# Patient Record
Sex: Male | Born: 1945
Health system: Southern US, Community
[De-identification: ages and names within clinical notes are randomized; demographics above are authoritative.]

## PROBLEM LIST (undated history)

## (undated) DIAGNOSIS — K859 Acute pancreatitis without necrosis or infection, unspecified: Secondary | ICD-10-CM

## (undated) DIAGNOSIS — D4959 Neoplasm of unspecified behavior of other genitourinary organ: Secondary | ICD-10-CM

## (undated) DIAGNOSIS — C6290 Malignant neoplasm of unspecified testis, unspecified whether descended or undescended: Secondary | ICD-10-CM

## (undated) DIAGNOSIS — C439 Malignant melanoma of skin, unspecified: Secondary | ICD-10-CM

## (undated) HISTORY — DX: Malignant neoplasm of unspecified testis, unspecified whether descended or undescended: C62.90

## (undated) HISTORY — DX: Malignant melanoma of skin, unspecified: C43.9

## (undated) HISTORY — DX: Acute pancreatitis without necrosis or infection, unspecified: K85.90

---
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
## 1977-07-28 HISTORY — PX: LYMPH GLAND EXCISION: SHX13

## 1977-07-28 HISTORY — PX: OTHER SURGICAL HISTORY: SHX169

## 1999-07-29 HISTORY — PX: ORCHIECTOMY: SHX2116

## 2003-11-01 HISTORY — PX: COLONOSCOPY: SHX174

## 2009-07-28 DIAGNOSIS — C6290 Malignant neoplasm of unspecified testis, unspecified whether descended or undescended: Secondary | ICD-10-CM

## 2009-07-28 HISTORY — DX: Malignant neoplasm of unspecified testis, unspecified whether descended or undescended: C62.90

## 2012-09-06 ENCOUNTER — Ambulatory Visit (INDEPENDENT_AMBULATORY_CARE_PROVIDER_SITE_OTHER): Payer: BC Managed Care – PPO | Admitting: Family

## 2012-09-06 ENCOUNTER — Telehealth: Payer: Self-pay | Admitting: Family

## 2012-09-06 ENCOUNTER — Encounter: Payer: Self-pay | Admitting: Family

## 2012-09-06 VITALS — BP 136/90 | HR 65 | Temp 98.5°F | Resp 16 | Ht 67.5 in | Wt 222.0 lb

## 2012-09-06 DIAGNOSIS — Z0000 Encounter for general adult medical examination without abnormal findings: Secondary | ICD-10-CM

## 2012-09-06 DIAGNOSIS — H9209 Otalgia, unspecified ear: Secondary | ICD-10-CM

## 2012-09-06 DIAGNOSIS — Z8547 Personal history of malignant neoplasm of testis: Secondary | ICD-10-CM | POA: Insufficient documentation

## 2012-09-06 DIAGNOSIS — H9201 Otalgia, right ear: Secondary | ICD-10-CM

## 2012-09-06 DIAGNOSIS — E291 Testicular hypofunction: Secondary | ICD-10-CM | POA: Insufficient documentation

## 2012-09-06 DIAGNOSIS — Z85820 Personal history of malignant melanoma of skin: Secondary | ICD-10-CM | POA: Insufficient documentation

## 2012-09-06 NOTE — Progress Notes (Signed)
Subjective:    Patient ID: Stephen Evans, male    DOB: Jul 16, 1946, 67 y.o.   MRN: [id number]  HPI  Reports hx of right ear pain- had work up including dental and Neuro (Dr. Logan Bores).  Did a CT scan at that time.  Was placed on Nambumetone (18 mos ago)  Still gets the ear pain- improved with heat.  Does this twice a week. He saw also saw an ENT who said that that they could not find anything.    Some trouble staying asleep.  Restless sleep.  + snoring.  He sometimes sleeps in another bedroom. Reports that he drinks 5 cups of coffee, but not after 10 AM.  Drinks wine with dinner.    Melanoma-  Reports that he had melanoma on the right back in the late 70's.  He had a wide excision- level 3.  He had lymph nodes removed beneath the right arm 2 months earlier.  LN's were negative.  Never had radiation or chemo from that.  Last derm apt was 4 yrs ago.    Testicular cancer- Noted discomfort of his testicle.  R orchiectomy. This was followed by a course of radiation in 2011.  Urologist did follow up CT scans and pt was told that he should be followed up in 5 yrs.    Androgen deficiency- has been off androgel x 3 years due to insurance.  [facility].     Review of Systems  Constitutional:       Reports 6-7 pound weight gain which he attributes to less exercise  HENT: Positive for ear pain.   Eyes: Negative for visual disturbance.  Respiratory: Negative for shortness of breath.   Cardiovascular: Negative for chest pain and leg swelling.  Gastrointestinal: Negative for nausea, vomiting and diarrhea.       Occasional constipation- uses fiber  Genitourinary: Negative for dysuria, scrotal swelling and testicular pain.  Neurological: Negative for headaches.  Hematological: Negative for adenopathy.  Psychiatric/Behavioral:       Denies depression or anxiety       Past Medical History  Diagnosis Date  . Testicular cancer 2011    testicular--radiation  . Melanoma 1979    History   Social History   . Marital Status: Married    Spouse Name: N/A    Number of Children: N/A  . Years of Education: N/A   Occupational History  . Not on file.   Social History Main Topics  . Smoking status: Former Smoker -- 30 years    Types: Cigarettes    Quit date: 09/06/1997  . Smokeless tobacco: Never Used  . Alcohol Use: 4.2 oz/week    7 Glasses of wine per week  . Drug Use: Not on file  . Sexually Active: Not on file   Other Topics Concern  . 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 Tonsillectomy  1959  . Lymph gland excision Right 1979    axillary, benign    Family History  Problem Relation Age of Onset  . Epilepsy Mother   . Cancer Father     esophageal- smoker/drinker died  at 34  . Cancer Sister     breast diagnosed at 47    No Known Allergies  No current outpatient prescriptions on file prior to visit.   No current facility-administered medications on file prior to visit.    BP 136/90  Pulse 65  Temp(Src) 98.5 F (36.9 C) (Oral)  Resp 16  Ht 5' 7.5" (1.715 m)  Wt 222 lb (100.699 kg)  BMI 34.24 kg/m2  SpO2 96%    Objective:   Physical Exam  Constitutional: He is oriented to person, place, and time. He appears well-developed and well-nourished. No distress.  HENT:  Head: Normocephalic and atraumatic.  Right Ear: Tympanic membrane and ear canal normal.  Left Ear: Tympanic membrane and ear canal normal.  Mouth/Throat: No oropharyngeal exudate, posterior oropharyngeal edema or posterior oropharyngeal erythema.  Cardiovascular: Normal rate and regular rhythm.   No murmur heard. Pulmonary/Chest: Effort normal and breath sounds normal. No  respiratory distress. He has no wheezes. He has no rales. He exhibits no tenderness.  Musculoskeletal: He exhibits no edema.  Lymphadenopathy:    He has no cervical adenopathy.  Neurological: He is alert and oriented to person, place, and time.  Skin: Skin is warm and dry.  Psychiatric: He has a normal mood and affect. His behavior is normal. Judgment and thought content normal.          Assessment & Plan:

## 2012-09-06 NOTE — Assessment & Plan Note (Signed)
Will request records from urology.

## 2012-09-06 NOTE — Assessment & Plan Note (Signed)
I recommended referral to dermatology for surveillance. He would like to go to the dermatologist who sees his wife and will call me with the name so that I can place referral.

## 2012-09-06 NOTE — Assessment & Plan Note (Signed)
He has had chronic intermittent right ear pain and extensive work up.  He declines further work up at this time.

## 2012-09-06 NOTE — Patient Instructions (Addendum)
You will be contacted about your referral for home sleep study.  Please let us know if you have not heard back within 1 week about your referral. Call me with the name of there dermatologist.you wish to be referred to. Return fasting to the lab for lab work. Schedule physical at the front desk. Welcome to [facility]!

## 2012-09-06 NOTE — Telephone Encounter (Signed)
Patient called in stating that he has changed his mind about the home sleep study. He does not want to proceed with referral at this time and wants to talk more about sleep study at next visit.

## 2012-09-06 NOTE — Assessment & Plan Note (Signed)
He will return fasting for blood work including testosterone.

## 2012-09-15 LAB — BASIC METABOLIC PANEL WITH GFR
BUN: 16 mg/dL (ref 6–23)
Chloride: 105 mEq/L (ref 96–112)
Creat: 0.93 mg/dL (ref 0.50–1.35)
GFR, Est African American: >89 mL/min
GFR, Est Non African American: 86 mL/min
Potassium: 4.4 mEq/L (ref 3.5–5.3)

## 2012-09-15 LAB — HEPATIC FUNCTION PANEL
Albumin: 4.6 g/dL (ref 3.5–5.2)
Alkaline Phosphatase: 69 U/L (ref 39–117)
Indirect Bilirubin: 0.5 mg/dL (ref 0.0–0.9)
Total Bilirubin: 0.6 mg/dL (ref 0.3–1.2)
Total Protein: 7 g/dL (ref 6.0–8.3)

## 2012-09-15 LAB — CBC WITH DIFFERENTIAL/PLATELET
Basophils Absolute: 0 10*3/uL (ref 0.0–0.1)
Basophils Relative: 1 % (ref 0–1)
Eosinophils Absolute: 0.3 10*3/uL (ref 0.0–0.7)
Eosinophils Relative: 5 % (ref 0–5)
HCT: 44 % (ref 39.0–52.0)
Hemoglobin: 15.7 g/dL (ref 13.0–17.0)
MCH: 30.3 pg (ref 26.0–34.0)
MCHC: 35.7 g/dL (ref 30.0–36.0)
MCV: 84.9 fL (ref 78.0–100.0)
Monocytes Absolute: 0.5 10*3/uL (ref 0.1–1.0)
Monocytes Relative: 7 % (ref 3–12)
RDW: 13.3 % (ref 11.5–15.5)

## 2012-09-15 LAB — PSA: PSA: 0.94 ng/mL (ref <=4.00)

## 2012-09-15 LAB — LIPID PANEL
HDL: 64 mg/dL (ref >39)
Total CHOL/HDL Ratio: 3.4 Ratio
Triglycerides: 120 mg/dL (ref <150)

## 2012-09-16 LAB — URINALYSIS, ROUTINE W REFLEX MICROSCOPIC
Bilirubin Urine: NEGATIVE
Glucose, UA: NEGATIVE mg/dL
Hgb urine dipstick: NEGATIVE
Ketones, ur: NEGATIVE mg/dL
Protein, ur: NEGATIVE mg/dL

## 2012-09-16 LAB — TESTOSTERONE, FREE, TOTAL, SHBG
Testosterone, Free: 37.1 pg/mL — ABNORMAL LOW (ref 47.0–244.0)
Testosterone-% Free: 1.7 % (ref 1.6–2.9)

## 2012-09-20 ENCOUNTER — Other Ambulatory Visit: Payer: Self-pay | Admitting: Family

## 2012-09-20 NOTE — Telephone Encounter (Addendum)
Pls call pt and let him know that I reviewed his lab work.  Testosterone is low.  I have pended rx for axiron.  He can start this for low T and apply one pump under each arm pit daily in the morning after he applies deodorant.  Repeat testosterone level in 1 month. He will need repeat PSA in 3 months.  Cholesterol a little high- work on low cholesterol diet. PSA, kidney function, thyroid count are normal.

## 2012-09-22 ENCOUNTER — Telehealth: Payer: Self-pay | Admitting: Internal Medicine

## 2012-09-22 ENCOUNTER — Telehealth: Payer: Self-pay | Admitting: *Deleted

## 2012-09-22 ENCOUNTER — Ambulatory Visit (INDEPENDENT_AMBULATORY_CARE_PROVIDER_SITE_OTHER): Payer: BC Managed Care – PPO | Admitting: Family

## 2012-09-22 ENCOUNTER — Encounter: Payer: Self-pay | Admitting: Family

## 2012-09-22 VITALS — BP 128/80 | HR 64 | Temp 97.7°F | Resp 16 | Ht 67.5 in | Wt 223.0 lb

## 2012-09-22 DIAGNOSIS — Z0000 Encounter for general adult medical examination without abnormal findings: Secondary | ICD-10-CM | POA: Insufficient documentation

## 2012-09-22 DIAGNOSIS — R9431 Abnormal electrocardiogram [ECG] [EKG]: Secondary | ICD-10-CM | POA: Insufficient documentation

## 2012-09-22 DIAGNOSIS — Z85820 Personal history of malignant melanoma of skin: Secondary | ICD-10-CM

## 2012-09-22 DIAGNOSIS — E291 Testicular hypofunction: Secondary | ICD-10-CM

## 2012-09-22 NOTE — Telephone Encounter (Signed)
Pt wanted to let you know that he cancelled his sleep study because he does not think he would follow through with the treatment if he does have sleep apnea.

## 2012-09-22 NOTE — Progress Notes (Signed)
Subjective:    Patient ID: Stephen Evans, male    DOB: 03/15/1946, 67 y.o.   MRN: [id number]  HPI  Patient presents today for complete physical.  Immunizations:Due for tetanus Diet: reports that diet is fair.  Eats too much cheese and peanut butter.   Exercise: he walks golf course 2-3 times a week.  Walks 60-90 minutes on sundays.  Colonoscopy: Last colo was 7 yrs ago.  Reports that it was normal.   Review of Systems  Constitutional: Negative for unexpected weight change.  HENT: Negative for congestion.   Respiratory: Negative for cough.   Cardiovascular: Negative for leg swelling.  Gastrointestinal: Negative for diarrhea and constipation.  Genitourinary:       Denies nocturia  Musculoskeletal: Negative for back pain.  Skin: Negative for rash.  Neurological: Negative for headaches.  Psychiatric/Behavioral:       Denies anxiety/depression   Past Medical History  Diagnosis Date  . Testicular cancer 2011    testicular--radiation  . Melanoma 1979    History   Social History  . Marital Status: Married    Spouse Name: N/A    Number of Children: N/A  . Years of Education: N/A   Occupational History  . Not on file.   Social History Main Topics  . Smoking status: Former Smoker -- 30 years    Types: Cigarettes    Quit date: 09/06/1997  . Smokeless tobacco: Never Used  . Alcohol Use: 4.2 oz/week    7 Glasses of wine per week  . Drug Use: Not on file  . Sexually Active: Not on file   Other Topics Concern  . 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 Tonsillectomy  1959  . Lymph gland excision Right 1979    axillary, benign  . Orchiectomy  2011     followed by radiation  . Wide excision of melanoma on back  1979    Family History  Problem Relation Age of Onset  . Epilepsy Mother   . Cancer Father     esophageal- smoker/drinker died at 10  . Cancer Sister     breast diagnosed at 36    No Known Allergies  No current outpatient prescriptions on file prior to visit.   No current facility-administered medications on file prior to visit.    BP 128/80  Pulse 64  Temp(Src) 97.7 F (36.5 C) (Oral)  Resp 16  Ht 5' 7.5" (1.715 m)  Wt 223 lb (101.152 kg)  BMI 34.39 kg/m2  SpO2 98%        Objective:   Physical Exam  Physical Exam  Constitutional: He is oriented to person, place, and time. He appears well-developed and well-nourished. No distress.  HENT:  Head: Normocephalic and atraumatic.  Right Ear: Tympanic membrane and ear canal normal.  Left Ear: Tympanic membrane and ear canal normal.  Mouth/Throat: Oropharynx is clear and moist.  Eyes: Pupils are equal, round, and reactive to light. No scleral icterus.  Neck: Normal range of motion. No thyromegaly present.  Cardiovascular: Normal rate and regular rhythm.   No murmur heard. Pulmonary/Chest: Effort normal and  breath sounds normal. No respiratory distress. He has no wheezes. He has no rales. He exhibits no tenderness.  Abdominal: Soft. Bowel sounds are normal. He exhibits no distension and no mass. There is no tenderness. There is no rebound and no guarding.  Musculoskeletal: He exhibits no edema.  Lymphadenopathy:    He has no cervical adenopathy.  Neurological: He is alert and oriented to person, place, and time.  He exhibits normal muscle tone. Coordination normal.  Skin: Skin is warm and dry.  Psychiatric: He has a normal mood and affect. His behavior is normal. Judgment and thought content normal.  GU: prostate normal size, no masses no asymmetry noted.  + external hemorrhoids noted.  Stool is heme negative.         Assessment & Plan:          Assessment & Plan:

## 2012-09-22 NOTE — Telephone Encounter (Signed)
Called the number back and left message Dr Johney Frame reviewed EKG and it looks good.  I have left message to call me at [phone]

## 2012-09-22 NOTE — Assessment & Plan Note (Signed)
Pt would like to be referred to [facility]- Bufford Buttner.  Will refer for surveillance.

## 2012-09-22 NOTE — Assessment & Plan Note (Signed)
?   Pause noted on EKG today.   Will review with Dr. Johney Frame- cardiologist on call today.

## 2012-09-22 NOTE — Telephone Encounter (Signed)
Stephen Evans wants dod dr allred to read ekg in epic, and call her [phone]

## 2012-09-22 NOTE — Assessment & Plan Note (Signed)
Testosterone is low.  We discussed risks/benefits of testosterone therapy today and he wishes not to start testosterone at this time.

## 2012-09-22 NOTE — Telephone Encounter (Signed)
DISCUSSED AT OFFICE VISIT TODAY WITH MELISSA O'SULLIVAN. PATIENT DECLINES AXIRON MEDICATION AT THIS TIME .

## 2012-09-22 NOTE — Patient Instructions (Addendum)
Return to lab in 6 months for follow up testosterone level. You will be contacted about your referral to dermatology.  Please let us know if you have not heard back within 1 week about your referral. Please follow up in 1 year, sooner if problems or concerns.

## 2012-09-22 NOTE — Assessment & Plan Note (Signed)
Continue healthy diet/exercise. We discussed importance of weight loss with goal bmi of <25. Per pt colo is up to date.  Await records from his former provider.  Lab work reviewed, cholesterol is mildly elevated and we discussed low cholesterol diet.

## 2012-09-23 ENCOUNTER — Telehealth: Payer: Self-pay | Admitting: Family

## 2012-09-23 NOTE — Telephone Encounter (Signed)
See message.

## 2012-10-26 ENCOUNTER — Telehealth: Payer: Self-pay | Admitting: Family

## 2012-10-26 NOTE — Telephone Encounter (Signed)
Received medical records from [facility]

## 2012-10-28 ENCOUNTER — Telehealth: Payer: Self-pay | Admitting: Family

## 2012-10-28 NOTE — Telephone Encounter (Signed)
pls call pt and let him know that I reviewed office note from [facility]rology in [place]. I think he should establish with local urologist and have pended referral below.

## 2012-10-29 NOTE — Telephone Encounter (Signed)
Notified pt. He would like to know why you think he should establish with one here? Is there any urgency?

## 2012-10-29 NOTE — Telephone Encounter (Signed)
Notified pt and he states he will research some local urologists and will let us know which one he wants to see. Provider is aware.

## 2012-10-29 NOTE — Telephone Encounter (Signed)
No urgency, the office note that was sent to me indicated that they wanted him back in 1 year.

## 2012-11-02 NOTE — Telephone Encounter (Signed)
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 New to the area to establish. pls evaluate and treat).

## 2012-11-09 ENCOUNTER — Telehealth: Payer: Self-pay | Admitting: Family

## 2012-11-09 NOTE — Telephone Encounter (Signed)
Received medical records from Dr. Alexandria Lodge: [phone] F: [phone]

## 2012-11-11 ENCOUNTER — Encounter: Payer: Self-pay | Admitting: Family

## 2012-11-15 ENCOUNTER — Encounter: Payer: Self-pay | Admitting: Family

## 2013-06-02 ENCOUNTER — Other Ambulatory Visit: Payer: Self-pay

## 2014-03-28 HISTORY — PX: MELANOMA EXCISION: SHX5266

## 2014-04-05 ENCOUNTER — Encounter: Payer: Self-pay | Admitting: Medical

## 2014-04-05 ENCOUNTER — Ambulatory Visit (INDEPENDENT_AMBULATORY_CARE_PROVIDER_SITE_OTHER): Payer: Medicare HMO | Admitting: Medical

## 2014-04-05 VITALS — BP 143/84 | HR 75 | Temp 98.8°F | Ht 67.5 in | Wt 216.4 lb

## 2014-04-05 DIAGNOSIS — S46812A Strain of other muscles, fascia and tendons at shoulder and upper arm level, left arm, initial encounter: Secondary | ICD-10-CM

## 2014-04-05 DIAGNOSIS — S46819A Strain of other muscles, fascia and tendons at shoulder and upper arm level, unspecified arm, initial encounter: Secondary | ICD-10-CM | POA: Insufficient documentation

## 2014-04-05 DIAGNOSIS — S43499A Other sprain of unspecified shoulder joint, initial encounter: Secondary | ICD-10-CM

## 2014-04-05 MED ORDER — CYCLOBENZAPRINE HCL 5 MG PO TABS: 5.0000 mg | ORAL_TABLET | Freq: Every day | ORAL | Status: DC

## 2014-04-05 MED ORDER — TRAMADOL HCL 50 MG PO TABS: 50.0000 mg | ORAL_TABLET | Freq: Four times a day (QID) | ORAL | Status: DC | PRN

## 2014-04-05 MED ORDER — DICLOFENAC SODIUM 75 MG PO TBEC
75.0000 mg | DELAYED_RELEASE_TABLET | Freq: Two times a day (BID) | ORAL | Status: DC
Start: 2014-04-05 — End: 2014-08-16

## 2014-04-05 NOTE — Assessment & Plan Note (Signed)
Advised warm compresses twice daily. Start diclofenac and cyclobenzaprene. See avs  Instruction regarding when to start tramadol. Will refer pt to PT.

## 2014-04-05 NOTE — Patient Instructions (Signed)
You appear to hav lelt sided trapezius strain near the upper edge of the scapula. I prescribed diclofenac anti-inflammatory and a muscle relaxant. These were sent to your pharmacy. Stop all otc nsaids. I also prescribed tramadol for moderate to severe pain but see first what your dermatologist prescribes for your skin procedure. If medication is stronger then start tramadol early next week.(Don't use both together.) I am going to go ahead and refer you to PT. Follow up in 7-10 days or as needed.

## 2014-04-05 NOTE — Progress Notes (Signed)
   Subjective:    Patient ID: Stephen Evans, male    DOB: [birth year], 68 y.o.   MRN: [id number]  HPI  Pt states he was reaching down stairs in a very awkward position.He states he was on his knees and his arms were extended down 2 steps. A lot of pressure on upper back. Then he states later  upper left side of back started to hurt and this was 3 wks ago. Also some pain in his lt upper extremity. But no mid cervical pain. He states small area in lt upper back that will hurt and send faint sharp pain toward lt bicep area  Pt went to chiropracter over past 3 wks. Playing golf no pain. Pain at times severe. Today level 9/10 pain. Presently level 3 pain. He states pain more noticeable at night. No chest pain. No sob.  Pt has tried otc gel and patch. Did not help much at all.  Pt cancelled chiropracter appointment since not getting beter.    Review of Systems  Constitutional: Negative for fever, chills, diaphoresis and fatigue.  Respiratory: Negative for cough, chest tightness, shortness of breath and wheezing.   Cardiovascular: Negative for chest pain and palpitations.  Gastrointestinal: Negative.   Musculoskeletal: Positive for back pain.       Left upper back region pain trapezius region upper border of scapula. No mid cspine pain. Some pain radiating to left upper bicep region.(Rare mild and transient)  Neurological: Negative.   Hematological: Negative for adenopathy. Does not bruise/bleed easily.       Objective:   Physical Exam   General- No acute distress, pleasant pt.  Neck- From, No mid cspine tenderness to palpation. Lungs-CTA. Heart- RRR Skin- no abnormality in upper left back region. No warmth or rash. Back- lt side Upper edge of scapula and trapezius has point tender area that hurts directly to palpation. Pain moderate to direct palpation.       Assessment & Plan:

## 2014-04-26 ENCOUNTER — Ambulatory Visit: Payer: Medicare HMO | Attending: Physical Therapy | Admitting: Physical Therapy

## 2014-04-26 DIAGNOSIS — IMO0001 Reserved for inherently not codable concepts without codable children: Secondary | ICD-10-CM | POA: Diagnosis present

## 2014-04-26 DIAGNOSIS — M542 Cervicalgia: Secondary | ICD-10-CM | POA: Insufficient documentation

## 2014-04-26 DIAGNOSIS — S46819A Strain of other muscles, fascia and tendons at shoulder and upper arm level, unspecified arm, initial encounter: Secondary | ICD-10-CM

## 2014-04-26 DIAGNOSIS — S43499A Other sprain of unspecified shoulder joint, initial encounter: Secondary | ICD-10-CM | POA: Insufficient documentation

## 2014-04-26 DIAGNOSIS — Y929 Unspecified place or not applicable: Secondary | ICD-10-CM | POA: Diagnosis not present

## 2014-04-26 DIAGNOSIS — X58XXXA Exposure to other specified factors, initial encounter: Secondary | ICD-10-CM | POA: Insufficient documentation

## 2014-04-26 DIAGNOSIS — Z8547 Personal history of malignant neoplasm of testis: Secondary | ICD-10-CM | POA: Diagnosis not present

## 2014-04-26 DIAGNOSIS — Z85828 Personal history of other malignant neoplasm of skin: Secondary | ICD-10-CM | POA: Insufficient documentation

## 2014-04-26 DIAGNOSIS — M2180 Other specified acquired deformities of unspecified limb: Secondary | ICD-10-CM | POA: Insufficient documentation

## 2014-05-04 ENCOUNTER — Ambulatory Visit: Payer: Medicare HMO | Admitting: Rehabilitation

## 2014-05-08 ENCOUNTER — Ambulatory Visit: Payer: Medicare HMO | Admitting: Rehabilitation

## 2014-05-10 ENCOUNTER — Ambulatory Visit: Payer: Medicare HMO | Admitting: Rehabilitation

## 2014-05-15 ENCOUNTER — Encounter: Payer: Medicare HMO | Admitting: Rehabilitation

## 2014-05-18 ENCOUNTER — Ambulatory Visit: Payer: Medicare HMO | Admitting: Physical Therapy

## 2014-08-04 ENCOUNTER — Encounter: Payer: Self-pay | Admitting: Family

## 2014-08-16 ENCOUNTER — Ambulatory Visit (INDEPENDENT_AMBULATORY_CARE_PROVIDER_SITE_OTHER): Payer: Medicare HMO | Admitting: Family

## 2014-08-16 ENCOUNTER — Encounter: Payer: Self-pay | Admitting: Family

## 2014-08-16 VITALS — BP 136/72 | HR 61 | Temp 98.2°F | Resp 16 | Ht 68.0 in | Wt 219.2 lb

## 2014-08-16 DIAGNOSIS — Z0000 Encounter for general adult medical examination without abnormal findings: Secondary | ICD-10-CM

## 2014-08-16 DIAGNOSIS — E291 Testicular hypofunction: Secondary | ICD-10-CM

## 2014-08-16 DIAGNOSIS — N529 Male erectile dysfunction, unspecified: Secondary | ICD-10-CM

## 2014-08-16 LAB — LIPID PANEL
Cholesterol: 188 mg/dL (ref 0–200)
HDL: 62.3 mg/dL (ref >39.00)
LDL Cholesterol: 99 mg/dL (ref 0–99)
NonHDL: 125.7
Total CHOL/HDL Ratio: 3
Triglycerides: 134 mg/dL (ref 0.0–149.0)
VLDL: 26.8 mg/dL (ref 0.0–40.0)

## 2014-08-16 MED ORDER — SILDENAFIL CITRATE 50 MG PO TABS: 50.0000 mg | ORAL_TABLET | Freq: Every day | ORAL | Status: DC | PRN

## 2014-08-16 NOTE — Progress Notes (Signed)
Pre visit review using our clinic review tool, if applicable. No additional management support is needed unless otherwise documented below in the visit note. 

## 2014-08-16 NOTE — Patient Instructions (Signed)
Please complete lab work prior to leaving. You will be contacted about your colonoscopy. Start viagra as needed. Follow up in 6 months.

## 2014-08-16 NOTE — Progress Notes (Signed)
Subjective:    Patient ID: Stephen Evans, male    DOB: 08-27-1945, 69 y.o.   MRN: [id number]  HPI  Subjective:    Stephen Evans is a 69 y.o. male who presents for Medicare Annual/Subsequent preventive examination.   Preventive Screening-Counseling & Management  Tobacco History  Smoking status  . Former Smoker -- 70 years  . Types: Cigarettes  . Quit date: 09/06/1997  Smokeless tobacco  . Never Used    Problems Prior to Visit 1. He has had trouble getting and maintaining an erection for last 5-6 months but no consistently. Is interested in his testosterone level.  Current Problems (verified) Patient Active Problem List   Diagnosis Date Noted  . Trapezius strain 04/05/2014  . Routine general medical examination at a health care facility 09/22/2012  . Nonspecific abnormal electrocardiogram (ECG) (EKG) 09/22/2012  . Personal history of malignant melanoma 09/06/2012  . History of testicular cancer 09/06/2012  . Androgen deficiency 09/06/2012    Medications Prior to Visit No current outpatient prescriptions on file prior to visit.   No current facility-administered medications on file prior to visit.    Current Medications (verified) No current outpatient prescriptions on file.   No current facility-administered medications for this visit.     Allergies (verified) Review of patient's allergies indicates no known allergies.   PAST HISTORY  Family History Family History  Problem Relation Age of Onset  . Epilepsy Mother   . Cancer Father     esophageal- smoker/drinker died at 68  . Cancer Sister     breast diagnosed at 50    Social History History  Substance Use Topics  . Smoking status: Former Smoker -- 30 years    Types: Cigarettes    Quit date: 09/06/1997  . Smokeless tobacco: Never Used  . Alcohol Use: 4.2 oz/week    7 Glasses of wine per week    Are there smokers in your home (other than you)?  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 Walks about 7-8 miles per round.  Dietary issues discussed: eats a balanced diet with a lot of vegetables.   Cardiac risk factors: advanced age (older than 2 for men, 75 for women), male gender and obesity (BMI >= 30 kg/m2).  Depression Screen (Note: if answer to either of the following is "Yes", a more complete depression screening is indicated)   Q1: Over the past two weeks, have you felt down, depressed or hopeless? No  Q2: Over the past two weeks, have you felt little interest or pleasure in doing things? No  Have you lost interest or pleasure in daily life? No  Do you often feel hopeless? No  Do you cry easily over simple problems? No  Activities of Daily Living In your present state of health, do you have any difficulty performing the following activities?:  Driving? no Managing money?  no Feeding yourself? no Getting from bed to chair? no Climbing a flight of stairs? no Preparing food and eating?: no Bathing or showering? no Getting dressed: no Getting to the toilet? no Using the toilet:No Moving around from place to place: No In the past year have you fallen or had a near fall?:No   Are you sexually active?  Yes  Do you have more than one partner?  No  Hearing Difficulties: No Do you often ask people to speak up or repeat themselves? No Do you experience ringing or  noises in your ears? No Do you have difficulty understanding soft or whispered voices? No   Do you feel that you have a problem with memory? No  Do you often misplace items? No  Do you feel safe at home?  Yes  Cognitive Testing  Alert? Yes  Normal Appearance?Yes  Oriented to person? Yes  Place? Yes   Time? Yes  Recall of three objects?  yes  Can perform simple calculations? Yes  Displays appropriate judgment?Yes  Can read the correct time from a watch face?Yes   Advanced Directives have been discussed with the patient? No   List the Names of  Other Physician/Practitioners you currently use:  1.  none  Indicate any recent Medical Services you may have received from other than [facility] providers in the past year (date may be approximate).  Immunization History  Administered Date(s) Administered  . Td 07/28/2008    Screening Tests Health Maintenance  Topic Date Due  . TETANUS/TDAP  05/05/1967  . ZOSTAVAX  05/04/2008  . PNEUMOCOCCAL POLYSACCHARIDE VACCINE AGE 19 AND OVER  05/04/2013  . COLONOSCOPY  10/31/2013  . INFLUENZA VACCINE  02/25/2014    All answers were reviewed with the patient and necessary referrals were made:  O'SULLIVAN,Pariss Hommes S., NP   08/16/2014   History reviewed: allergies, current medications, past family history, past medical history, past social history, past surgical history and problem list  Review of Systems Pertinent items are noted in HPI.    Objective:     Vision by Snellen chart: right eye:see nursing, left eye:see nurseing Blood pressure 136/72, pulse 61, temperature 98.2 F (36.8 C), temperature source Oral, resp. rate 16, height 5\' 8"  (1.727 m), weight 219 lb 3.2 oz (99.428 kg), SpO2 100 %. Body mass index is 33.34 kg/(m^2).  Physical Exam  Constitutional: He is oriented to person, place, and time. He appears well-developed and well-nourished. No distress.  HENT:  Head: Normocephalic and atraumatic.  Right Ear: Tympanic membrane and ear canal normal.  Left Ear: Tympanic membrane and ear canal normal.  Mouth/Throat: Oropharynx is clear and moist.  Eyes: Pupils are equal, round, and reactive to light. No scleral icterus.  Neck: Normal range of motion. No thyromegaly present.  Cardiovascular: Normal rate and regular rhythm.   No murmur heard. Pulmonary/Chest: Effort normal and breath sounds normal. No respiratory distress. He has no wheezes. He has no rales. He exhibits no tenderness.  Abdominal: Soft. Bowel sounds are normal. He exhibits no distension and no mass. There is no  tenderness. There is no rebound and no guarding.  Musculoskeletal: He exhibits no edema.  Lymphadenopathy:    He has no cervical adenopathy.  Neurological: He is alert and oriented to person, place, and time. He has normal reflexes. He exhibits normal muscle tone. Coordination normal.  Skin: Skin is warm and dry.  Psychiatric: He has a normal mood and affect. His behavior is normal. Judgment and thought content normal.          Assessment & Plan:        Assessment:     1. ED- trial of viagra  2.  Hypogonadism- repeat testosterone level is low. Pt is not sure that he wishes to pursue testosterone therapy.  He will think about it.       Plan:     During the course of the visit the patient was educated and counseled about appropriate screening and preventive services including:    Referral to GI for screening colo, obtain follow up  lipid panel, obtain screening US for AAA given former smoker history.    Diet review for nutrition referral? Yes ____  Not Indicated _x___   Patient Instructions (the written plan) was given to the patient.  Medicare Attestation I have personally reviewed: The patient's medical and social history Their use of alcohol, tobacco or illicit drugs Their current medications and supplements The patient's functional ability including ADLs,fall risks, home safety risks, cognitive, and hearing and visual impairment Diet and physical activities Evidence for depression or mood disorders  The patient's weight, height, BMI, and visual acuity have been recorded in the chart.  I have made referrals, counseling, and provided education to the patient based on review of the above and I have provided the patient with a written personalized care plan for preventive services.     O'SULLIVAN,Eliaz Fout S., NP   08/16/2014        Review of Systems  Constitutional: Negative for fever, chills, fatigue and unexpected weight change.  HENT: Negative for hearing  loss, nosebleeds, tinnitus and trouble swallowing.   Eyes: Negative for visual disturbance.       Has eye exam in a couple of weeks. Uses glasses for computer work.  Respiratory: Negative for cough, chest tightness, shortness of breath and wheezing.   Cardiovascular: Negative for chest pain, palpitations and leg swelling.  Gastrointestinal: Positive for constipation. Negative for nausea, vomiting, abdominal pain, diarrhea and blood in stool.       Has occasional constipation. Increases water intake when this happens.  Endocrine: Negative for cold intolerance, heat intolerance, polydipsia and polyuria.  Genitourinary: Negative for urgency, frequency and difficulty urinating.       Urinates slightly more often than he used.  Musculoskeletal: Negative for myalgias, back pain, gait problem and neck pain.       Some right knee pain-wears brace.  Skin: Negative for rash and wound.       Does not wear sunscreen.  Allergic/Immunologic: Negative for environmental allergies and food allergies.  Neurological: Negative for dizziness, tremors, seizures, speech difficulty, weakness, light-headedness, numbness and headaches.  Hematological: Negative for adenopathy.  Psychiatric/Behavioral:       Denies depression/anxiety. Wakes up some at night but can go back to sleep.  Does snore.                                                 Objective:   Physical Exam  Constitutional: He is oriented to person, place, and time. He appears well-developed and well-nourished. No distress.  HENT:  Head: Normocephalic and atraumatic.  Right Ear: Tympanic membrane normal.  Left Ear: Tympanic membrane normal.  Eyes: EOM are normal. Pupils are equal, round, and reactive to light. Right eye exhibits no discharge. Left eye exhibits no discharge. No scleral icterus.  Neck: Normal range of motion. Neck supple. No thyromegaly present.  Cardiovascular: Normal rate, regular rhythm  and normal heart sounds.  Exam reveals no gallop and no friction rub.   No murmur heard. Pulmonary/Chest: Effort normal and breath sounds normal.  Abdominal: Soft. Bowel sounds are normal. He exhibits no distension and no mass. There is no tenderness. There is no rebound and no guarding.  Musculoskeletal: He exhibits no edema.  Lymphadenopathy:    He has no cervical adenopathy.  Neurological: He is alert and oriented to person, place, and time. He has normal strength.  No cranial nerve deficit.  Reflex Scores:      Patellar reflexes are 2+ on the right side and 2+ on the left side. Skin: Skin is warm and dry. No rash noted. He is not diaphoretic.  Psychiatric: He has a normal mood and affect. His behavior is normal. Judgment and thought content normal.          Assessment & Plan:

## 2014-08-17 LAB — TESTOSTERONE, FREE, TOTAL, SHBG
SEX HORMONE BINDING: 41 nmol/L (ref 22–77)
TESTOSTERONE-% FREE: 1.6 % (ref 1.6–2.9)
Testosterone, Free: 30 pg/mL — ABNORMAL LOW (ref 47.0–244.0)
Testosterone: 182 ng/dL — ABNORMAL LOW (ref 300–890)

## 2014-08-19 ENCOUNTER — Encounter: Payer: Self-pay | Admitting: Family

## 2014-08-24 ENCOUNTER — Ambulatory Visit (HOSPITAL_BASED_OUTPATIENT_CLINIC_OR_DEPARTMENT_OTHER): Payer: Medicare HMO

## 2014-08-28 ENCOUNTER — Encounter: Payer: Self-pay | Admitting: Family

## 2014-08-28 DIAGNOSIS — Z0000 Encounter for general adult medical examination without abnormal findings: Secondary | ICD-10-CM

## 2014-08-30 ENCOUNTER — Ambulatory Visit (HOSPITAL_BASED_OUTPATIENT_CLINIC_OR_DEPARTMENT_OTHER)
Admission: RE | Admit: 2014-08-30 | Discharge: 2014-08-30 | Disposition: A | Discharge status: Home / Self Care | Payer: Medicare HMO | Source: Ambulatory Visit | Attending: Family | Admitting: Family

## 2014-08-30 DIAGNOSIS — I714 Abdominal aortic aneurysm, without rupture: Secondary | ICD-10-CM | POA: Diagnosis not present

## 2014-08-30 DIAGNOSIS — Z0000 Encounter for general adult medical examination without abnormal findings: Secondary | ICD-10-CM

## 2014-08-30 DIAGNOSIS — Z87891 Personal history of nicotine dependence: Secondary | ICD-10-CM | POA: Diagnosis not present

## 2014-08-30 DIAGNOSIS — Z1389 Encounter for screening for other disorder: Secondary | ICD-10-CM | POA: Insufficient documentation

## 2014-08-31 ENCOUNTER — Encounter: Payer: Self-pay | Admitting: Family

## 2015-05-21 DIAGNOSIS — Z008 Encounter for other general examination: Secondary | ICD-10-CM | POA: Diagnosis not present

## 2015-08-13 ENCOUNTER — Encounter: Payer: Self-pay | Admitting: Family

## 2015-08-14 NOTE — Telephone Encounter (Signed)
See mychart message. I would recommend a dulcolax suppository.  If still no luck by this evening you can purchase otc magnesium citrate and drink 1/2 bottle mag citrate.  I would like to see patient in the office as well this week please.

## 2015-08-15 DIAGNOSIS — R69 Illness, unspecified: Secondary | ICD-10-CM | POA: Diagnosis not present

## 2015-08-17 ENCOUNTER — Encounter: Payer: Self-pay | Admitting: Family

## 2015-08-17 ENCOUNTER — Encounter: Payer: Self-pay | Admitting: Gastroenterology

## 2015-08-17 ENCOUNTER — Ambulatory Visit (INDEPENDENT_AMBULATORY_CARE_PROVIDER_SITE_OTHER): Payer: Medicare HMO | Admitting: Family

## 2015-08-17 VITALS — BP 131/68 | HR 58 | Temp 98.4°F | Resp 16 | Ht 68.0 in | Wt 220.4 lb

## 2015-08-17 DIAGNOSIS — Z85820 Personal history of malignant melanoma of skin: Secondary | ICD-10-CM

## 2015-08-17 DIAGNOSIS — K921 Melena: Secondary | ICD-10-CM

## 2015-08-17 DIAGNOSIS — K5900 Constipation, unspecified: Secondary | ICD-10-CM | POA: Diagnosis not present

## 2015-08-17 DIAGNOSIS — E291 Testicular hypofunction: Secondary | ICD-10-CM | POA: Diagnosis not present

## 2015-08-17 DIAGNOSIS — Z23 Encounter for immunization: Secondary | ICD-10-CM

## 2015-08-17 LAB — CBC WITH DIFFERENTIAL/PLATELET
BASOS ABS: 0 10*3/uL (ref 0.0–0.1)
Basophils Relative: 0.8 % (ref 0.0–3.0)
Eosinophils Absolute: 0.2 10*3/uL (ref 0.0–0.7)
Eosinophils Relative: 3.7 % (ref 0.0–5.0)
HEMATOCRIT: 46.9 % (ref 39.0–52.0)
Hemoglobin: 15.5 g/dL (ref 13.0–17.0)
LYMPHS ABS: 1.9 10*3/uL (ref 0.7–4.0)
Lymphocytes Relative: 31.6 % (ref 12.0–46.0)
MCHC: 33 g/dL (ref 30.0–36.0)
MCV: 90.3 fl (ref 78.0–100.0)
MONO ABS: 0.4 10*3/uL (ref 0.1–1.0)
Monocytes Relative: 7.3 % (ref 3.0–12.0)
NEUTROS PCT: 56.6 % (ref 43.0–77.0)
Neutro Abs: 3.3 10*3/uL (ref 1.4–7.7)
PLATELETS: 287 10*3/uL (ref 150.0–400.0)
RBC: 5.19 Mil/uL (ref 4.22–5.81)
RDW: 12.5 % (ref 11.5–15.5)
WBC: 5.9 10*3/uL (ref 4.0–10.5)

## 2015-08-17 NOTE — Assessment & Plan Note (Signed)
Continue surveillance with dermatology

## 2015-08-17 NOTE — Addendum Note (Signed)
Addended by: Kelle Darting A on: 08/17/2015 01:15 PM   Modules accepted: Orders

## 2015-08-17 NOTE — Progress Notes (Signed)
Pre visit review using our clinic review tool, if applicable. No additional management support is needed unless otherwise documented below in the visit note. 

## 2015-08-17 NOTE — Progress Notes (Signed)
Subjective:    Patient ID: Stephen Evans, male    DOB: 14-Jun-1946, 70 y.o.   MRN: [id number]  HPI  Stephen Evans is a 70 yr old male who presents today for follow up.  ED/Hypogonadism-  Pt was given rx for trial of viagra last visit.  Feels like he is not needing.    Hypogonadism-  Declines testosterone therapy.  Hx of malignant melanoma- sees Dr. Dewain Penning at [facility] dermatology. Continues routine surveillance- goes every 6 monhts.    Constipation- pt reported an episode of bright red blood in stool.  Pt reports that he had constipation, took a stool softner on Saturday night. Only had a small BM. He reports that on Sunday he strained. He has hx of hemorrhoids.  Took a dulcolax Monday AM. Had a loose BM on Tuesday am. Took another dulcolax on Tuesday AM. Wednesday took another dulcolax and had a good BM.    Review of Systems See HPI  Past Medical History  Diagnosis Date  . Testicular cancer ([place]) 2011    testicular--radiation  . Melanoma ([place]) 1979    Social History   Social History  . Marital Status: Married    Spouse Name: N/A  . Number of Children: N/A  . Years of Education: N/A   Occupational History  . Not on file.   Social History Main Topics  . Smoking status: Former Smoker -- 30 years    Types: Cigarettes    Quit date: 09/06/1997  . Smokeless tobacco: Never Used  . Alcohol Use: 4.2 oz/week    7 Glasses of wine per week  . Drug Use: Not on file  . Sexual Activity: Not on file   Other Topics Concern  . Not on file   Social History Narrative   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[employer]   Married- second marriage x 37 yrs   82 yr old son- lives in [place], [place] manages a private hedge fund   52 yr old daughter- lives in [place], [place]   Enjoys golf    Past Surgical History  Procedure Laterality Date  . Tonsillectomy  1959  . Lymph gland excision Right 1979    axillary, benign  . Orchiectomy Right  2011    followed by radiation  . Wide excision of melanoma on back  1979  . Melanoma excision Right 03/2014    elbow    Family History  Problem Relation Age of Onset  . Epilepsy Mother   . Cancer Father     esophageal- smoker/drinker died at 67  . Cancer Sister     breast diagnosed at 74    No Known Allergies  No current outpatient prescriptions on file prior to visit.   No current facility-administered medications on file prior to visit.    BP 131/68 mmHg  Pulse 58  Temp(Src) 98.4 F (36.9 C) (Oral)  Resp 16  Ht 5\' 8"  (1.727 m)  Wt 220 lb 6.4 oz (99.973 kg)  BMI 33.52 kg/m2  SpO2 98%       Objective:   Physical Exam  Constitutional: He is oriented to person, place, and time. He appears well-developed and well-nourished. No distress.  HENT:  Head: Normocephalic and atraumatic.  Cardiovascular: Normal rate and regular rhythm.   No murmur heard. Pulmonary/Chest: Effort normal and breath sounds normal. No respiratory distress. He has no wheezes. He has no rales.  Abdominal: Soft. He exhibits no distension. There is  no tenderness. There is no rebound.  Musculoskeletal: He exhibits no edema.  Neurological: He is alert and oriented to person, place, and time.  Skin: Skin is warm and dry.  Psychiatric: He has a normal mood and affect. His behavior is normal. Thought content normal.          Assessment & Plan:  Constipation- advised patient to try to add more fresh fruits/veggies, increase water, add probiotic.  Bloody stool- x 1- obtain cbc, refer for colo. Suspect was secondary to hemorrhoids.

## 2015-08-17 NOTE — Patient Instructions (Signed)
Please complete lab work prior to leaving. You will be contacted about your referral for colonoscopy.   Please schedule medicare wellness at your convenience.

## 2015-08-17 NOTE — Assessment & Plan Note (Signed)
Declines testosterone therapy. Not using or needing viagra.

## 2015-08-19 ENCOUNTER — Encounter: Payer: Self-pay | Admitting: Family

## 2015-10-03 DIAGNOSIS — D1801 Hemangioma of skin and subcutaneous tissue: Secondary | ICD-10-CM | POA: Diagnosis not present

## 2015-10-03 DIAGNOSIS — L821 Other seborrheic keratosis: Secondary | ICD-10-CM | POA: Diagnosis not present

## 2015-10-03 DIAGNOSIS — Z85820 Personal history of malignant melanoma of skin: Secondary | ICD-10-CM | POA: Diagnosis not present

## 2015-10-03 DIAGNOSIS — L814 Other melanin hyperpigmentation: Secondary | ICD-10-CM | POA: Diagnosis not present

## 2015-10-03 DIAGNOSIS — D224 Melanocytic nevi of scalp and neck: Secondary | ICD-10-CM | POA: Diagnosis not present

## 2015-10-08 ENCOUNTER — Encounter: Payer: Self-pay | Admitting: Gastroenterology

## 2015-10-08 ENCOUNTER — Ambulatory Visit (INDEPENDENT_AMBULATORY_CARE_PROVIDER_SITE_OTHER): Payer: Medicare HMO | Admitting: Gastroenterology

## 2015-10-08 VITALS — BP 110/70 | HR 72 | Ht 67.0 in | Wt 221.1 lb

## 2015-10-08 DIAGNOSIS — K649 Unspecified hemorrhoids: Secondary | ICD-10-CM | POA: Diagnosis not present

## 2015-10-08 DIAGNOSIS — Z1211 Encounter for screening for malignant neoplasm of colon: Secondary | ICD-10-CM | POA: Diagnosis not present

## 2015-10-08 DIAGNOSIS — Z800 Family history of malignant neoplasm of digestive organs: Secondary | ICD-10-CM | POA: Diagnosis not present

## 2015-10-08 NOTE — Progress Notes (Signed)
HPI :  70 y/o male with a history of melanoma x 2 and testicular cancer, here for new patient evaluation for symptoms of blood in the stools and CRC screening. No FH of colon cancer. He reports a few years ago he had some constipation and in this setting had some associated rectal bleeding. He has since had some occasional straining and thinks he has some bleeding due to external hemorrhoids which have been present for years. Blood was only noted on the toilet paper and not in the stool or toilet. He thinks this has occurred over the years periodically. He states his bowel habits are a bit irregular. No abdominal pains otherwise. No weight loss. No perianal pain at all. He thinks his stool form has changed a bit over the years to softer than normal but remains formed, not loose. He has roughly 5-6 BMs per week. He doesn't take medications routinely. Father had esophageal cancer at age 57. He has no GERD. No dysphagia. He is a prior tobacco user, 30 years tobacco.  He otherwise reports having had an adverse reaction to anesthesia in the past and wishes to avoid anesthesia and any invasive procedures if possible.  Last colonoscopy 10/2003 - normal exam without polyps  Past Medical History  Diagnosis Date  . Testicular cancer ([place]) 2011    testicular--radiation  . Melanoma ([place]) x 2    Past Surgical History  Procedure Laterality Date  . Tonsillectomy  1959  . Lymph gland excision Right 1979    axillary, benign  . Orchiectomy Right 2011    followed by radiation  . Wide excision of melanoma on back  1979  . Melanoma excision Right 03/2014    elbow   Family History  Problem Relation Age of Onset  . Epilepsy Mother   . Esophageal cancer Father      smoker/drinker died at 74  . Breast cancer Sister      diagnosed at 91   Social History  Substance Use Topics  . Smoking status: Former Smoker -- 30 years    Types: Cigarettes    Quit date: 09/06/1997  . Smokeless tobacco: Never Used  .  Alcohol Use: 4.2 oz/week    7 Glasses of wine per week   Current Outpatient Prescriptions  Medication Sig Dispense Refill  . Probiotic Product (PROBIOTIC PO) Take 1 tablet by mouth daily.     No current facility-administered medications for this visit.   No Known Allergies   Review of Systems: All systems reviewed and negative except where noted in HPI.   Lab Results  Component Value Date   WBC 5.9 08/17/2015   HGB 15.5 08/17/2015   HCT 46.9 08/17/2015   MCV 90.3 08/17/2015   PLT 287.0 08/17/2015    Lab Results  Component Value Date   CREATININE 0.93 09/15/2012   BUN 16 09/15/2012   NA 141 09/15/2012   K 4.4 09/15/2012   CL 105 09/15/2012   CO2 23 09/15/2012   Lab Results  Component Value Date   ALT 19 09/15/2012   AST 15 09/15/2012   ALKPHOS 69 09/15/2012   BILITOT 0.6 09/15/2012     Physical Exam: BP 110/70 mmHg  Pulse 72  Ht 5\' 7"  (1.702 m)  Wt 221 lb 2 oz (100.302 kg)  BMI 34.63 kg/m2 Constitutional: Pleasant,well-developed, male in no acute distress. HEENT: Normocephalic and atraumatic. Conjunctivae are normal. No scleral icterus. Neck supple.  Cardiovascular: Normal rate, regular rhythm.  Pulmonary/chest: Effort normal and breath  sounds normal. No wheezing, rales or rhonchi. Abdominal: Soft, nondistended, nontender. Bowel sounds active throughout. There are no masses palpable. No hepatomegaly. Rectal exam: external skin tags, external hemorrhoid, normal DRE otherwise, no mass lesion Extremities: no edema Lymphadenopathy: No cervical adenopathy noted. Neurological: Alert and oriented to person place and time. Skin: Skin is warm and dry. No rashes noted. Psychiatric: Normal mood and affect. Behavior is normal.   ASSESSMENT AND PLAN: 70 y/o male presenting for colon cancer screening, who has intermittent rare scant rectal bleeding most likely due to hemorrhoids, who also has a FH of esophageal cancer   CRC screening - given the patient's history of  2 other malignancies and his history of rectal bleeding, I think an optical colonoscopy is the most appropriate screening modality for him. However, he had a poor reaction to anesthesia on multiple occasions in the past, one he thinks requiring intubation, and wishes to avoid anesthesia if possible. He is asking for a Cologuard in this light, and if it's positive he would be agreeable to a colonoscopy. Given his reaction to sedation in the past, this is reasonable. Will order Cologuard and if positive he will proceed with Colonoscopy.   Hemorrhoids - the most likely cause of his symptoms, appreciated on DRE. Recommend daily fiber supplement. Symptoms of bleeding are rare. If it worsens he can follow up PRN for banding and endoscopic evaluation.   FH of esophageal cancer - given his personal history of multiple cancers and tobacco use, as well as his father's history of esophageal cancer at age roughly 66,  I offered him a screening EGD, despite lack of symptoms. He declined for now after a discussion of this issue and the exam, mostly due to his fear of sedation, but will let me know if he wishes to have screening in the future.   Murray Cellar, MD [facility] Pager [phone]

## 2015-10-08 NOTE — Patient Instructions (Signed)
We have sent your demographic and insurance information to [facility]. They should contact you within the next week regarding your Cologuard (colon cancer screening) test. If you have not heard from them within the next week, please call our office at [phone]. 

## 2015-10-16 DIAGNOSIS — Z1212 Encounter for screening for malignant neoplasm of rectum: Secondary | ICD-10-CM | POA: Diagnosis not present

## 2015-10-16 DIAGNOSIS — Z1211 Encounter for screening for malignant neoplasm of colon: Secondary | ICD-10-CM | POA: Diagnosis not present

## 2015-10-30 ENCOUNTER — Other Ambulatory Visit: Payer: Self-pay

## 2015-10-30 LAB — COLOGUARD: Cologuard: NEGATIVE

## 2016-02-19 DIAGNOSIS — R69 Illness, unspecified: Secondary | ICD-10-CM | POA: Diagnosis not present

## 2016-05-16 DIAGNOSIS — L918 Other hypertrophic disorders of the skin: Secondary | ICD-10-CM | POA: Diagnosis not present

## 2016-05-16 DIAGNOSIS — D220 Melanocytic nevi of lip: Secondary | ICD-10-CM | POA: Diagnosis not present

## 2016-05-16 DIAGNOSIS — Z85820 Personal history of malignant melanoma of skin: Secondary | ICD-10-CM | POA: Diagnosis not present

## 2016-05-16 DIAGNOSIS — D224 Melanocytic nevi of scalp and neck: Secondary | ICD-10-CM | POA: Diagnosis not present

## 2016-05-16 DIAGNOSIS — L720 Epidermal cyst: Secondary | ICD-10-CM | POA: Diagnosis not present

## 2016-05-16 DIAGNOSIS — D1801 Hemangioma of skin and subcutaneous tissue: Secondary | ICD-10-CM | POA: Diagnosis not present

## 2016-05-16 DIAGNOSIS — D2239 Melanocytic nevi of other parts of face: Secondary | ICD-10-CM | POA: Diagnosis not present

## 2016-05-16 DIAGNOSIS — D485 Neoplasm of uncertain behavior of skin: Secondary | ICD-10-CM | POA: Diagnosis not present

## 2016-05-16 DIAGNOSIS — L821 Other seborrheic keratosis: Secondary | ICD-10-CM | POA: Diagnosis not present

## 2016-05-16 DIAGNOSIS — L814 Other melanin hyperpigmentation: Secondary | ICD-10-CM | POA: Diagnosis not present

## 2016-06-11 DIAGNOSIS — R69 Illness, unspecified: Secondary | ICD-10-CM | POA: Diagnosis not present

## 2016-08-28 DIAGNOSIS — R69 Illness, unspecified: Secondary | ICD-10-CM | POA: Diagnosis not present

## 2016-10-06 ENCOUNTER — Telehealth: Payer: Self-pay | Admitting: Family

## 2016-10-06 ENCOUNTER — Ambulatory Visit (HOSPITAL_BASED_OUTPATIENT_CLINIC_OR_DEPARTMENT_OTHER)
Admission: RE | Admit: 2016-10-06 | Discharge: 2016-10-06 | Disposition: A | Discharge status: Home / Self Care | Payer: Medicare HMO | Source: Ambulatory Visit | Attending: Family | Admitting: Family

## 2016-10-06 ENCOUNTER — Ambulatory Visit (INDEPENDENT_AMBULATORY_CARE_PROVIDER_SITE_OTHER): Payer: Medicare HMO | Admitting: Family

## 2016-10-06 ENCOUNTER — Encounter: Payer: Self-pay | Admitting: Family

## 2016-10-06 VITALS — BP 136/75 | HR 62 | Temp 98.2°F | Resp 16 | Ht 67.0 in | Wt 224.8 lb

## 2016-10-06 DIAGNOSIS — M898X9 Other specified disorders of bone, unspecified site: Secondary | ICD-10-CM

## 2016-10-06 DIAGNOSIS — M5136 Other intervertebral disc degeneration, lumbar region: Secondary | ICD-10-CM | POA: Insufficient documentation

## 2016-10-06 DIAGNOSIS — M546 Pain in thoracic spine: Secondary | ICD-10-CM | POA: Diagnosis not present

## 2016-10-06 DIAGNOSIS — I700 Atherosclerosis of aorta: Secondary | ICD-10-CM | POA: Diagnosis not present

## 2016-10-06 DIAGNOSIS — M545 Low back pain: Secondary | ICD-10-CM | POA: Diagnosis not present

## 2016-10-06 DIAGNOSIS — M5134 Other intervertebral disc degeneration, thoracic region: Secondary | ICD-10-CM | POA: Diagnosis not present

## 2016-10-06 DIAGNOSIS — M8580 Other specified disorders of bone density and structure, unspecified site: Secondary | ICD-10-CM

## 2016-10-06 MED ORDER — MELOXICAM 7.5 MG PO TABS: 7.5000 mg | ORAL_TABLET | Freq: Every day | ORAL | 0 refills | Status: DC

## 2016-10-06 NOTE — Telephone Encounter (Signed)
Notified pt and he is agreeable to proceed with bone density. Order signed.

## 2016-10-06 NOTE — Telephone Encounter (Signed)
Please let pt know that I reviewed his x rays.  As expected note is made of some arthritis/degnerative changes. Radiologist did note that his bones appeared to be mildly thin on the xray. I would like him to complete a bone density so we can evaluate him for osteoporosis/bone loss.

## 2016-10-06 NOTE — Patient Instructions (Addendum)
Please complete x rays on the first floor. Begin meloxicam once daily. Avoid golf for the next 2 weeks or movements that cause pain.  Do the exercises below twice daily.  Call if new/worsening symptoms or if not improved in 2-4 weeks.    Back Exercises The following exercises strengthen the muscles that help to support the back. They also help to keep the lower back flexible. Doing these exercises can help to prevent back pain or lessen existing pain. If you have back pain or discomfort, try doing these exercises 2-3 times each day or as told by your health care provider. When the pain goes away, do them once each day, but increase the number of times that you repeat the steps for each exercise (do more repetitions). If you do not have back pain or discomfort, do these exercises once each day or as told by your health care provider. Exercises Single Knee to Chest   Repeat these steps 3-5 times for each leg: 1. Lie on your back on a firm bed or the floor with your legs extended. 2. Bring one knee to your chest. Your other leg should stay extended and in contact with the floor. 3. Hold your knee in place by grabbing your knee or thigh. 4. Pull on your knee until you feel a gentle stretch in your lower back. 5. Hold the stretch for 10-30 seconds. 6. Slowly release and straighten your leg. Pelvic Tilt   Repeat these steps 5-10 times: 1. Lie on your back on a firm bed or the floor with your legs extended. 2. Bend your knees so they are pointing toward the ceiling and your feet are flat on the floor. 3. Tighten your lower abdominal muscles to press your lower back against the floor. This motion will tilt your pelvis so your tailbone points up toward the ceiling instead of pointing to your feet or the floor. 4. With gentle tension and even breathing, hold this position for 5-10 seconds. Cat-Cow   Repeat these steps until your lower back becomes more flexible: 1. Get into a hands-and-knees  position on a firm surface. Keep your hands under your shoulders, and keep your knees under your hips. You may place padding under your knees for comfort. 2. Let your head hang down, and point your tailbone toward the floor so your lower back becomes rounded like the back of a cat. 3. Hold this position for 5 seconds. 4. Slowly lift your head and point your tailbone up toward the ceiling so your back forms a sagging arch like the back of a cow. 5. Hold this position for 5 seconds. Press-Ups   Repeat these steps 5-10 times: 1. Lie on your abdomen (face-down) on the floor. 2. Place your palms near your head, about shoulder-width apart. 3. While you keep your back as relaxed as possible and keep your hips on the floor, slowly straighten your arms to raise the top half of your body and lift your shoulders. Do not use your back muscles to raise your upper torso. You may adjust the placement of your hands to make yourself more comfortable. 4. Hold this position for 5 seconds while you keep your back relaxed. 5. Slowly return to lying flat on the floor. Bridges   Repeat these steps 10 times: 1. Lie on your back on a firm surface. 2. Bend your knees so they are pointing toward the ceiling and your feet are flat on the floor. 3. Tighten your buttocks muscles and lift  your buttocks off of the floor until your waist is at almost the same height as your knees. You should feel the muscles working in your buttocks and the back of your thighs. If you do not feel these muscles, slide your feet 1-2 inches farther away from your buttocks. 4. Hold this position for 3-5 seconds. 5. Slowly lower your hips to the starting position, and allow your buttocks muscles to relax completely. If this exercise is too easy, try doing it with your arms crossed over your chest. Abdominal Crunches   Repeat these steps 5-10 times: 1. Lie on your back on a firm bed or the floor with your legs extended. 2. Bend your knees so  they are pointing toward the ceiling and your feet are flat on the floor. 3. Cross your arms over your chest. 4. Tip your chin slightly toward your chest without bending your neck. 5. Tighten your abdominal muscles and slowly raise your trunk (torso) high enough to lift your shoulder blades a tiny bit off of the floor. Avoid raising your torso higher than that, because it can put too much stress on your low back and it does not help to strengthen your abdominal muscles. 6. Slowly return to your starting position. Back Lifts  Repeat these steps 5-10 times: 1. Lie on your abdomen (face-down) with your arms at your sides, and rest your forehead on the floor. 2. Tighten the muscles in your legs and your buttocks. 3. Slowly lift your chest off of the floor while you keep your hips pressed to the floor. Keep the back of your head in line with the curve in your back. Your eyes should be looking at the floor. 4. Hold this position for 3-5 seconds. 5. Slowly return to your starting position. Contact a health care provider if:  Your back pain or discomfort gets much worse when you do an exercise.  Your back pain or discomfort does not lessen within 2 hours after you exercise. If you have any of these problems, stop doing these exercises right away. Do not do them again unless your health care provider says that you can. Get help right away if:  You develop sudden, severe back pain. If this happens, stop doing the exercises right away. Do not do them again unless your health care provider says that you can. This information is not intended to replace advice given to you by your health care provider. Make sure you discuss any questions you have with your health care provider. Document Released: 08/21/2004 Document Revised: 11/21/2015 Document Reviewed: 09/07/2014 Elsevier Interactive Patient Education  2017 [employer].

## 2016-10-06 NOTE — Progress Notes (Signed)
Pre visit review using our clinic review tool, if applicable. No additional management support is needed unless otherwise documented below in the visit note. 

## 2016-10-06 NOTE — Progress Notes (Signed)
Subjective:    Patient ID: Stephen Evans, male    DOB: 1946/02/22, 71 y.o.   MRN: [id number]  HPI  Stephen Evans is a 71 yr old male who presents today with chief complaint of low back pain.  Pain has been present x 1 month. Pain worsened after playing golf 10 days ago.  Pain is located in the lower thoracic spine. Sometimes has pain in the lumbar region. Typically on the right side of his spine, but when it is lower, radiates across the lower back. Has associated stiffness.  Pill in the lumbar area helped some when in his chair.  He plays 3-4 times a week. Bending hurt.  Pain does not radiate down his legs.    Review of Systems See HPI  Past Medical History:  Diagnosis Date  . Melanoma ([place]) x 2  . Testicular cancer [facility]) 2011   testicular--radiation     Social History   Social History  . Marital status: Married    Spouse name: N/A  . Number of children: 2  . Years of education: N/A   Occupational History  . retired    Social History Main Topics  . Smoking status: Former Smoker    Years: 30.00    Types: Cigarettes    Quit date: 09/06/1997  . Smokeless tobacco: Never Used  . Alcohol use 4.2 oz/week    7 Glasses of wine per week  . Drug use: No  . Sexual activity: Not on file   Other Topics Concern  . 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 LYMPH GLAND EXCISION Right 1979   axillary, benign  . MELANOMA EXCISION Right 03/2014   elbow  . ORCHIECTOMY Right 2011   followed by radiation  . TONSILLECTOMY  1959  . wide excision of melanoma on back  1979    Family History  Problem Relation Age of Onset  . Epilepsy Mother   . Esophageal cancer Father        smoker/drinker died at 38  . Breast cancer Sister      diagnosed at 41    No Known Allergies  Current Outpatient Prescriptions on File Prior to Visit  Medication Sig Dispense Refill  . Probiotic Product (PROBIOTIC PO) Take 1 tablet by mouth daily.     No current facility-administered medications on file prior to visit.     BP 136/75 (BP Location: Left Arm, Cuff Size: Large)   Pulse 62   Temp 98.2 F (36.8 C) (Oral)   Resp 16   Ht 5\' 7"  (1.702 m)   Wt 224 lb 12.8 oz (102 kg)   SpO2 100% Comment: room air  BMI 35.21 kg/m       Objective:   Physical Exam  Constitutional: He is oriented to person, place, and time. He appears well-developed and well-nourished. No distress.  HENT:  Head: Normocephalic and atraumatic.  Cardiovascular: Normal rate and regular rhythm.   No murmur heard. Pulmonary/Chest: Effort normal and breath sounds normal. No respiratory distress. He has no wheezes. He has no rales.  Musculoskeletal: He exhibits no edema.  Neurological:  He is alert and oriented to person, place, and time.  Reflex Scores:      Patellar reflexes are 2+ on the right side and 2+ on the left side. Bilateral LE strength is 5/5  Skin: Skin is warm and dry.  Psychiatric: He has a normal mood and affect. His behavior is normal. Thought content normal.          Assessment & Plan:  Back pain-New.  will obtain x ray of the lumbar and thoracic spine. Trial of meloxicam. Advised patient to avoid golf for the next 2 weeks. Begin exercises as detailed in AVS bid. Call if new/worsening symptoms or if not improved in 2-4 weeks. Pt verbalizes understanding.

## 2016-10-07 ENCOUNTER — Encounter: Payer: Self-pay | Admitting: Family

## 2016-10-08 DIAGNOSIS — R69 Illness, unspecified: Secondary | ICD-10-CM | POA: Diagnosis not present

## 2016-10-13 ENCOUNTER — Ambulatory Visit (HOSPITAL_BASED_OUTPATIENT_CLINIC_OR_DEPARTMENT_OTHER)
Admission: RE | Admit: 2016-10-13 | Discharge: 2016-10-13 | Disposition: A | Discharge status: Home / Self Care | Payer: Medicare HMO | Source: Ambulatory Visit | Attending: Family | Admitting: Family

## 2016-10-13 DIAGNOSIS — M898X9 Other specified disorders of bone, unspecified site: Secondary | ICD-10-CM | POA: Insufficient documentation

## 2016-10-13 DIAGNOSIS — Z13820 Encounter for screening for osteoporosis: Secondary | ICD-10-CM | POA: Diagnosis not present

## 2016-10-13 DIAGNOSIS — M8580 Other specified disorders of bone density and structure, unspecified site: Secondary | ICD-10-CM

## 2016-10-14 ENCOUNTER — Encounter: Payer: Self-pay | Admitting: Family

## 2016-10-20 ENCOUNTER — Ambulatory Visit (INDEPENDENT_AMBULATORY_CARE_PROVIDER_SITE_OTHER): Payer: Medicare HMO | Admitting: Family

## 2016-10-20 VITALS — BP 128/75 | HR 86 | Temp 98.4°F | Resp 16 | Ht 67.0 in | Wt 223.6 lb

## 2016-10-20 DIAGNOSIS — B349 Viral infection, unspecified: Secondary | ICD-10-CM | POA: Diagnosis not present

## 2016-10-20 DIAGNOSIS — J029 Acute pharyngitis, unspecified: Secondary | ICD-10-CM

## 2016-10-20 LAB — POCT RAPID STREP A (OFFICE): Rapid Strep A Screen: NEGATIVE

## 2016-10-20 NOTE — Patient Instructions (Signed)
Your symptoms are most consistent for a virus. Call if new/worsening symptoms or if not improved in 3 days.

## 2016-10-20 NOTE — Progress Notes (Signed)
Subjective:    Patient ID: Stephen Evans, male    DOB: 1945-10-18, 71 y.o.   MRN: [id number]  HPI  Stephen Evans is a 71 yr old male who presents today with c/o sore throat, ear pain, chills/sweats. Symptoms began 10/17/16 afternoon.   Friday night right ear pain was 9/10 on Friday night.  Continues to have ear pain, has moderate sore throat.  Has some frontal headache. Has taken advil which has helped with ear pain. Denies cough nasal congestion or sick contacts. Reports poor appetite.  Back pain is improved, exercises are helping.   Review of Systems See HPI  Past Medical History:  Diagnosis Date  . Melanoma ([place]) x 2  . Testicular cancer [facility]) 2011   testicular--radiation     Social History   Social History  . Marital status: Married    Spouse name: N/A  . Number of children: 2  . Years of education: N/A   Occupational History  . retired    Social History Main Topics  . Smoking status: Former Smoker    Years: 30.00    Types: Cigarettes    Quit date: 09/06/1997  . Smokeless tobacco: Never Used  . Alcohol use 4.2 oz/week    7 Glasses of wine per week  . Drug use: No  . Sexual activity: Not on file   Other Topics Concern  . 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 LYMPH GLAND EXCISION Right 1979   axillary, benign  . MELANOMA EXCISION Right 03/2014   elbow  . ORCHIECTOMY Right 2011   followed by radiation  . TONSILLECTOMY  1959  . wide excision of melanoma on back  1979    Family History  Problem Relation Age of Onset  . Epilepsy Mother   . Esophageal cancer Father      smoker/drinker died at 85  . Breast cancer Sister      diagnosed at  53    No Known Allergies  Current Outpatient Prescriptions on File Prior to Visit  Medication Sig Dispense Refill  . Probiotic Product (PROBIOTIC PO) Take 1 tablet by mouth daily.     No current facility-administered medications on file prior to visit.     BP 128/75 (BP Location: Left Arm, Patient Position: Sitting, Cuff Size: Large)   Pulse 86   Temp 98.4 F (36.9 C) (Oral)   Resp 16   Ht 5\' 7"  (1.702 m)   Wt 223 lb 9.6 oz (101.4 kg)   SpO2 100%   BMI 35.02 kg/m       Objective:   Physical Exam  Constitutional: He is oriented to person, place, and time. He appears well-developed and well-nourished. No distress.  HENT:  Head: Normocephalic and atraumatic.  Right Ear: Tympanic membrane and ear canal normal.  Left Ear: Tympanic membrane and ear canal normal.  Mouth/Throat: No oropharyngeal exudate, posterior oropharyngeal edema or posterior oropharyngeal erythema.  Cardiovascular: Normal rate and regular rhythm.   No murmur heard. Pulmonary/Chest: Effort normal and breath sounds normal. No respiratory distress. He has no wheezes. He has no rales.  Musculoskeletal: He  exhibits no edema.  Lymphadenopathy:    He has no cervical adenopathy.  Neurological: He is alert and oriented to person, place, and time.  Skin: Skin is warm and dry.  Psychiatric: He has a normal mood and affect. His behavior is normal. Thought content normal.          Assessment & Plan:  Viral infection- symptoms most consistent with virus.  Rapid strep test is negative. Pt is afebrile. No obvious otitis media noted.  Advised pt on supportive measures.  (rest hydration, ibuprofen prn).  Pt is advised to call if new/worsening symptoms or if symptoms are not improved in 3 days.

## 2016-10-20 NOTE — Addendum Note (Signed)
Addended by: Kelle Darting A on: 10/20/2016 02:04 PM   Modules accepted: Orders

## 2016-10-20 NOTE — Progress Notes (Signed)
Pre visit review using our clinic review tool, if applicable. No additional management support is needed unless otherwise documented below in the visit note. 

## 2016-11-19 DIAGNOSIS — L918 Other hypertrophic disorders of the skin: Secondary | ICD-10-CM | POA: Diagnosis not present

## 2016-11-19 DIAGNOSIS — Z85820 Personal history of malignant melanoma of skin: Secondary | ICD-10-CM | POA: Diagnosis not present

## 2016-11-19 DIAGNOSIS — L570 Actinic keratosis: Secondary | ICD-10-CM | POA: Diagnosis not present

## 2016-11-19 DIAGNOSIS — D224 Melanocytic nevi of scalp and neck: Secondary | ICD-10-CM | POA: Diagnosis not present

## 2016-11-19 DIAGNOSIS — D1801 Hemangioma of skin and subcutaneous tissue: Secondary | ICD-10-CM | POA: Diagnosis not present

## 2016-11-19 DIAGNOSIS — L821 Other seborrheic keratosis: Secondary | ICD-10-CM | POA: Diagnosis not present

## 2016-11-19 DIAGNOSIS — L814 Other melanin hyperpigmentation: Secondary | ICD-10-CM | POA: Diagnosis not present

## 2016-12-19 ENCOUNTER — Telehealth: Payer: Self-pay

## 2016-12-19 NOTE — Telephone Encounter (Signed)
Patient is on the list for [facility] 2018 and may be a good candidate for an AWV. Please let me know if/when appt is scheduled.   

## 2016-12-19 NOTE — Telephone Encounter (Signed)
Called patient to schedule awv. Lvm for patient to call office to schedule appt.  °

## 2016-12-24 ENCOUNTER — Ambulatory Visit (INDEPENDENT_AMBULATORY_CARE_PROVIDER_SITE_OTHER): Payer: Medicare HMO | Admitting: Family

## 2016-12-24 ENCOUNTER — Encounter: Payer: Self-pay | Admitting: Family

## 2016-12-24 ENCOUNTER — Ambulatory Visit (HOSPITAL_BASED_OUTPATIENT_CLINIC_OR_DEPARTMENT_OTHER)
Admission: RE | Admit: 2016-12-24 | Discharge: 2016-12-24 | Disposition: A | Discharge status: Home / Self Care | Payer: Medicare HMO | Source: Ambulatory Visit | Attending: Family | Admitting: Family

## 2016-12-24 VITALS — BP 124/82 | HR 81 | Temp 98.9°F | Resp 16 | Ht 67.0 in | Wt 221.8 lb

## 2016-12-24 DIAGNOSIS — N433 Hydrocele, unspecified: Secondary | ICD-10-CM | POA: Diagnosis not present

## 2016-12-24 DIAGNOSIS — N50819 Testicular pain, unspecified: Secondary | ICD-10-CM | POA: Diagnosis not present

## 2016-12-24 DIAGNOSIS — N50812 Left testicular pain: Secondary | ICD-10-CM | POA: Insufficient documentation

## 2016-12-24 DIAGNOSIS — I861 Scrotal varices: Secondary | ICD-10-CM | POA: Insufficient documentation

## 2016-12-24 LAB — CBC WITH DIFFERENTIAL/PLATELET
BASOS PCT: 2.6 % (ref 0.0–3.0)
Basophils Absolute: 0.2 10*3/uL — ABNORMAL HIGH (ref 0.0–0.1)
EOS ABS: 0.2 10*3/uL (ref 0.0–0.7)
Eosinophils Relative: 2.6 % (ref 0.0–5.0)
HEMATOCRIT: 45.9 % (ref 39.0–52.0)
Hemoglobin: 15.4 g/dL (ref 13.0–17.0)
LYMPHS PCT: 24.9 % (ref 12.0–46.0)
Lymphs Abs: 1.9 10*3/uL (ref 0.7–4.0)
MCHC: 33.7 g/dL (ref 30.0–36.0)
MCV: 89.7 fl (ref 78.0–100.0)
MONO ABS: 0.5 10*3/uL (ref 0.1–1.0)
Monocytes Relative: 6.1 % (ref 3.0–12.0)
NEUTROS ABS: 4.9 10*3/uL (ref 1.4–7.7)
Neutrophils Relative %: 63.8 % (ref 43.0–77.0)
PLATELETS: 300 10*3/uL (ref 150.0–400.0)
RBC: 5.11 Mil/uL (ref 4.22–5.81)
RDW: 13.8 % (ref 11.5–15.5)
WBC: 7.7 10*3/uL (ref 4.0–10.5)

## 2016-12-24 NOTE — Patient Instructions (Signed)
Please complete lab work prior to leaving and then proceed to the imaging department on the first floor. Call if increased pain/swelling/tenderness of if you develop fever.

## 2016-12-24 NOTE — Addendum Note (Signed)
Addended by: [name] on: 12/24/2016 01:23 PM   Modules accepted: Orders

## 2016-12-24 NOTE — Progress Notes (Signed)
Subjective:    Patient ID: Stephen Evans, male    DOB: January 24, 1946, 71 y.o.   MRN: [id number]  HPI  Stephen Evans is a 71 yr old male who presents today with chief complaint of testicular swelling/pain.  He has a history of testicular cancerwith right orchiectomy and radiation 2011. No fever, started mid day yesterday. First noticed while sitting in the chair in the evening the swelling went down. Was about the same this AM.  Describes discomfort due to increase in size.  Not currently seeing urology.    Review of Systems See HPI  Past Medical History:  Diagnosis Date  . Melanoma ([place]) x 2  . Testicular cancer [facility]) 2011   testicular--radiation     Social History   Social History  . Marital status: Married    Spouse name: N/A  . Number of children: 2  . Years of education: N/A   Occupational History  . retired    Social History Main Topics  . Smoking status: Former Smoker    Years: 30.00    Types: Cigarettes    Quit date: 09/06/1997  . Smokeless tobacco: Never Used  . Alcohol use 4.2 oz/week    7 Glasses of wine per week  . Drug use: No  . Sexual activity: Not on file   Other Topics Concern  . 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 LYMPH GLAND EXCISION Right 1979   axillary, benign  . MELANOMA EXCISION Right 03/2014   elbow  . ORCHIECTOMY Right 2011   followed by radiation  . TONSILLECTOMY  1959  . wide excision of melanoma on back  1979    Family History  Problem Relation Age of Onset  . Epilepsy Mother   . Esophageal cancer Father         smoker/drinker died at 22  . Breast cancer Sister         diagnosed at 59    No Known  Allergies  Current Outpatient Prescriptions on File Prior to Visit  Medication Sig Dispense Refill  . Probiotic Product (PROBIOTIC PO) Take 1 tablet by mouth daily.     No current facility-administered medications on file prior to visit.     BP 124/82 (BP Location: Left Arm, Cuff Size: Normal)   Pulse 81   Temp 98.9 F (37.2 C) (Oral)   Resp 16   Ht 5\' 7"  (1.702 m)   Wt 221 lb 12.8 oz (100.6 kg)   SpO2 100%   BMI 34.74 kg/m       Objective:   Physical Exam  Constitutional: He is oriented to person, place, and time. He appears well-developed and well-nourished. No distress.  HENT:  Head: Normocephalic and atraumatic.  Genitourinary: Penis normal.  Genitourinary Comments: Right testicle is surgically absent Left testicle is mildly enlarged, not significantly tender.  No erythema. No swelling or erythema noted on left leg.   Musculoskeletal: He exhibits no edema.  Neurological: He is alert and oriented to person, place, and time.  Skin: Skin is warm and dry.  Psychiatric: He has a normal mood and affect.  His behavior is normal. Thought content normal.          Assessment & Plan:  Testicular pain- will obtain CBC to assess wbc.  Send for scrotal US. Further recommendations pending review of these results.

## 2016-12-24 NOTE — Addendum Note (Signed)
Addended by: [name] on: 12/24/2016 01:28 PM   Modules accepted: Orders

## 2016-12-25 ENCOUNTER — Telehealth: Payer: Self-pay | Admitting: Family

## 2016-12-25 DIAGNOSIS — I861 Scrotal varices: Secondary | ICD-10-CM

## 2016-12-25 DIAGNOSIS — N433 Hydrocele, unspecified: Secondary | ICD-10-CM

## 2016-12-25 NOTE — Telephone Encounter (Signed)
Reviewed [place] results and cbc with patient yesterday evening.  Will plan referral to urology. Pt is agreeable. He is instructed to call if increased pain swelling, redness.

## 2016-12-27 ENCOUNTER — Encounter: Payer: Self-pay | Admitting: Family

## 2017-02-09 DIAGNOSIS — R69 Illness, unspecified: Secondary | ICD-10-CM | POA: Diagnosis not present

## 2017-02-23 DIAGNOSIS — N430 Encysted hydrocele: Secondary | ICD-10-CM | POA: Diagnosis not present

## 2017-05-26 DIAGNOSIS — Z23 Encounter for immunization: Secondary | ICD-10-CM | POA: Diagnosis not present

## 2017-05-27 DIAGNOSIS — L814 Other melanin hyperpigmentation: Secondary | ICD-10-CM | POA: Diagnosis not present

## 2017-05-27 DIAGNOSIS — D2272 Melanocytic nevi of left lower limb, including hip: Secondary | ICD-10-CM | POA: Diagnosis not present

## 2017-05-27 DIAGNOSIS — L821 Other seborrheic keratosis: Secondary | ICD-10-CM | POA: Diagnosis not present

## 2017-05-27 DIAGNOSIS — Z85820 Personal history of malignant melanoma of skin: Secondary | ICD-10-CM | POA: Diagnosis not present

## 2017-05-27 DIAGNOSIS — D1801 Hemangioma of skin and subcutaneous tissue: Secondary | ICD-10-CM | POA: Diagnosis not present

## 2017-05-27 DIAGNOSIS — L918 Other hypertrophic disorders of the skin: Secondary | ICD-10-CM | POA: Diagnosis not present

## 2017-05-27 DIAGNOSIS — L812 Freckles: Secondary | ICD-10-CM | POA: Diagnosis not present

## 2017-05-27 DIAGNOSIS — D224 Melanocytic nevi of scalp and neck: Secondary | ICD-10-CM | POA: Diagnosis not present

## 2017-07-01 ENCOUNTER — Ambulatory Visit (INDEPENDENT_AMBULATORY_CARE_PROVIDER_SITE_OTHER): Payer: Medicare HMO | Admitting: Family

## 2017-07-01 VITALS — BP 132/76 | HR 69 | Temp 97.9°F | Resp 16 | Ht 67.0 in | Wt 222.0 lb

## 2017-07-01 DIAGNOSIS — R5383 Other fatigue: Secondary | ICD-10-CM | POA: Diagnosis not present

## 2017-07-01 DIAGNOSIS — B349 Viral infection, unspecified: Secondary | ICD-10-CM | POA: Diagnosis not present

## 2017-07-01 LAB — CBC WITH DIFFERENTIAL/PLATELET
BASOS PCT: 0.9 % (ref 0.0–3.0)
Basophils Absolute: 0 10*3/uL (ref 0.0–0.1)
EOS PCT: 5.9 % — AB (ref 0.0–5.0)
Eosinophils Absolute: 0.3 10*3/uL (ref 0.0–0.7)
HCT: 46.8 % (ref 39.0–52.0)
HEMOGLOBIN: 15.9 g/dL (ref 13.0–17.0)
LYMPHS ABS: 1.2 10*3/uL (ref 0.7–4.0)
Lymphocytes Relative: 23.2 % (ref 12.0–46.0)
MCHC: 33.9 g/dL (ref 30.0–36.0)
MCV: 90.6 fl (ref 78.0–100.0)
MONO ABS: 0.5 10*3/uL (ref 0.1–1.0)
MONOS PCT: 10.2 % (ref 3.0–12.0)
Neutro Abs: 3.1 10*3/uL (ref 1.4–7.7)
Neutrophils Relative %: 59.8 % (ref 43.0–77.0)
Platelets: 203 10*3/uL (ref 150.0–400.0)
RBC: 5.17 Mil/uL (ref 4.22–5.81)
RDW: 13.3 % (ref 11.5–15.5)
WBC: 5.2 10*3/uL (ref 4.0–10.5)

## 2017-07-01 LAB — COMPREHENSIVE METABOLIC PANEL
ALBUMIN: 4.5 g/dL (ref 3.5–5.2)
ALK PHOS: 70 U/L (ref 39–117)
ALT: 22 U/L (ref 0–53)
AST: 18 U/L (ref 0–37)
BILIRUBIN TOTAL: 0.6 mg/dL (ref 0.2–1.2)
BUN: 15 mg/dL (ref 6–23)
CO2: 25 mEq/L (ref 19–32)
CREATININE: 0.88 mg/dL (ref 0.40–1.50)
Calcium: 9.3 mg/dL (ref 8.4–10.5)
Chloride: 104 mEq/L (ref 96–112)
GFR: 91.22 mL/min (ref >60.00)
GLUCOSE: 98 mg/dL (ref 70–99)
POTASSIUM: 3.8 meq/L (ref 3.5–5.1)
SODIUM: 138 meq/L (ref 135–145)
TOTAL PROTEIN: 7.2 g/dL (ref 6.0–8.3)

## 2017-07-01 LAB — TSH: TSH: 1.79 u[IU]/mL (ref 0.35–4.50)

## 2017-07-01 MED ORDER — SILDENAFIL CITRATE 20 MG PO TABS: ORAL_TABLET | ORAL | 1 refills | Status: DC

## 2017-07-01 NOTE — Progress Notes (Signed)
Subjective:    Patient ID: Stephen Evans, male    DOB: Apr 17, 1946, 71 y.o.   MRN: [id number]  HPI  Stephen Evans is a 71 yr old male who presents today with several symptoms. Reports body aches, decreased appetite, abdominal gas and fatigue.  Has been present since October.   Reports that he has had moderate headache, generalized aching, began 3-4 days ago.  Fatigue ("over the top") came on rather suddenly in October and has not improved. Napping every day.  Reports that also in October he had an episode of dizziness and fell.  Reports that "it was like my equilibrium was off."  Within 5 minutes he was fine. Has not had no other symptoms like this.  Appetite is down this week.    Wt Readings from Last 3 Encounters:  07/01/17 222 lb (100.7 kg)  12/24/16 221 lb 12.8 oz (100.6 kg)  10/20/16 223 lb 9.6 oz (101.4 kg)   ED- requests rx for viagra.    Review of Systems See HPI  Past Medical History:  Diagnosis Date  . Melanoma ([place]) x 2  . Testicular cancer [facility]) 2011   testicular--radiation     Social History   Socioeconomic History  . Marital status: Married    Spouse name: Not on file  . Number of children: 2  . Years of education: Not on file  . Highest education level: Not on file  Social Needs  . Financial resource strain: Not on file  . Food insecurity - worry: Not on file  . Food insecurity - inability: Not on file  . Transportation needs - medical: Not on file  . Transportation needs - non-medical: Not on file  Occupational History  . Occupation: retired  Tobacco Use  . Smoking status: Former Smoker    Years: 30.00    Types: Cigarettes    Last attempt to quit: 09/06/1997    Years since quitting: 19.8  . Smokeless tobacco: Never Used  Substance and Sexual Activity  . Alcohol use: Yes    Alcohol/week: 4.2 oz    Types: 7 Glasses of wine per week  . Drug use: No  . Sexual activity: Not on file  Other Topics Concern  . 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 LYMPH GLAND EXCISION Right 1979   axillary, benign  . MELANOMA EXCISION Right 03/2014   elbow  . ORCHIECTOMY Right 2001   followed by radiation  . TONSILLECTOMY  1959  . wide excision of melanoma on back  1979    Family History  Problem Relation Age of Onset  . Epilepsy Mother   . Esophageal cancer Father         smoker/drinker died at 56  . Breast cancer Sister         diagnosed at 51    No Known Allergies  Current Outpatient Medications on File Prior to Visit  Medication Sig Dispense Refill  . Probiotic Product (PROBIOTIC PO) Take 1 tablet by mouth daily.     No current facility-administered medications on file prior to visit.     BP 132/76 (BP  Location: Left Arm, Patient Position: Sitting, Cuff Size: Large)   Pulse 69   Temp 97.9 F (36.6 C) (Oral)   Resp 16   Ht 5\' 7"  (1.702 m)   Wt 222 lb (100.7 kg)   SpO2 99%   BMI 34.77 kg/m       Objective:   Physical Exam  Constitutional: He is oriented to person, place, and time. He appears well-developed and well-nourished. No distress.  HENT:  Head: Normocephalic and atraumatic.  Right Ear: Tympanic membrane and ear canal normal.  Left Ear: Tympanic membrane and ear canal normal.  Mouth/Throat: No oropharyngeal exudate, posterior oropharyngeal edema or posterior oropharyngeal erythema.  Cardiovascular: Normal rate and regular rhythm.  No murmur heard. Pulmonary/Chest: Effort normal and breath sounds normal. No respiratory distress. He has no wheezes. He has no rales.  Musculoskeletal: He exhibits no edema.  Neurological: He is alert and oriented to person, place, and time.  Skin: Skin is warm and  dry.  Psychiatric: He has a normal mood and affect. His behavior is normal. Thought content normal.          Assessment & Plan:  Acute viral illness- 3-4 days of malaise, anorexia likely viral. Advised pt to let me know if new/worsening symptoms or if not improve in 3-4 days.  Fatigue- longer standing issue. Check testosterone, cbc, TSH, cmet.   ED-requests rx for sildenafil.  rx printed.  rx cancelled at [facility].

## 2017-07-01 NOTE — Patient Instructions (Signed)
Please complete lab work prior to leaving. Call if new/worsening symptoms or if not improved in 3-4 days.

## 2017-07-03 LAB — TESTOSTERONE TOTAL,FREE,BIO, MALES
Albumin: 4.3 g/dL (ref 3.6–5.1)
SEX HORMONE BINDING: 39 nmol/L (ref 22–77)
Testosterone: 166 ng/dL — ABNORMAL LOW (ref 250–827)

## 2017-07-04 ENCOUNTER — Encounter: Payer: Self-pay | Admitting: Family

## 2017-07-04 DIAGNOSIS — E291 Testicular hypofunction: Secondary | ICD-10-CM

## 2017-07-09 DIAGNOSIS — R69 Illness, unspecified: Secondary | ICD-10-CM | POA: Diagnosis not present

## 2017-08-19 DIAGNOSIS — E291 Testicular hypofunction: Secondary | ICD-10-CM | POA: Diagnosis not present

## 2017-09-23 DIAGNOSIS — R948 Abnormal results of function studies of other organs and systems: Secondary | ICD-10-CM | POA: Diagnosis not present

## 2017-09-23 DIAGNOSIS — E291 Testicular hypofunction: Secondary | ICD-10-CM | POA: Diagnosis not present

## 2017-10-21 DIAGNOSIS — R69 Illness, unspecified: Secondary | ICD-10-CM | POA: Diagnosis not present

## 2017-11-30 ENCOUNTER — Encounter: Payer: Self-pay | Admitting: Neurology

## 2017-11-30 ENCOUNTER — Encounter: Payer: Self-pay | Admitting: Family

## 2017-11-30 ENCOUNTER — Ambulatory Visit (INDEPENDENT_AMBULATORY_CARE_PROVIDER_SITE_OTHER): Payer: Medicare HMO | Admitting: Family

## 2017-11-30 VITALS — BP 128/68 | HR 69 | Temp 98.3°F | Resp 18 | Ht 67.0 in | Wt 221.8 lb

## 2017-11-30 DIAGNOSIS — R251 Tremor, unspecified: Secondary | ICD-10-CM | POA: Diagnosis not present

## 2017-11-30 DIAGNOSIS — M25561 Pain in right knee: Secondary | ICD-10-CM | POA: Diagnosis not present

## 2017-11-30 DIAGNOSIS — L989 Disorder of the skin and subcutaneous tissue, unspecified: Secondary | ICD-10-CM | POA: Diagnosis not present

## 2017-11-30 DIAGNOSIS — G8929 Other chronic pain: Secondary | ICD-10-CM

## 2017-11-30 NOTE — Progress Notes (Signed)
Subjective:    Patient ID: Stephen Evans, male    DOB: 11/23/1945, 72 y.o.   MRN: [id number]  HPI  Stephen Evans is a 72 yr old male who presents today with several concerns:  1) Skin problem- noted tender firm boil left hip, had surrounding erythema, never came to head, now nearly resolved.  2) R knee pain- has had for years, requests referral to orthopedics. Reports remote arthroscopic surgery 35 years ago. Reports that he has always had problems since that time.  Reports that when he was walking "It felt like my knee cap slipped."    3) Tremor- reports intermittent tremor in the right [place]. Occurs several times a week. Reports that about 4-5 years ago he would develop intermittent tremor every 4-5 months. Notes that in the last month he has trouble writing, pouring out of a bottle.  Denies gait issues   Review of Systems See HPI  Past Medical History:  Diagnosis Date  . Melanoma ([place]) x 2  . Testicular cancer [facility]) 2011   testicular--radiation     Social History   Socioeconomic History  . Marital status: Married    Spouse name: Not on file  . Number of children: 2  . Years of education: Not on file  . Highest education level: Not on file  Occupational History  . Occupation: retired  Scientific laboratory technician  . Financial resource strain: Not on file  . Food insecurity:    Worry: Not on file    Inability: Not on file  . Transportation needs:    Medical: Not on file    Non-medical: Not on file  Tobacco Use  . Smoking status: Former Smoker    Years: 30.00    Types: Cigarettes    Last attempt to quit: 09/06/1997    Years since quitting: 20.2  . Smokeless tobacco: Never Used  Substance and Sexual Activity  . Alcohol use: Yes    Alcohol/week: 4.2 oz    Types: 7 Glasses of wine per week  . Drug use: No  . Sexual activity: Not on file  Lifestyle  . Physical activity:    Days per week: Not on file    Minutes per session: Not on file  . Stress: Not on file  Relationships  .  Social connections:    Talks on phone: Not on file    Gets together: Not on file    Attends religious service: Not on file    Active member of club or organization: Not on file    Attends meetings of clubs or organizations: Not on file    Relationship status: Not on file  . Intimate partner violence:    Fear of current or ex partner: Not on file    Emotionally abused: Not on file    Physically abused: Not on file    Forced sexual activity: Not on file  Other Topics Concern  . 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 LYMPH GLAND EXCISION Right 1979   axillary, benign  . MELANOMA EXCISION Right 03/2014   elbow  . ORCHIECTOMY Right 2001  followed by radiation  . TONSILLECTOMY  1959  . wide excision of melanoma on back  1979    Family History  Problem Relation Age of Onset  . Epilepsy Mother   . Esophageal cancer Father         smoker/drinker died at 31  . Breast cancer Sister         diagnosed at 56    No Known Allergies  Current Outpatient Medications on File Prior to Visit  Medication Sig Dispense Refill  . Probiotic Product (PROBIOTIC PO) Take 1 tablet by mouth daily.    . sildenafil (REVATIO) 20 MG tablet 1-2 tabs by mouth 30 minutes prior to sexual activity 50 tablet 1  . Testosterone 540 MG PLLT 1 application to arm daily.     No current facility-administered medications on file prior to visit.     BP 128/68 (BP Location: Left Arm, Cuff Size: Large)   Pulse 69   Temp 98.3 F (36.8 C) (Oral)   Resp 18   Ht 5\' 7"  (1.702 m)   Wt 221 lb 12.8 oz (100.6 kg)   SpO2 99%   BMI 34.74 kg/m       Objective:   Physical Exam  Constitutional: He  is oriented to person, place, and time. He appears well-developed and well-nourished. No distress.  HENT:  Head: Normocephalic and atraumatic.  Cardiovascular: Normal rate and regular rhythm.  No murmur heard. Pulmonary/Chest: Effort normal and breath sounds normal. No respiratory distress. He has no wheezes. He has no rales.  Musculoskeletal: He exhibits no edema.  Neurological: He is alert and oriented to person, place, and time. He exhibits normal muscle tone.  Reflex Scores:      Patellar reflexes are 2+ on the right side and 2+ on the left side. RUE tremor when he attempts "empty can" movement  Skin: Skin is warm and dry.  Psychiatric: He has a normal mood and affect. His behavior is normal. Thought content normal.  Small "pimple" left hip          Assessment & Plan:  RUE tremor- requests referral to neurology. He is concerned about possibility of "early parkinsons."  Will arrange referral.  R knee pain- refer to ortho for further evaluation.  Skin problem- appears to have a small resolved skin infection, only small "pimple" remains. Advised pt to let me know if it becomes more red/swollen.

## 2017-11-30 NOTE — Patient Instructions (Addendum)
You will be contacted about your referrals to neurology and orthopedics.

## 2017-12-03 NOTE — Progress Notes (Signed)
Stephen Evans was seen today in the movement disorders clinic for neurologic consultation at the request of [name], NP.  The consultation is for the evaluation of tremor.  The records that were made available to me were reviewed.  Tremor: Yes.     How long has it been going on? 1-2 years ago but rare then (only tremor one time every 3-4 months).  Now only notes it every 1-2 weeks  At rest or with activation?  activation  When is it noted the most?  Pouring liquid with R arm supinated  Fam hx of tremor?  No.  Located where?  Right upper extremity.  Wife doesn't notice tremor at all  Affected by caffeine:  No. (drinks 1-4 cups coffee per day)  Affected by alcohol:  No. (drinks 1 glass wine per evening meal; 1 "generous" scotch 5 days per week)  Affected by stress:  No.  Affected by fatigue:  "its so infrequent that I don't associate it with things."  Spills glass of liquid if full:  Depends on the day as tremor is not daily  Affects ADL's (tying shoes, brushing teeth, etc):  No.  Tremor inducing meds:  No.  Other Specific Symptoms:  Voice: no change Sleep: sleeps fair  Vivid Dreams:  No.  Acting out dreams:  No. Wet Pillows: Yes.  , occasionally Postural symptoms:  No.  Falls?  Had one last fall - and slowly went to the floor.  Pt states that was "vertigo" Bradykinesia symptoms: no bradykinesia noted Loss of smell:  No. Loss of taste:  No. Urinary Incontinence:  No. Difficulty Swallowing:  No. Handwriting, micrographia: No. but some sloppiness and some trouble with writing numbers Trouble with ADL's:  No.  Trouble buttoning clothing: No. Depression:  No. Memory changes:  No. Hallucinations:  No.  visual distortions: No. N/V:  No. Lightheaded:  No.  Syncope: No. Diplopia:  No. Dyskinesia:  No.  Neuroimaging of the brain has not previously been performed.    PREVIOUS MEDICATIONS: none to date  ALLERGIES:  No Known Allergies  CURRENT MEDICATIONS:    Outpatient Encounter Medications as of 12/08/2017  Medication Sig  . Probiotic Product (PROBIOTIC PO) Take 1 tablet by mouth daily.  . sildenafil (REVATIO) 20 MG tablet 1-2 tabs by mouth 30 minutes prior to sexual activity  . Testosterone 563 MG PLLT 1 application to arm daily.   No facility-administered encounter medications on file as of 12/08/2017.     PAST MEDICAL HISTORY:   Past Medical History:  Diagnosis Date  . Melanoma ([place]) x 2  . Testicular cancer [facility]) 2011   testicular--radiation    PAST SURGICAL HISTORY:   Past Surgical History:  Procedure Laterality Date  . LYMPH GLAND EXCISION Right 1979   axillary, benign  . MELANOMA EXCISION Right 03/2014   elbow  . ORCHIECTOMY Right 2001   followed by radiation  . TONSILLECTOMY  1959  . wide excision of melanoma on back  1979    SOCIAL HISTORY:   Social History   Socioeconomic History  . Marital status: Married    Spouse name: Not on file  . Number of children: 2  . Years of education: Not on file  . Highest education level: Not on file  Occupational History  . Occupation: retired  Scientific laboratory technician  . Financial resource strain: Not on file  . Food insecurity:    Worry: Not on file    Inability: Not on file  . Transportation needs:  Medical: Not on file    Non-medical: Not on file  Tobacco Use  . Smoking status: Former Smoker    Years: 30.00    Types: Cigarettes    Last attempt to quit: 09/06/1997    Years since quitting: 20.2  . Smokeless tobacco: Never Used  Substance and Sexual Activity  . Alcohol use: Yes    Alcohol/week: 4.2 oz    Types: 7 Glasses of wine per week  . Drug use: No  . Sexual activity: Not on file  Lifestyle  . Physical activity:    Days per week: Not on file    Minutes per session: Not on file  . Stress: Not on file  Relationships  . Social connections:    Talks on phone: Not on file    Gets together: Not on file    Attends religious service: Not on file    Active member of club  or organization: Not on file    Attends meetings of clubs or organizations: Not on file    Relationship status: Not on file  . Intimate partner violence:    Fear of current or ex partner: Not on file    Emotionally abused: Not on file    Physically abused: Not on file    Forced sexual activity: Not on file  Other Topics Concern  . 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 Mother  Alive  . Father  Deceased  . Sister  Alive    ROS:  A complete 10 system review of systems was obtained and was unremarkable apart from what is mentioned above.  PHYSICAL EXAMINATION:    VITALS:   Vitals:   12/08/17 0811  BP: 136/86  Pulse: 68  SpO2: 97%  Weight: 224 lb (101.6 kg)  Height: 5\' 7"  (1.702 m)    GEN:  The patient appears stated age and is in NAD. HEENT:  Normocephalic, atraumatic.  The mucous membranes are moist. The superficial temporal arteries are without ropiness or tenderness. CV:  RRR Lungs:  CTAB Neck/HEME:  There are no carotid bruits bilaterally.  Neurological examination:  Orientation: The patient is alert and oriented x3. Fund of knowledge is appropriate.  Recent and remote memory are intact.  Attention and concentration are normal.    Able to name objects and repeat phrases. Cranial nerves: There is good facial symmetry except pseudoptosis from lid lag on the left.   Pupils are equal round and reactive to light bilaterally. Fundoscopic exam reveals clear margins bilaterally. Extraocular muscles are intact. The visual fields are full to confrontational testing. The speech is fluent and clear. Soft palate rises symmetrically and there is no tongue  deviation. Hearing is intact to conversational tone. Sensation: Sensation is intact to light and pinprick throughout (facial, trunk, extremities). Vibration is intact at the bilateral big toe. There is no extinction with double simultaneous stimulation. There is no sensory dermatomal level identified. Motor: Strength is 5/5 in the bilateral upper and lower extremities.   Shoulder shrug is equal and symmetric.  There is no pronator drift. Deep tendon reflexes: Deep tendon reflexes are  2/4 at the bilateral biceps, triceps, brachioradialis, patella and achilles. Plantar responses are downgoing bilaterally.  Movement examination: Tone: There is normal tone in the bilateral upper extremities.  The tone in the lower extremities is normal.  Abnormal movements: There is no rest or intention tremor.  There is no tremor when given a weight.  Minimal tremor evident with archimedes spirals on the right.  No siginificant trouble pouring water from one glass to another Coordination:  There is no decremation with RAM's, with any form of RAMS, including alternating supination and pronation of the forearm, Moya opening and closing, finger taps, heel taps and toe taps. Gait and Station: The patient has no difficulty arising out of a deep-seated chair without the use of the hands. The patient's stride length is normal.  The patient has a negative pull test.      Labs:  Lab Results  Component Value Date   TSH 1.79 07/01/2017     Chemistry      Component Value Date/Time   NA 138 07/01/2017 1044   K 3.8 07/01/2017 1044   CL 104 07/01/2017 1044   CO2 25 07/01/2017 1044   BUN 15 07/01/2017 1044   CREATININE 0.88 07/01/2017 1044   CREATININE 0.93 09/15/2012 0831      Component Value Date/Time   CALCIUM 9.3 07/01/2017 1044   ALKPHOS 70 07/01/2017 1044   AST 18 07/01/2017 1044   ALT 22 07/01/2017 1044   BILITOT 0.6 07/01/2017 1044       ASSESSMENT/PLAN:  1.  Tremor  -didn't see much tremor on  examination today.  I did reassure him that I saw no evidence of PD, which is ultimately why he was here.  I did tell him that didn't mean it couldn't arise in the future but he certainly doesn't have it now.  Would recommend repeating examination in a year to make sure that nothing changes.  He was agreeable.  Will call me if something new arises before that year.  Cc:  [name], NP

## 2017-12-08 ENCOUNTER — Ambulatory Visit: Payer: Medicare HMO | Admitting: Neurology

## 2017-12-08 ENCOUNTER — Encounter: Payer: Self-pay | Admitting: Neurology

## 2017-12-08 VITALS — BP 136/86 | HR 68 | Ht 67.0 in | Wt 224.0 lb

## 2017-12-08 DIAGNOSIS — R251 Tremor, unspecified: Secondary | ICD-10-CM

## 2017-12-09 DIAGNOSIS — M25561 Pain in right knee: Secondary | ICD-10-CM | POA: Diagnosis not present

## 2018-02-09 DIAGNOSIS — R948 Abnormal results of function studies of other organs and systems: Secondary | ICD-10-CM | POA: Diagnosis not present

## 2018-02-09 DIAGNOSIS — E291 Testicular hypofunction: Secondary | ICD-10-CM | POA: Diagnosis not present

## 2018-02-15 DIAGNOSIS — N5201 Erectile dysfunction due to arterial insufficiency: Secondary | ICD-10-CM | POA: Diagnosis not present

## 2018-02-15 DIAGNOSIS — E291 Testicular hypofunction: Secondary | ICD-10-CM | POA: Diagnosis not present

## 2018-02-16 DIAGNOSIS — R69 Illness, unspecified: Secondary | ICD-10-CM | POA: Diagnosis not present

## 2018-05-18 ENCOUNTER — Encounter: Payer: Self-pay | Admitting: Family

## 2018-05-21 ENCOUNTER — Encounter: Payer: Self-pay | Admitting: Family

## 2018-05-21 ENCOUNTER — Ambulatory Visit (INDEPENDENT_AMBULATORY_CARE_PROVIDER_SITE_OTHER): Payer: Medicare HMO | Admitting: Family

## 2018-05-21 ENCOUNTER — Ambulatory Visit (HOSPITAL_BASED_OUTPATIENT_CLINIC_OR_DEPARTMENT_OTHER)
Admission: RE | Admit: 2018-05-21 | Discharge: 2018-05-21 | Disposition: A | Discharge status: Home / Self Care | Payer: Medicare HMO | Source: Ambulatory Visit | Attending: Family | Admitting: Family

## 2018-05-21 VITALS — BP 122/78 | HR 68 | Temp 98.6°F | Resp 16 | Ht 67.0 in | Wt 221.0 lb

## 2018-05-21 DIAGNOSIS — R0683 Snoring: Secondary | ICD-10-CM | POA: Diagnosis not present

## 2018-05-21 DIAGNOSIS — S322XXA Fracture of coccyx, initial encounter for closed fracture: Secondary | ICD-10-CM | POA: Insufficient documentation

## 2018-05-21 DIAGNOSIS — M533 Sacrococcygeal disorders, not elsewhere classified: Secondary | ICD-10-CM

## 2018-05-21 DIAGNOSIS — X58XXXA Exposure to other specified factors, initial encounter: Secondary | ICD-10-CM | POA: Diagnosis not present

## 2018-05-21 DIAGNOSIS — R1013 Epigastric pain: Secondary | ICD-10-CM | POA: Diagnosis not present

## 2018-05-21 MED ORDER — OMEPRAZOLE 40 MG PO CPDR: 40.0000 mg | DELAYED_RELEASE_CAPSULE | Freq: Every day | ORAL | 3 refills | Status: DC

## 2018-05-21 NOTE — Progress Notes (Signed)
Subjective:    Patient ID: Stephen Evans, male    DOB: [birth year], 72 y.o.   MRN: [id number]  HPI  Patient is a 72 yr old male who presents today with chief complaint of "tailbone pain." slipped on hardwood stairs in his socks.  Only bothers him is sits, especially in a hard chair or when h tries to get up from out of a chair. Played golf and walked and rode the cart some.  Tried a donut which helped some.  Some improvement.   Reports that he has intermittent stomach "pressure." Sometimes causes nausea, sometimes diarrhea.  Tries to belch to relieve gas.  Has epigastric pain which will pas. Happended 1.5 years ago, then 6 months after. Has happened 2-3 times over the last few months last episode was on Sunday.  No sob or chest pain.  Reports that he sometimes breaks out in sweats.  He did eat a lot of sugar that day.   Snoring- comes and goes.  Wife is concerned about this.   Review of Systems See HPI  Past Medical History:  Diagnosis Date  . Melanoma ([place]) x 2  . Testicular cancer [facility]) 2011   testicular--radiation     Social History   Socioeconomic History  . Marital status: Married    Spouse name: Not on file  . Number of children: 2  . Years of education: Not on file  . Highest education level: Not on file  Occupational History  . Occupation: retired    Comment: Arts development officer  Social Needs  . Financial resource strain: Not on file  . Food insecurity:    Worry: Not on file    Inability: Not on file  . Transportation needs:    Medical: Not on file    Non-medical: Not on file  Tobacco Use  . Smoking status: Former Smoker    Years: 30.00    Types: Cigarettes    Last attempt to quit: 09/06/1997    Years since quitting: 20.7  . Smokeless tobacco: Never Used  Substance and Sexual Activity  . Alcohol use: Yes    Alcohol/week: 7.0 standard drinks    Types: 7 Glasses of wine per week    Comment: 1 glass wine/day and shot 5 days per week  . Drug use: No  .  Sexual activity: Not on file  Lifestyle  . Physical activity:    Days per week: Not on file    Minutes per session: Not on file  . Stress: Not on file  Relationships  . Social connections:    Talks on phone: Not on file    Gets together: Not on file    Attends religious service: Not on file    Active member of club or organization: Not on file    Attends meetings of clubs or organizations: Not on file    Relationship status: Not on file  . Intimate partner violence:    Fear of current or ex partner: Not on file    Emotionally abused: Not on file    Physically abused: Not on file    Forced sexual activity: Not on file  Other Topics Concern  . 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 LYMPH GLAND EXCISION Right 1979   axillary, benign  . MELANOMA EXCISION Right 03/2014   elbow  . ORCHIECTOMY Right 2001   followed by radiation  . TONSILLECTOMY  1959  . wide excision of melanoma on back  1979    Family History  Problem Relation Age of Onset  . Epilepsy Mother   . Esophageal cancer Father         smoker/drinker died at 58  . Breast cancer Sister         diagnosed at 47  . Alcohol abuse Brother     No Known Allergies  Current Outpatient Medications on File Prior to Visit  Medication Sig Dispense Refill  . Probiotic Product (PROBIOTIC PO) Take 1 tablet by mouth daily.    . sildenafil (REVATIO) 20 MG tablet 1-2 tabs by mouth 30 minutes prior to sexual activity 50 tablet 1  . Testosterone 950 MG PLLT 1 application to arm daily.     No current facility-administered medications on file prior to visit.     BP 122/78 (BP Location: Left Arm, Patient  Position: Standing, Cuff Size: Small)   Pulse 68   Temp 98.6 F (37 C) (Oral)   Resp 16   Ht 5\' 7"  (1.702 m)   Wt 221 lb (100.2 kg)   SpO2 98%   BMI 34.61 kg/m       Objective:   Physical Exam  Constitutional: He is oriented to person, place, and time. He appears well-developed and well-nourished. No distress.  HENT:  Head: Normocephalic and atraumatic.  Cardiovascular: Normal rate and regular rhythm.  No murmur heard. Pulmonary/Chest: Effort normal and breath sounds normal. No respiratory distress. He has no wheezes. He has no rales.  Abdominal: Soft. Bowel sounds are normal. He exhibits no distension and no mass. There is no tenderness.  Musculoskeletal: He exhibits no edema.  Neurological: He is alert and oriented to person, place, and time.  Skin: Skin is warm and dry.  Psychiatric: He has a normal mood and affect. His behavior is normal. Thought content normal.          Assessment & Plan:  Sacral pain- check xray to further evaluate.  Snoring- recommended sleep study. He declines at this time but will consider. Discussed risks of untreated OSA.  Epigastric pain- add PPI, obtain US to evaluate gallbladder. Plan referral to GI if symptoms worsen or fail to improve.

## 2018-05-21 NOTE — Patient Instructions (Signed)
Complete lab work prior to leaving. Complete x-ray on the first floor. Work on weight loss for your snoring.  Let me know if you decide you would like to complete the home sleep study. Call if symptoms worsen or fail to improve.

## 2018-05-22 ENCOUNTER — Encounter: Payer: Self-pay | Admitting: Family

## 2018-05-22 LAB — CBC WITH DIFFERENTIAL/PLATELET
BASOS ABS: 83 {cells}/uL (ref 0–200)
Basophils Relative: 1 %
EOS ABS: 349 {cells}/uL (ref 15–500)
Eosinophils Relative: 4.2 %
HCT: 41.6 % (ref 38.5–50.0)
HEMOGLOBIN: 14.5 g/dL (ref 13.2–17.1)
Lymphs Abs: 2075 cells/uL (ref 850–3900)
MCH: 30.1 pg (ref 27.0–33.0)
MCHC: 34.9 g/dL (ref 32.0–36.0)
MCV: 86.5 fL (ref 80.0–100.0)
MPV: 10.9 fL (ref 7.5–12.5)
Monocytes Relative: 7 %
Neutro Abs: 5212 cells/uL (ref 1500–7800)
Neutrophils Relative %: 62.8 %
Platelets: 285 10*3/uL (ref 140–400)
RBC: 4.81 10*6/uL (ref 4.20–5.80)
RDW: 12.5 % (ref 11.0–15.0)
Total Lymphocyte: 25 %
WBC mixed population: 581 cells/uL (ref 200–950)
WBC: 8.3 10*3/uL (ref 3.8–10.8)

## 2018-05-22 LAB — COMPREHENSIVE METABOLIC PANEL
AG RATIO: 2 (calc) (ref 1.0–2.5)
ALT: 21 U/L (ref 9–46)
AST: 11 U/L (ref 10–35)
Albumin: 4.5 g/dL (ref 3.6–5.1)
Alkaline phosphatase (APISO): 72 U/L (ref 40–115)
BUN: 16 mg/dL (ref 7–25)
CALCIUM: 9.5 mg/dL (ref 8.6–10.3)
CO2: 22 mmol/L (ref 20–32)
Chloride: 104 mmol/L (ref 98–110)
Creat: 0.9 mg/dL (ref 0.70–1.18)
GLUCOSE: 80 mg/dL (ref 65–99)
Globulin: 2.3 g/dL (calc) (ref 1.9–3.7)
Potassium: 4.1 mmol/L (ref 3.5–5.3)
SODIUM: 138 mmol/L (ref 135–146)
TOTAL PROTEIN: 6.8 g/dL (ref 6.1–8.1)
Total Bilirubin: 0.6 mg/dL (ref 0.2–1.2)

## 2018-05-22 LAB — LIPASE: LIPASE: 42 U/L (ref 7–60)

## 2018-05-24 ENCOUNTER — Encounter: Payer: Self-pay | Admitting: Family

## 2018-05-26 ENCOUNTER — Ambulatory Visit (HOSPITAL_BASED_OUTPATIENT_CLINIC_OR_DEPARTMENT_OTHER)
Admission: RE | Admit: 2018-05-26 | Discharge: 2018-05-26 | Disposition: A | Discharge status: Home / Self Care | Payer: Medicare HMO | Source: Ambulatory Visit | Attending: Family | Admitting: Family

## 2018-05-26 ENCOUNTER — Encounter: Payer: Self-pay | Admitting: Family

## 2018-05-26 DIAGNOSIS — G8929 Other chronic pain: Secondary | ICD-10-CM | POA: Diagnosis not present

## 2018-05-26 DIAGNOSIS — R109 Unspecified abdominal pain: Secondary | ICD-10-CM | POA: Diagnosis not present

## 2018-05-26 DIAGNOSIS — R1013 Epigastric pain: Secondary | ICD-10-CM | POA: Insufficient documentation

## 2018-06-11 DIAGNOSIS — D1801 Hemangioma of skin and subcutaneous tissue: Secondary | ICD-10-CM | POA: Diagnosis not present

## 2018-06-11 DIAGNOSIS — D224 Melanocytic nevi of scalp and neck: Secondary | ICD-10-CM | POA: Diagnosis not present

## 2018-06-11 DIAGNOSIS — L814 Other melanin hyperpigmentation: Secondary | ICD-10-CM | POA: Diagnosis not present

## 2018-06-11 DIAGNOSIS — Z85820 Personal history of malignant melanoma of skin: Secondary | ICD-10-CM | POA: Diagnosis not present

## 2018-06-11 DIAGNOSIS — L821 Other seborrheic keratosis: Secondary | ICD-10-CM | POA: Diagnosis not present

## 2018-06-27 DIAGNOSIS — K859 Acute pancreatitis without necrosis or infection, unspecified: Secondary | ICD-10-CM

## 2018-06-27 HISTORY — DX: Acute pancreatitis without necrosis or infection, unspecified: K85.90

## 2018-06-29 DIAGNOSIS — R69 Illness, unspecified: Secondary | ICD-10-CM | POA: Diagnosis not present

## 2018-07-02 DIAGNOSIS — C44319 Basal cell carcinoma of skin of other parts of face: Secondary | ICD-10-CM | POA: Diagnosis not present

## 2018-07-02 DIAGNOSIS — Z85820 Personal history of malignant melanoma of skin: Secondary | ICD-10-CM | POA: Diagnosis not present

## 2018-07-02 DIAGNOSIS — L821 Other seborrheic keratosis: Secondary | ICD-10-CM | POA: Diagnosis not present

## 2018-07-12 DIAGNOSIS — R69 Illness, unspecified: Secondary | ICD-10-CM | POA: Diagnosis not present

## 2018-07-20 ENCOUNTER — Inpatient Hospital Stay (HOSPITAL_BASED_OUTPATIENT_CLINIC_OR_DEPARTMENT_OTHER)
Admission: EM | Admit: 2018-07-20 | Discharge: 2018-07-26 | DRG: 440 | Disposition: A | Discharge status: Home / Self Care | Payer: Medicare HMO | Attending: Family Medicine | Admitting: Family Medicine

## 2018-07-20 ENCOUNTER — Encounter (HOSPITAL_BASED_OUTPATIENT_CLINIC_OR_DEPARTMENT_OTHER): Payer: Self-pay | Admitting: Emergency Medicine

## 2018-07-20 ENCOUNTER — Emergency Department (HOSPITAL_BASED_OUTPATIENT_CLINIC_OR_DEPARTMENT_OTHER): Payer: Medicare HMO

## 2018-07-20 ENCOUNTER — Other Ambulatory Visit: Payer: Self-pay

## 2018-07-20 DIAGNOSIS — R001 Bradycardia, unspecified: Secondary | ICD-10-CM | POA: Diagnosis present

## 2018-07-20 DIAGNOSIS — Z800 Family history of malignant neoplasm of digestive organs: Secondary | ICD-10-CM

## 2018-07-20 DIAGNOSIS — E162 Hypoglycemia, unspecified: Secondary | ICD-10-CM | POA: Diagnosis not present

## 2018-07-20 DIAGNOSIS — Z803 Family history of malignant neoplasm of breast: Secondary | ICD-10-CM

## 2018-07-20 DIAGNOSIS — Z811 Family history of alcohol abuse and dependence: Secondary | ICD-10-CM

## 2018-07-20 DIAGNOSIS — R079 Chest pain, unspecified: Secondary | ICD-10-CM | POA: Diagnosis not present

## 2018-07-20 DIAGNOSIS — Z8547 Personal history of malignant neoplasm of testis: Secondary | ICD-10-CM | POA: Diagnosis not present

## 2018-07-20 DIAGNOSIS — K859 Acute pancreatitis without necrosis or infection, unspecified: Principal | ICD-10-CM | POA: Diagnosis present

## 2018-07-20 DIAGNOSIS — Z923 Personal history of irradiation: Secondary | ICD-10-CM

## 2018-07-20 DIAGNOSIS — Z85820 Personal history of malignant melanoma of skin: Secondary | ICD-10-CM | POA: Diagnosis not present

## 2018-07-20 DIAGNOSIS — E669 Obesity, unspecified: Secondary | ICD-10-CM | POA: Diagnosis present

## 2018-07-20 DIAGNOSIS — Z87891 Personal history of nicotine dependence: Secondary | ICD-10-CM

## 2018-07-20 DIAGNOSIS — Z6834 Body mass index (BMI) 34.0-34.9, adult: Secondary | ICD-10-CM

## 2018-07-20 DIAGNOSIS — Z820 Family history of epilepsy and other diseases of the nervous system: Secondary | ICD-10-CM

## 2018-07-20 DIAGNOSIS — Z9079 Acquired absence of other genital organ(s): Secondary | ICD-10-CM

## 2018-07-20 HISTORY — DX: Neoplasm of unspecified behavior of other genitourinary organ: D49.59

## 2018-07-20 LAB — CBC WITH DIFFERENTIAL/PLATELET
Abs Immature Granulocytes: 0.03 10*3/uL (ref 0.00–0.07)
Basophils Absolute: 0 10*3/uL (ref 0.0–0.1)
Basophils Relative: 0 %
Eosinophils Absolute: 0 10*3/uL (ref 0.0–0.5)
Eosinophils Relative: 0 %
HCT: 44.7 % (ref 39.0–52.0)
Hemoglobin: 14.2 g/dL (ref 13.0–17.0)
IMMATURE GRANULOCYTES: 0 %
Lymphocytes Relative: 8 %
Lymphs Abs: 0.9 10*3/uL (ref 0.7–4.0)
MCH: 29.3 pg (ref 26.0–34.0)
MCHC: 31.8 g/dL (ref 30.0–36.0)
MCV: 92.2 fL (ref 80.0–100.0)
Monocytes Absolute: 0.5 10*3/uL (ref 0.1–1.0)
Monocytes Relative: 4 %
Neutro Abs: 9.7 10*3/uL — ABNORMAL HIGH (ref 1.7–7.7)
Neutrophils Relative %: 88 %
Platelets: 198 10*3/uL (ref 150–400)
RBC: 4.85 MIL/uL (ref 4.22–5.81)
RDW: 12.4 % (ref 11.5–15.5)
WBC: 11.1 10*3/uL — AB (ref 4.0–10.5)
nRBC: 0 % (ref 0.0–0.2)

## 2018-07-20 LAB — COMPREHENSIVE METABOLIC PANEL
ALT: 70 U/L — ABNORMAL HIGH (ref 0–44)
AST: 92 U/L — AB (ref 15–41)
Albumin: 3.8 g/dL (ref 3.5–5.0)
Alkaline Phosphatase: 67 U/L (ref 38–126)
Anion gap: 10 (ref 5–15)
BUN: 18 mg/dL (ref 8–23)
CO2: 22 mmol/L (ref 22–32)
Calcium: 8.9 mg/dL (ref 8.9–10.3)
Chloride: 110 mmol/L (ref 98–111)
Creatinine, Ser: 0.83 mg/dL (ref 0.61–1.24)
GFR calc Af Amer: >60 mL/min (ref >60)
GFR calc non Af Amer: >60 mL/min (ref >60)
Glucose, Bld: 108 mg/dL — ABNORMAL HIGH (ref 70–99)
Potassium: 3.3 mmol/L — ABNORMAL LOW (ref 3.5–5.1)
Sodium: 142 mmol/L (ref 135–145)
Total Bilirubin: 1.1 mg/dL (ref 0.3–1.2)
Total Protein: 6.3 g/dL — ABNORMAL LOW (ref 6.5–8.1)

## 2018-07-20 LAB — TROPONIN I: Troponin I: <0.03 ng/mL (ref <0.03)

## 2018-07-20 LAB — LIPASE, BLOOD: Lipase: 3585 U/L — ABNORMAL HIGH (ref 11–51)

## 2018-07-20 MED ORDER — MORPHINE SULFATE (PF) 4 MG/ML IV SOLN
4.0000 mg | Freq: Once | INTRAVENOUS | Status: AC
  Administered 2018-07-20: 4 mg via INTRAVENOUS
  Filled 2018-07-20: qty 1

## 2018-07-20 MED ORDER — PROMETHAZINE HCL 25 MG/ML IJ SOLN: 25.0000 mg | Freq: Once | INTRAMUSCULAR | Status: DC

## 2018-07-20 MED ORDER — METOCLOPRAMIDE HCL 5 MG/ML IJ SOLN
10.0000 mg | Freq: Once | INTRAMUSCULAR | Status: AC
  Administered 2018-07-20: 10 mg via INTRAVENOUS
  Filled 2018-07-20: qty 2

## 2018-07-20 MED ORDER — SODIUM CHLORIDE 0.9 % IV SOLN
Freq: Once | INTRAVENOUS | Status: AC
  Administered 2018-07-20: 21:00:00 via INTRAVENOUS

## 2018-07-20 MED ORDER — IOPAMIDOL (ISOVUE-300) INJECTION 61%
100.0000 mL | Freq: Once | INTRAVENOUS | Status: AC | PRN
  Administered 2018-07-20: 100 mL via INTRAVENOUS

## 2018-07-20 MED ORDER — PROMETHAZINE HCL 25 MG/ML IJ SOLN
12.5000 mg | Freq: Once | INTRAMUSCULAR | Status: AC
  Administered 2018-07-20: 12.5 mg via INTRAVENOUS
  Filled 2018-07-20: qty 1

## 2018-07-20 MED ORDER — HYDROMORPHONE HCL 1 MG/ML IJ SOLN
0.5000 mg | Freq: Once | INTRAMUSCULAR | Status: AC
  Administered 2018-07-20: 0.5 mg via INTRAVENOUS
  Filled 2018-07-20: qty 1

## 2018-07-20 MED ORDER — HYDROMORPHONE HCL 1 MG/ML IJ SOLN
1.0000 mg | Freq: Once | INTRAMUSCULAR | Status: AC
  Administered 2018-07-20: 1 mg via INTRAVENOUS
  Filled 2018-07-20: qty 1

## 2018-07-20 MED ORDER — ONDANSETRON HCL 4 MG/2ML IJ SOLN
4.0000 mg | Freq: Once | INTRAMUSCULAR | Status: AC
  Administered 2018-07-20: 4 mg via INTRAVENOUS
  Filled 2018-07-20: qty 2

## 2018-07-20 MED ORDER — FAMOTIDINE IN NACL 20-0.9 MG/50ML-% IV SOLN
20.0000 mg | Freq: Once | INTRAVENOUS | Status: AC
  Administered 2018-07-20: 20 mg via INTRAVENOUS
  Filled 2018-07-20: qty 50

## 2018-07-20 MED ORDER — SODIUM CHLORIDE 0.9 % IV BOLUS
1000.0000 mL | Freq: Once | INTRAVENOUS | Status: AC
  Administered 2018-07-20: 1000 mL via INTRAVENOUS

## 2018-07-20 NOTE — ED Notes (Signed)
Updated pt's wife that there are a lot of [place] the [place] that [place] and that the nurse would let her know when we hear from [place].

## 2018-07-20 NOTE — ED Notes (Signed)
Pt in CT.

## 2018-07-20 NOTE — ED Notes (Signed)
Attempted another IV stick in left AC without success

## 2018-07-20 NOTE — ED Notes (Addendum)
Updated pt's family to approximate time for transportation, also letting them know that there could be delays depending on how many patients are getting transferred tonight. Let wife know that the nurse would come in after [facility] calls in order to give her a better idea of the wait time.

## 2018-07-20 NOTE — ED Triage Notes (Signed)
Patient states that she is having SOB and epigastric pain x 2 - 3 hours - patient is very diaphoretic

## 2018-07-20 NOTE — ED Notes (Signed)
Pt placed on 2L O2 as his saturation is 90% while asleep and he is getting dilaudid

## 2018-07-20 NOTE — ED Notes (Signed)
Updated pt's wife again to wait time and that there is another patient waiting on transport as well.

## 2018-07-20 NOTE — ED Notes (Signed)
Patient brought straight back to room 14 and placed on  Monitor. MD to the bedside. IV x 2 attempted - MD placed IV under U/s

## 2018-07-20 NOTE — ED Provider Notes (Signed)
[facility] EMERGENCY DEPARTMENT Provider Note   CSN: [id number] Arrival date & time: 07/20/18  1544     History   Chief Complaint Chief Complaint  Patient presents with  . Chest Pain    HPI Stephen Evans is a 72 y.o. male.  Patient is a 72 year old male with a history of cancer in remission who is presenting today with sudden onset of severe epigastric discomfort, nausea, vomiting, bloating and lightheadedness.  Patient states he was fine this morning and he was driving back from [facility] when they stopped at a gas station he got some milk and crackers.  Wife states almost immediately after he finished drinking the milk he started experiencing bloating in his stomach and pressure.  The abdominal discomfort continued to build and he started having vomiting when he got home which he described as mostly bile.  He continues to feel weak and sweaty.  Wife states he was panting but patient denies shortness of breath.  He has no radiation of pain into his back.  He denies any urinary symptoms and his bowel movements have been normal.  The history is provided by the patient.  Abdominal Pain   This is a recurrent problem. The current episode started 3 to 5 hours ago. The problem occurs constantly. The problem has not changed since onset.Associated with: started right after he drank some milk from the gas station. The pain is located in the epigastric region. The pain is at a severity of 10/10. The pain is severe. Associated symptoms include belching, nausea and vomiting. Pertinent negatives include fever, diarrhea, dysuria and frequency. Nothing aggravates the symptoms. Nothing relieves the symptoms.    Past Medical History:  Diagnosis Date  . Melanoma ([place]) x 2  . Testicular cancer [facility]) 2011   testicular--radiation    Patient Active Problem List   Diagnosis Date Noted  . Trapezius strain 04/05/2014  . Routine general medical examination at a health care facility 09/22/2012   . Nonspecific abnormal electrocardiogram (ECG) (EKG) 09/22/2012  . Personal history of malignant melanoma 09/06/2012  . History of testicular cancer 09/06/2012  . Androgen deficiency 09/06/2012    Past Surgical History:  Procedure Laterality Date  . LYMPH GLAND EXCISION Right 1979   axillary, benign  . MELANOMA EXCISION Right 03/2014   elbow  . ORCHIECTOMY Right 2001   followed by radiation  . TONSILLECTOMY  1959  . wide excision of melanoma on back  1979        Home Medications    Prior to Admission medications   Medication Sig Start Date End Date Taking? Authorizing Provider  omeprazole (PRILOSEC) 40 MG capsule Take 1 capsule (40 mg total) by mouth daily. 05/21/18   [name], NP  Probiotic Product (PROBIOTIC PO) Take 1 tablet by mouth daily.    [provider]  sildenafil (REVATIO) 20 MG tablet 1-2 tabs by mouth 30 minutes prior to sexual activity 07/01/17   [name], NP  Testosterone 277 MG PLLT 1 application to arm daily. 09/03/17   [provider]    Family History Family History  Problem Relation Age of Onset  . Epilepsy Mother   . Esophageal cancer Father         smoker/drinker died at 4  . Breast cancer Sister         diagnosed at 77  . Alcohol abuse Brother     Social History Social History   Tobacco Use  . Smoking status: Former Smoker  Years: 30.00    Types: Cigarettes    Last attempt to quit: 09/06/1997    Years since quitting: 20.8  . Smokeless tobacco: Never Used  Substance Use Topics  . Alcohol use: Yes    Alcohol/week: 7.0 standard drinks    Types: 7 Glasses of wine per week    Comment: 1 glass wine/day and shot 5 days per week  . Drug use: No     Allergies   Patient has no known allergies.   Review of Systems Review of Systems  Constitutional: Negative for fever.  Gastrointestinal: Positive for abdominal pain, nausea and vomiting. Negative for diarrhea.  Genitourinary: Negative for dysuria  and frequency.  All other systems reviewed and are negative.    Physical Exam Updated Vital Signs BP (!) 155/93   Pulse (!) 43   Resp 13   Ht 5\' 8"  (1.727 m)   Wt 99.8 kg   SpO2 100%   BMI 33.45 kg/m   Physical Exam Vitals signs and nursing note reviewed.  Constitutional:      General: He is not in acute distress.    Appearance: He is well-developed. He is ill-appearing and diaphoretic.  HENT:     Head: Normocephalic and atraumatic.  Eyes:     Conjunctiva/sclera: Conjunctivae normal.     Pupils: Pupils are equal, round, and reactive to light.  Neck:     Musculoskeletal: Normal range of motion and neck supple.  Cardiovascular:     Rate and Rhythm: Regular rhythm. Bradycardia present.     Heart sounds: No murmur.  Pulmonary:     Effort: Pulmonary effort is normal. No respiratory distress.     Breath sounds: Normal breath sounds. No wheezing or rales.  Abdominal:     General: There is no distension.     Palpations: Abdomen is soft.     Tenderness: There is abdominal tenderness in the right upper quadrant and epigastric area. There is guarding. There is no rebound.  Musculoskeletal: Normal range of motion.        General: No tenderness.  Skin:    General: Skin is warm.     Findings: No erythema or rash.  Neurological:     Mental Status: He is alert and oriented to person, place, and time.  Psychiatric:        Behavior: Behavior normal.      ED Treatments / Results  Labs (all labs ordered are listed, but only abnormal results are displayed) Labs Reviewed  CBC WITH DIFFERENTIAL/PLATELET - Abnormal; Notable for the following components:      Result Value   WBC 11.1 (*)    Neutro Abs 9.7 (*)    All other components within normal limits  COMPREHENSIVE METABOLIC PANEL - Abnormal; Notable for the following components:   Potassium 3.3 (*)    Glucose, Bld 108 (*)    Total Protein 6.3 (*)    AST 92 (*)    ALT 70 (*)    All other components within normal limits    LIPASE, BLOOD - Abnormal; Notable for the following components:   Lipase 3,585 (*)    All other components within normal limits  TROPONIN I  URINALYSIS, ROUTINE W REFLEX MICROSCOPIC    EKG EKG Interpretation  Date/Time:  Tuesday July 20 2018 15:48:28 EST Ventricular Rate:  42 PR Interval:  182 QRS Duration: 90 QT Interval:  506 QTC Calculation: 422 R Axis:   66 Text Interpretation:  Marked sinus bradycardia No previous tracing Confirmed  by Blanchie Dessert [phone]) on 07/20/2018 3:55:18 PM   Radiology Ct Abdomen Pelvis W Contrast  Result Date: 07/20/2018 CLINICAL DATA:  Dyspnea, epigastric pain and diaphoresis. EXAM: CT ABDOMEN AND PELVIS WITH CONTRAST TECHNIQUE: Multidetector CT imaging of the abdomen and pelvis was performed using the standard protocol following bolus administration of intravenous contrast. CONTRAST:  16mL ISOVUE-300 IOPAMIDOL (ISOVUE-300) INJECTION 61% COMPARISON:  None. FINDINGS: Lower chest: Dependent bibasilar atelectasis. Top-normal size heart. Hepatobiliary: Tiny cyst or hemangioma in the right hepatic lobe near the dome measuring 7 mm. This too small to further characterize. Pancreas: Peripancreatic inflammation without abscess. No pancreatic gland necrosis or pseudocyst formation. No ductal dilatation. No stones. Spleen: Normal Adrenals/Urinary Tract: Normal Stomach/Bowel: Stomach is within normal limits. Appendix appears normal. No evidence of bowel wall thickening, distention, or inflammatory changes. Vascular/Lymphatic: Mild moderate aortoiliac atherosclerosis. No aneurysm or dissection. No lymphadenopathy. Reproductive: Normal size prostate and seminal vesicles. Other: No free air nor free fluid. Tiny periumbilical fat containing hernia. Musculoskeletal: Mild degenerative change along the lower thoracic spine. No acute osseous appearing abnormality. IMPRESSION: 1. Acute uncomplicated pancreatitis. 2. 7 mm cyst or hemangioma in the right hepatic lobe. 3.  Aortoiliac atherosclerosis without aneurysm or dissection. Electronically Signed   By: Ashley Royalty M.D.   On: 07/20/2018 19:49    Procedures Procedures (including critical care time)  Medications Ordered in ED Medications  famotidine (PEPCID) IVPB 20 mg premix (20 mg Intravenous New Bag/Given 07/20/18 1651)  sodium chloride 0.9 % bolus 1,000 mL (1,000 mLs Intravenous New Bag/Given 07/20/18 1649)  morphine 4 MG/ML injection 4 mg (4 mg Intravenous Given 07/20/18 1641)  ondansetron (ZOFRAN) injection 4 mg (4 mg Intravenous Given 07/20/18 1641)     Initial Impression / Assessment and Plan / ED Course  I have reviewed the triage vital signs and the nursing notes.  Pertinent labs & imaging results that were available during my care of the patient were reviewed by me and considered in my medical decision making (see chart for details).     72 year old male presenting today with abrupt onset of severe abdominal discomfort and bloating that then resulted in nausea and vomiting.  This started almost immediately after drinking some milk and eating crackers.  Patient on exam is bradycardic with epigastric and right upper quadrant tenderness.  Patient states that he has had some issues with abdominal bloating, belching and issues over the last few months but nothing to this extent.  He felt completely normal this morning.  He denies any urinary symptoms.  No prior surgeries on his abdomen but he has had lower abdominal radiation for testicular cancer.  Patient was bradycardic and diaphoretic when he got here which seemed more to be vagal response to the pain.  Pressure was slightly elevated.  She had an ultrasound done a few months ago which showed a normal gallbladder so lower suspicion for gallstones.  Concern for possible perforated gastric ulcer, esophageal spasm, obstruction, pancreatitis, hepatitis.  Lower suspicion for appendicitis or diverticulitis.  CBC, CMP, lipase, UA, lactate and CT of the  abdomen and pelvis pending.  Patient given pain medication nausea medication, Pepcid and IV fluids.  11:12 PM Patient's labs are consistent with mild leukocytosis of 11,000, mild elevation of LFTs with AST of 92 and ALT of 70.  Lipase today is greater than 3000 which is most likely the cause of his symptoms.  CT is consistent with uncomplicated pancreatitis.  Patient continues to have pain and vomiting.  Will admit for further care.  Final Clinical Impressions(s) / ED Diagnoses   Final diagnoses:  Acute pancreatitis without infection or necrosis, unspecified pancreatitis type    ED Discharge Orders    None       Blanchie Dessert, MD 07/20/18 2313

## 2018-07-21 ENCOUNTER — Inpatient Hospital Stay (HOSPITAL_COMMUNITY): Payer: Medicare HMO

## 2018-07-21 ENCOUNTER — Encounter (HOSPITAL_COMMUNITY): Payer: Self-pay | Admitting: Internal Medicine

## 2018-07-21 DIAGNOSIS — Z8547 Personal history of malignant neoplasm of testis: Secondary | ICD-10-CM | POA: Diagnosis not present

## 2018-07-21 DIAGNOSIS — R001 Bradycardia, unspecified: Secondary | ICD-10-CM | POA: Diagnosis not present

## 2018-07-21 DIAGNOSIS — Z9079 Acquired absence of other genital organ(s): Secondary | ICD-10-CM | POA: Diagnosis not present

## 2018-07-21 DIAGNOSIS — Z923 Personal history of irradiation: Secondary | ICD-10-CM | POA: Diagnosis not present

## 2018-07-21 DIAGNOSIS — K859 Acute pancreatitis without necrosis or infection, unspecified: Secondary | ICD-10-CM | POA: Diagnosis present

## 2018-07-21 DIAGNOSIS — R079 Chest pain, unspecified: Secondary | ICD-10-CM | POA: Diagnosis present

## 2018-07-21 DIAGNOSIS — R69 Illness, unspecified: Secondary | ICD-10-CM | POA: Diagnosis not present

## 2018-07-21 DIAGNOSIS — Z811 Family history of alcohol abuse and dependence: Secondary | ICD-10-CM | POA: Diagnosis not present

## 2018-07-21 DIAGNOSIS — Z6834 Body mass index (BMI) 34.0-34.9, adult: Secondary | ICD-10-CM | POA: Diagnosis not present

## 2018-07-21 DIAGNOSIS — E162 Hypoglycemia, unspecified: Secondary | ICD-10-CM | POA: Diagnosis not present

## 2018-07-21 DIAGNOSIS — Z800 Family history of malignant neoplasm of digestive organs: Secondary | ICD-10-CM | POA: Diagnosis not present

## 2018-07-21 DIAGNOSIS — Z820 Family history of epilepsy and other diseases of the nervous system: Secondary | ICD-10-CM | POA: Diagnosis not present

## 2018-07-21 DIAGNOSIS — Z85820 Personal history of malignant melanoma of skin: Secondary | ICD-10-CM | POA: Diagnosis not present

## 2018-07-21 DIAGNOSIS — Z803 Family history of malignant neoplasm of breast: Secondary | ICD-10-CM | POA: Diagnosis not present

## 2018-07-21 DIAGNOSIS — Z87891 Personal history of nicotine dependence: Secondary | ICD-10-CM | POA: Diagnosis not present

## 2018-07-21 DIAGNOSIS — E669 Obesity, unspecified: Secondary | ICD-10-CM | POA: Diagnosis present

## 2018-07-21 LAB — CBC WITH DIFFERENTIAL/PLATELET
ABS IMMATURE GRANULOCYTES: 0.07 10*3/uL (ref 0.00–0.07)
Basophils Absolute: 0 10*3/uL (ref 0.0–0.1)
Basophils Relative: 0 %
Eosinophils Absolute: 0 10*3/uL (ref 0.0–0.5)
Eosinophils Relative: 0 %
HCT: 42.3 % (ref 39.0–52.0)
Hemoglobin: 13.7 g/dL (ref 13.0–17.0)
IMMATURE GRANULOCYTES: 1 %
Lymphocytes Relative: 6 %
Lymphs Abs: 0.7 10*3/uL (ref 0.7–4.0)
MCH: 30.7 pg (ref 26.0–34.0)
MCHC: 32.4 g/dL (ref 30.0–36.0)
MCV: 94.8 fL (ref 80.0–100.0)
Monocytes Absolute: 0.6 10*3/uL (ref 0.1–1.0)
Monocytes Relative: 5 %
NEUTROS PCT: 88 %
Neutro Abs: 11.3 10*3/uL — ABNORMAL HIGH (ref 1.7–7.7)
Platelets: 188 10*3/uL (ref 150–400)
RBC: 4.46 MIL/uL (ref 4.22–5.81)
RDW: 12.7 % (ref 11.5–15.5)
WBC: 12.7 10*3/uL — ABNORMAL HIGH (ref 4.0–10.5)
nRBC: 0 % (ref 0.0–0.2)

## 2018-07-21 LAB — BASIC METABOLIC PANEL
Anion gap: 9 (ref 5–15)
BUN: 16 mg/dL (ref 8–23)
CHLORIDE: 107 mmol/L (ref 98–111)
CO2: 24 mmol/L (ref 22–32)
Calcium: 8.4 mg/dL — ABNORMAL LOW (ref 8.9–10.3)
Creatinine, Ser: 0.97 mg/dL (ref 0.61–1.24)
GFR calc Af Amer: >60 mL/min (ref >60)
GFR calc non Af Amer: >60 mL/min (ref >60)
Glucose, Bld: 126 mg/dL — ABNORMAL HIGH (ref 70–99)
Potassium: 4 mmol/L (ref 3.5–5.1)
SODIUM: 140 mmol/L (ref 135–145)

## 2018-07-21 LAB — HEPATIC FUNCTION PANEL
ALK PHOS: 60 U/L (ref 38–126)
ALT: 60 U/L — ABNORMAL HIGH (ref 0–44)
AST: 37 U/L (ref 15–41)
Albumin: 3.6 g/dL (ref 3.5–5.0)
Bilirubin, Direct: 0.1 mg/dL (ref 0.0–0.2)
Indirect Bilirubin: 0.6 mg/dL (ref 0.3–0.9)
Total Bilirubin: 0.7 mg/dL (ref 0.3–1.2)
Total Protein: 6.1 g/dL — ABNORMAL LOW (ref 6.5–8.1)

## 2018-07-21 LAB — URINALYSIS, ROUTINE W REFLEX MICROSCOPIC
Bilirubin Urine: NEGATIVE
GLUCOSE, UA: NEGATIVE mg/dL
HGB URINE DIPSTICK: NEGATIVE
Ketones, ur: 20 mg/dL — AB
Leukocytes, UA: NEGATIVE
Nitrite: NEGATIVE
Protein, ur: NEGATIVE mg/dL
Specific Gravity, Urine: 1.041 — ABNORMAL HIGH (ref 1.005–1.030)
pH: 5 (ref 5.0–8.0)

## 2018-07-21 LAB — MAGNESIUM: Magnesium: 1.8 mg/dL (ref 1.7–2.4)

## 2018-07-21 LAB — GLUCOSE, CAPILLARY
GLUCOSE-CAPILLARY: 126 mg/dL — AB (ref 70–99)
GLUCOSE-CAPILLARY: 92 mg/dL (ref 70–99)

## 2018-07-21 LAB — TRIGLYCERIDES: Triglycerides: 28 mg/dL (ref <150)

## 2018-07-21 LAB — TSH: TSH: 0.717 u[IU]/mL (ref 0.350–4.500)

## 2018-07-21 LAB — LACTIC ACID, PLASMA: Lactic Acid, Venous: 1.9 mmol/L (ref 0.5–1.9)

## 2018-07-21 LAB — C-REACTIVE PROTEIN: CRP: 1.9 mg/dL — ABNORMAL HIGH (ref <1.0)

## 2018-07-21 LAB — TROPONIN I: Troponin I: <0.03 ng/mL (ref <0.03)

## 2018-07-21 LAB — LIPASE, BLOOD: LIPASE: 896 U/L — AB (ref 11–51)

## 2018-07-21 MED ORDER — DEXTROSE-NACL 5-0.9 % IV SOLN
INTRAVENOUS | Status: DC
  Administered 2018-07-21 – 2018-07-26 (×7): via INTRAVENOUS

## 2018-07-21 MED ORDER — ACETAMINOPHEN 650 MG RE SUPP: 650.0000 mg | Freq: Four times a day (QID) | RECTAL | Status: DC | PRN

## 2018-07-21 MED ORDER — HYDROMORPHONE HCL 1 MG/ML IJ SOLN
0.5000 mg | INTRAMUSCULAR | Status: DC | PRN
  Administered 2018-07-21 (×3): 0.5 mg via INTRAVENOUS
  Filled 2018-07-21 (×3): qty 0.5

## 2018-07-21 MED ORDER — PROMETHAZINE HCL 25 MG/ML IJ SOLN
12.5000 mg | Freq: Once | INTRAMUSCULAR | Status: AC
  Administered 2018-07-21: 12.5 mg via INTRAVENOUS

## 2018-07-21 MED ORDER — ONDANSETRON HCL 4 MG/2ML IJ SOLN
4.0000 mg | INTRAMUSCULAR | Status: DC | PRN
  Administered 2018-07-21 (×2): 4 mg via INTRAVENOUS
  Filled 2018-07-21 (×2): qty 2

## 2018-07-21 MED ORDER — ONDANSETRON HCL 4 MG/2ML IJ SOLN
4.0000 mg | INTRAMUSCULAR | Status: AC
  Administered 2018-07-21: 4 mg via INTRAVENOUS
  Filled 2018-07-21: qty 2

## 2018-07-21 MED ORDER — ONDANSETRON HCL 4 MG/5ML PO SOLN
4.0000 mg | ORAL | Status: DC | PRN
  Filled 2018-07-21: qty 5

## 2018-07-21 MED ORDER — HYDROMORPHONE HCL 1 MG/ML IJ SOLN
0.5000 mg | INTRAMUSCULAR | Status: DC | PRN
  Administered 2018-07-21 – 2018-07-25 (×24): 0.5 mg via INTRAVENOUS
  Filled 2018-07-21 (×24): qty 0.5

## 2018-07-21 MED ORDER — LACTATED RINGERS IV SOLN
INTRAVENOUS | Status: DC
  Administered 2018-07-21: 05:00:00 via INTRAVENOUS

## 2018-07-21 MED ORDER — PROMETHAZINE HCL 25 MG/ML IJ SOLN
12.5000 mg | Freq: Four times a day (QID) | INTRAMUSCULAR | Status: DC | PRN
  Administered 2018-07-21 – 2018-07-23 (×6): 12.5 mg via INTRAVENOUS
  Filled 2018-07-21 (×7): qty 1

## 2018-07-21 MED ORDER — LACTATED RINGERS IV BOLUS
2000.0000 mL | Freq: Once | INTRAVENOUS | Status: AC
  Administered 2018-07-21: 2000 mL via INTRAVENOUS

## 2018-07-21 MED ORDER — ACETAMINOPHEN 325 MG PO TABS: 650.0000 mg | ORAL_TABLET | Freq: Four times a day (QID) | ORAL | Status: DC | PRN

## 2018-07-21 NOTE — ED Notes (Signed)
Report given to [place], Therapist, sports at [facility]

## 2018-07-21 NOTE — ED Notes (Signed)
Updated pt that [facility] is on the way

## 2018-07-21 NOTE — H&P (Addendum)
History and Physical    Stephen Evans ZOX:[id number] DOB: 1945-11-10 DOA: 07/20/2018  PCP: [name], NP  Patient coming from: Home.  Chief Complaint: Abdominal pain.  HPI: Stephen Evans is a 72 y.o. male with history of testicular cancer status post orchiectomy and radiation and history of melanoma status post surgery and lymph node dissection of the right axilla started having severe abdominal pain on way back from [facility] to home yesterday around noon time.  Pain was in the epigastric area radiating to the back with multiple episodes of vomiting.  Denies any diarrhea fever chills.  Denies any chest pain or shortness of breath.  Patient states he drinks wine every day.  ED Course: In the ER labs revealed mildly elevated LFTs with AST of 92 ALT of 70 and lipase of 3585.  CT scan of the abdomen and pelvis done with contrast shows acute uncomplicated pancreatitis and also a hemangioma of the liver.  Patient was given fluid bolus and admitted for further management of acute pancreatitis.  Could be from alcohol.  In addition patient is found to be bradycardic with EKG showing sinus bradycardia heart rate around 42 bpm.  Patient states he has not had any previous history of bradycardia.  Review of Systems: As per HPI, rest all negative.   Past Medical History:  Diagnosis Date  . Melanoma (Stephen Evans) x 2  . Melanoma (Stephen Evans)   . Melanoma (Stephen Evans)   . Testicular cancer ([place]) 2011   testicular--radiation  . Testicular tumor     Past Surgical History:  Procedure Laterality Date  . LYMPH GLAND EXCISION Right 1979   axillary, benign  . MELANOMA EXCISION Right 03/2014   elbow  . ORCHIECTOMY Right 2001   followed by radiation  . TONSILLECTOMY  1959  . wide excision of melanoma on back  1979     reports that he quit smoking about 20 years ago. His smoking use included cigarettes. He quit after 30.00 years of use. He has never used smokeless tobacco. He reports current alcohol use of about  7.0 standard drinks of alcohol per week. He reports that he does not use drugs.  No Known Allergies  Family History  Problem Relation Age of Onset  . Epilepsy Mother   . Esophageal cancer Father         smoker/drinker died at 23  . Breast cancer Sister         diagnosed at 2  . Alcohol abuse Brother     Prior to Admission medications   Medication Sig Start Date End Date Taking? Authorizing Provider  omeprazole (PRILOSEC) 40 MG capsule Take 1 capsule (40 mg total) by mouth daily. 05/21/18   [name], NP  Probiotic Product (PROBIOTIC PO) Take 1 tablet by mouth daily.    [provider]  sildenafil (REVATIO) 20 MG tablet 1-2 tabs by mouth 30 minutes prior to sexual activity 07/01/17   [name], NP  Testosterone 811 MG PLLT 1 application to arm daily. 09/03/17   [provider]    Physical Exam: Vitals:   07/20/18 2320 07/20/18 2330 07/21/18 0000 07/21/18 0127  BP:  (!) 144/65 (!) 153/69 (!) 163/72  Pulse: (!) 53 (!) 46 (!) 46 (!) 44  Resp: 18 15 16 16   Temp:    98.5 F (36.9 C)  TempSrc:    Oral  SpO2: 96% 95% 96% 99%  Weight:      Height:  Constitutional: Moderately built and nourished. Vitals:   07/20/18 2320 07/20/18 2330 07/21/18 0000 07/21/18 0127  BP:  (!) 144/65 (!) 153/69 (!) 163/72  Pulse: (!) 53 (!) 46 (!) 46 (!) 44  Resp: 18 15 16 16   Temp:    98.5 F (36.9 C)  TempSrc:    Oral  SpO2: 96% 95% 96% 99%  Weight:      Height:       Eyes: Anicteric no pallor. ENMT: No discharge from the ears eyes nose or mouth. Neck: No mass felt.  No neck rigidity.  No JVD appreciated. Respiratory: No rhonchi or crepitations. Cardiovascular: S1-S2 heard. Abdomen: Soft epigastric tenderness present.  No rebound tenderness no guarding.  Bowel sounds appreciated. Musculoskeletal: No edema. Skin: No rash. Neurologic: Alert awake oriented to time place and person.  Moves all extremities. Psychiatric: Appears normal.  Normal  affect.   Labs on Admission: I have personally reviewed following labs and imaging studies  CBC: Recent Labs  Lab 07/20/18 1740  WBC 11.1*  NEUTROABS 9.7*  HGB 14.2  HCT 44.7  MCV 92.2  PLT 660   Basic Metabolic Panel: Recent Labs  Lab 07/20/18 1740  NA 142  K 3.3*  CL 110  CO2 22  GLUCOSE 108*  BUN 18  CREATININE 0.83  CALCIUM 8.9   GFR: Estimated Creatinine Clearance: 94.9 mL/min (by C-G formula based on SCr of 0.83 mg/dL). Liver Function Tests: Recent Labs  Lab 07/20/18 1740  AST 92*  ALT 70*  ALKPHOS 67  BILITOT 1.1  PROT 6.3*  ALBUMIN 3.8   Recent Labs  Lab 07/20/18 1740  LIPASE 3,585*   No results for input(s): AMMONIA in the last 168 hours. Coagulation Profile: No results for input(s): INR, PROTIME in the last 168 hours. Cardiac Enzymes: Recent Labs  Lab 07/20/18 1740  TROPONINI <0.03   BNP (last 3 results) No results for input(s): PROBNP in the last 8760 hours. HbA1C: No results for input(s): HGBA1C in the last 72 hours. CBG: No results for input(s): GLUCAP in the last 168 hours. Lipid Profile: No results for input(s): CHOL, HDL, LDLCALC, TRIG, CHOLHDL, LDLDIRECT in the last 72 hours. Thyroid Function Tests: No results for input(s): TSH, T4TOTAL, FREET4, T3FREE, THYROIDAB in the last 72 hours. Anemia Panel: No results for input(s): VITAMINB12, FOLATE, FERRITIN, TIBC, IRON, RETICCTPCT in the last 72 hours. Urine analysis:    Component Value Date/Time   COLORURINE YELLOW 09/15/2012 0831   APPEARANCEUR CLEAR 09/15/2012 0831   LABSPEC 1.012 09/15/2012 0831   PHURINE 6.5 09/15/2012 0831   GLUCOSEU NEG 09/15/2012 0831   HGBUR NEG 09/15/2012 0831   BILIRUBINUR NEG 09/15/2012 0831   KETONESUR NEG 09/15/2012 0831   PROTEINUR NEG 09/15/2012 0831   UROBILINOGEN 0.2 09/15/2012 0831   NITRITE NEG 09/15/2012 0831   LEUKOCYTESUR NEG 09/15/2012 0831   Sepsis Labs: @LABRCNTIP (procalcitonin:4,lacticidven:4) )No results found for this or any  previous visit (from the past 240 hour(s)).   Radiological Exams on Admission: Ct Abdomen Pelvis W Contrast  Result Date: 07/20/2018 CLINICAL DATA:  Dyspnea, epigastric pain and diaphoresis. EXAM: CT ABDOMEN AND PELVIS WITH CONTRAST TECHNIQUE: Multidetector CT imaging of the abdomen and pelvis was performed using the standard protocol following bolus administration of intravenous contrast. CONTRAST:  177mL ISOVUE-300 IOPAMIDOL (ISOVUE-300) INJECTION 61% COMPARISON:  None. FINDINGS: Lower chest: Dependent bibasilar atelectasis. Top-normal size heart. Hepatobiliary: Tiny cyst or hemangioma in the right hepatic lobe near the dome measuring 7 mm. This too small to further characterize. Pancreas:  Peripancreatic inflammation without abscess. No pancreatic gland necrosis or pseudocyst formation. No ductal dilatation. No stones. Spleen: Normal Adrenals/Urinary Tract: Normal Stomach/Bowel: Stomach is within normal limits. Appendix appears normal. No evidence of bowel wall thickening, distention, or inflammatory changes. Vascular/Lymphatic: Mild moderate aortoiliac atherosclerosis. No aneurysm or dissection. No lymphadenopathy. Reproductive: Normal size prostate and seminal vesicles. Other: No free air nor free fluid. Tiny periumbilical fat containing hernia. Musculoskeletal: Mild degenerative change along the lower thoracic spine. No acute osseous appearing abnormality. IMPRESSION: 1. Acute uncomplicated pancreatitis. 2. 7 mm cyst or hemangioma in the right hepatic lobe. 3. Aortoiliac atherosclerosis without aneurysm or dissection. Electronically Signed   By: Ashley Royalty M.D.   On: 07/20/2018 19:49    EKG: Independently reviewed.  Sinus bradycardia.  QTC of 422 ms QRS of 90 ms.  Assessment/Plan Principal Problem:   Acute pancreatitis without infection or necrosis Active Problems:   Personal history of malignant melanoma   History of testicular cancer   Sinus bradycardia   Acute pancreatitis    1. Acute  pancreatitis -patient still having pain and nausea and vomiting.  Continue with aggressive hydration I have ordered LR 2 boluses along with infusion.  Patient has already received 1 L bolus.  Will check repeat LFTs lipase CRP hemoglobin hematocrit WBC and closely follow intake output metabolic panel.  In addition we will also check troponin.  May have to consider MRCP if LFTs go up. 2. Sinus bradycardia cause not clear.  Check TSH continue to monitor in telemetry for any further worsening. 3. History of melanoma and testicular cancer in remission. 4. Mildly elevated LFTs could be from alcohol.  Follow LFTs.  Check acute hepatitis panel. 5. Patient does drink alcohol every day but only 1 glass of wine.  Closely monitor for any withdrawal.  Given that patient still has a persistent abdominal pain still feeling nauseated and vomiting patient agreed need inpatient status with continuous close monitoring with aggressive hydration.   DVT prophylaxis: SCDs for now until we make sure patient's pancreatitis and not getting complicated. Code Status: Full code. Family Communication: Discussed with patient. Disposition Plan: Home. Consults called: None. Admission status: Inpatient.   Rise Patience MD [facility] Pager [phone].  If 7PM-7AM, please contact night-coverage [email or web address] Password [facility]  07/21/2018, 3:43 AM

## 2018-07-21 NOTE — Progress Notes (Signed)
Patient was admitted with acute pancreatitis earlier today.  As per H&P " Stephen Evans is a 72 y.o. male with history of testicular cancer status post orchiectomy and radiation and history of melanoma status post surgery and lymph node dissection of the right axilla started having severe abdominal pain on way back from [facility] to home yesterday around noon time.  Pain was in the epigastric area radiating to the back with multiple episodes of vomiting.  Denies any diarrhea fever chills.  Denies any chest pain or shortness of breath.  Patient states he drinks wine every day".

## 2018-07-22 ENCOUNTER — Telehealth: Payer: Self-pay

## 2018-07-22 DIAGNOSIS — Z85820 Personal history of malignant melanoma of skin: Secondary | ICD-10-CM

## 2018-07-22 DIAGNOSIS — Z8547 Personal history of malignant neoplasm of testis: Secondary | ICD-10-CM

## 2018-07-22 LAB — CBC WITH DIFFERENTIAL/PLATELET
Abs Immature Granulocytes: 0.11 10*3/uL — ABNORMAL HIGH (ref 0.00–0.07)
Basophils Absolute: 0 10*3/uL (ref 0.0–0.1)
Basophils Relative: 0 %
Eosinophils Absolute: 0.1 10*3/uL (ref 0.0–0.5)
Eosinophils Relative: 1 %
HCT: 40.8 % (ref 39.0–52.0)
Hemoglobin: 13.2 g/dL (ref 13.0–17.0)
Immature Granulocytes: 1 %
Lymphocytes Relative: 4 %
Lymphs Abs: 0.6 10*3/uL — ABNORMAL LOW (ref 0.7–4.0)
MCH: 30.5 pg (ref 26.0–34.0)
MCHC: 32.4 g/dL (ref 30.0–36.0)
MCV: 94.2 fL (ref 80.0–100.0)
Monocytes Absolute: 1 10*3/uL (ref 0.1–1.0)
Monocytes Relative: 6 %
Neutro Abs: 14.7 10*3/uL — ABNORMAL HIGH (ref 1.7–7.7)
Neutrophils Relative %: 88 %
Platelets: 180 10*3/uL (ref 150–400)
RBC: 4.33 MIL/uL (ref 4.22–5.81)
RDW: 13.1 % (ref 11.5–15.5)
WBC: 16.5 10*3/uL — ABNORMAL HIGH (ref 4.0–10.5)
nRBC: 0 % (ref 0.0–0.2)

## 2018-07-22 LAB — HIV ANTIBODY (ROUTINE TESTING W REFLEX): HIV Screen 4th Generation wRfx: NONREACTIVE

## 2018-07-22 LAB — COMPREHENSIVE METABOLIC PANEL
ALT: 36 U/L (ref 0–44)
AST: 16 U/L (ref 15–41)
Albumin: 3.5 g/dL (ref 3.5–5.0)
Alkaline Phosphatase: 52 U/L (ref 38–126)
Anion gap: 6 (ref 5–15)
BUN: 16 mg/dL (ref 8–23)
CO2: 26 mmol/L (ref 22–32)
Calcium: 8.5 mg/dL — ABNORMAL LOW (ref 8.9–10.3)
Chloride: 109 mmol/L (ref 98–111)
Creatinine, Ser: 0.87 mg/dL (ref 0.61–1.24)
GFR calc Af Amer: >60 mL/min (ref >60)
GFR calc non Af Amer: >60 mL/min (ref >60)
Glucose, Bld: 108 mg/dL — ABNORMAL HIGH (ref 70–99)
Potassium: 3.6 mmol/L (ref 3.5–5.1)
Sodium: 141 mmol/L (ref 135–145)
Total Bilirubin: 1 mg/dL (ref 0.3–1.2)
Total Protein: 6 g/dL — ABNORMAL LOW (ref 6.5–8.1)

## 2018-07-22 LAB — GLUCOSE, CAPILLARY
GLUCOSE-CAPILLARY: 93 mg/dL (ref 70–99)
Glucose-Capillary: 97 mg/dL (ref 70–99)
Glucose-Capillary: 121 mg/dL — ABNORMAL HIGH (ref 70–99)
Glucose-Capillary: 65 mg/dL — ABNORMAL LOW (ref 70–99)

## 2018-07-22 LAB — LIPASE, BLOOD: Lipase: 313 U/L — ABNORMAL HIGH (ref 11–51)

## 2018-07-22 MED ORDER — SODIUM CHLORIDE 0.9 % IV SOLN: 1.0000 g | Freq: Three times a day (TID) | INTRAVENOUS | Status: DC

## 2018-07-22 MED ORDER — DEXTROSE 50 % IV SOLN
12.5000 g | INTRAVENOUS | Status: AC
  Administered 2018-07-22: 12.5 g via INTRAVENOUS
  Filled 2018-07-22: qty 50

## 2018-07-22 NOTE — Telephone Encounter (Addendum)
Author phoned Coralyn Mark to speak about spouse per pt's request. Pt. currently at [facility] for acute pancreatitis. Wife stated that pt. Is not having a good experience with gastroenterologist involved in his care. [The provider]  talks over him, and just isn't listening, and [[place]} is just in a lot of pain". Terry requesting Dr. Henrene Pastor, gastroenterologist near [facility] to consult or at least visit. Author stated she would let PCP and Dr. Henrene Pastor know.

## 2018-07-22 NOTE — Telephone Encounter (Signed)
Copied from [place] [id number]. Topic: General - Other >> Jul 22, 2018  9:55 AM Yvette Rack wrote: Reason for CRM: pt wife Coralyn Mark is calling to speak with [employer] assistant about husband that's in the hospital

## 2018-07-22 NOTE — Progress Notes (Signed)
Progress Note    Stephen Evans  UVO:[id number] DOB: 04-Apr-1946  DOA: 07/20/2018 PCP: [name], NP    Brief Narrative:   Chief complaint: Follow-up pancreatitis  Medical records reviewed and are as summarized below:  Stephen Evans is an 72 y.o. male with a PMH of testicular cancer status post orchiectomy and radiation therapy, melanoma status post surgical resection and lymph node dissection of the right axilla, heavy alcohol use who was admitted 07/21/2018 for evaluation of epigastric pain radiating to the back associated with vomiting.  In the ED, lipase noted to be 3585 and a CT of the abdomen/pelvis showed acute uncomplicated pancreatitis and an incidental hemangioma of the liver.  Patient given an IV fluid bolus and placed on bowel rest.  Care was picked up by Dr. Marthenia Rolling 07/21/2018 where he continued to be in severe pain.  Patient and family members requested a change in attending MD today, and I examined the patient with Dr. Marthenia Rolling present.  Apparently there is some concern about a breakdown in the/physician relationship that likely cannot be repaired.  At this time, I will honor the family's wishes and take over care.  Assessment/Plan:   Principal Problem:   Acute pancreatitis without infection or necrosis Continue bowel rest.  Lipase improved from [phone] with bowel rest and IV fluids.  Continues to require frequent doses of IV pain medications.  Family expressed concern about his leukocytosis and were reassured that this does not represent infection but rather inflammation.  WBC 16.5 today.  No evidence of pseudocyst formation.  No stones visualized on CT, and no gallstones or wall thickening on ultrasound.  No dilated bile duct.  There is minimal perihepatic ascites.  Suspect that the patient's acute pancreatitis has been triggered by heavy alcohol use.  Continue Dilaudid and Phenergan as needed.  Active Problems:   Heavy alcohol use Suspect there is some minimization.   Monitor closely for signs of alcohol withdrawal.  Irritability with treating MD may have been triggered by defensiveness about being questioned about his alcohol use versus irritability due to withdrawal.    Personal history of malignant melanoma Stable.    History of testicular cancer Stable.    Sinus bradycardia Not on rate controlling medications.  Continue telemetry monitoring.    Obesity Body mass index is 33.55 kg/m.   Family Communication/Anticipated D/C date and plan/Code Status   DVT prophylaxis: SCDs ordered. Code Status: Full Code.  Family Communication: Wife and son/daughter-in-law at bedside. Disposition Plan: Home when pain improved and diet successfully advanced.   Medical Consultants:    None.   Anti-Infectives:    None  Subjective:   Patient and family express concerns about Stephen Evans condition.  It appears part of the dissatisfaction with the previous MD stemmed from being concerned that Stephen Evans was not put on IV antibiotics in light of his elevated white blood cell count.  Reassured that this is the appropriate way to treat his condition and that inflammation is the likely reason he has a rising WBC.  Because of the damage to trust and the patient/physician relationship, I have assumed his care.  I carefully explained his condition to him and answered all of his questions.  He continues to have severe abdominal pain requiring frequent doses of IV Dilaudid and Phenergan for nausea.  No frank vomiting.  Daughter-in-law surreptitiously told me that he is a heavy drinker but wife reports that he drinks 1 glass of wine per night.  Objective:  Vitals:   07/21/18 0448 07/21/18 1517 07/21/18 2157 07/22/18 0502  BP: 137/70 (!) 151/65 (!) 124/57 129/60  Pulse: (!) 48 (!) 50 64 61  Resp: 18  14 18   Temp: 98.4 F (36.9 C) 98.6 F (37 C) 98 F (36.7 C) 97.9 F (36.6 C)  TempSrc: Oral Oral Oral Oral  SpO2: 98% 97% 94% 95%  Weight:    100.1 kg  Height:         Intake/Output Summary (Last 24 hours) at 07/22/2018 1412 Last data filed at 07/22/2018 1000 Gross per 24 hour  Intake 1634.15 ml  Output 300 ml  Net 1334.15 ml   Filed Weights   07/20/18 1554 07/22/18 0502  Weight: 99.8 kg 100.1 kg    Exam: General: No acute distress.  Irritable. Cardiovascular: Heart sounds show a regular rate, and rhythm. No gallops or rubs. No murmurs. No JVD. Lungs: Clear to auscultation bilaterally with good air movement. No rales, rhonchi or wheezes. Abdomen: Soft, diffusely tender with minimal distention.  Normal active bowel sounds. No masses. No hepatosplenomegaly. Neurological: Alert and oriented 3. Moves all extremities 4 with equal strength. Cranial nerves II through XII grossly intact. Skin: Warm and dry. No rashes or lesions. Extremities: No clubbing or cyanosis. No edema. Pedal pulses 2+. Psychiatric: Mood and affect are irritable. Insight and judgment are impaired.   Data Reviewed:   I have personally reviewed following labs and imaging studies:  Labs: Labs show the following:   Basic Metabolic Panel: Recent Labs  Lab 07/20/18 1740 07/21/18 0547 07/22/18 0538  NA 142 140 141  K 3.3* 4.0 3.6  CL 110 107 109  CO2 22 24 26   GLUCOSE 108* 126* 108*  BUN 18 16 16   CREATININE 0.83 0.97 0.87  CALCIUM 8.9 8.4* 8.5*  MG  --  1.8  --    GFR Estimated Creatinine Clearance: 90.6 mL/min (by C-G formula based on SCr of 0.87 mg/dL). Liver Function Tests: Recent Labs  Lab 07/20/18 1740 07/21/18 0547 07/22/18 0538  AST 92* 37 16  ALT 70* 60* 36  ALKPHOS 67 60 52  BILITOT 1.1 0.7 1.0  PROT 6.3* 6.1* 6.0*  ALBUMIN 3.8 3.6 3.5   Recent Labs  Lab 07/20/18 1740 07/21/18 0547 07/22/18 0538  LIPASE 3,585* 896* 313*   CBC: Recent Labs  Lab 07/20/18 1740 07/21/18 0547 07/22/18 0538  WBC 11.1* 12.7* 16.5*  NEUTROABS 9.7* 11.3* 14.7*  HGB 14.2 13.7 13.2  HCT 44.7 42.3 40.8  MCV 92.2 94.8 94.2  PLT 198 188 180   Cardiac  Enzymes: Recent Labs  Lab 07/20/18 1740 07/21/18 0547  TROPONINI <0.03 <0.03   BNP (last 3 results) No results for input(s): PROBNP in the last 8760 hours. CBG: Recent Labs  Lab 07/21/18 0815 07/21/18 1207 07/22/18 0829 07/22/18 1219  GLUCAP 126* 92 97 93   Lipid Profile: Recent Labs    07/21/18 0547  TRIG 28   Thyroid function studies: Recent Labs    07/21/18 0547  TSH 0.717   Sepsis Labs: Recent Labs  Lab 07/20/18 1740 07/21/18 0547 07/22/18 0538  WBC 11.1* 12.7* 16.5*  LATICACIDVEN  --  1.9  --     Microbiology No results found for this or any previous visit (from the past 240 hour(s)).  Procedures and diagnostic studies:  Ct Abdomen Pelvis W Contrast  Result Date: 07/20/2018 CLINICAL DATA:  Dyspnea, epigastric pain and diaphoresis. EXAM: CT ABDOMEN AND PELVIS WITH CONTRAST TECHNIQUE: Multidetector CT imaging of the abdomen  and pelvis was performed using the standard protocol following bolus administration of intravenous contrast. CONTRAST:  110mL ISOVUE-300 IOPAMIDOL (ISOVUE-300) INJECTION 61% COMPARISON:  None. FINDINGS: Lower chest: Dependent bibasilar atelectasis. Top-normal size heart. Hepatobiliary: Tiny cyst or hemangioma in the right hepatic lobe near the dome measuring 7 mm. This too small to further characterize. Pancreas: Peripancreatic inflammation without abscess. No pancreatic gland necrosis or pseudocyst formation. No ductal dilatation. No stones. Spleen: Normal Adrenals/Urinary Tract: Normal Stomach/Bowel: Stomach is within normal limits. Appendix appears normal. No evidence of bowel wall thickening, distention, or inflammatory changes. Vascular/Lymphatic: Mild moderate aortoiliac atherosclerosis. No aneurysm or dissection. No lymphadenopathy. Reproductive: Normal size prostate and seminal vesicles. Other: No free air nor free fluid. Tiny periumbilical fat containing hernia. Musculoskeletal: Mild degenerative change along the lower thoracic spine. No  acute osseous appearing abnormality. IMPRESSION: 1. Acute uncomplicated pancreatitis. 2. 7 mm cyst or hemangioma in the right hepatic lobe. 3. Aortoiliac atherosclerosis without aneurysm or dissection. Electronically Signed   By: Ashley Royalty M.D.   On: 07/20/2018 19:49   US Abdomen Limited Ruq  Result Date: 07/21/2018 CLINICAL DATA:  Acute pancreatitis. EXAM: ULTRASOUND ABDOMEN LIMITED RIGHT UPPER QUADRANT COMPARISON:  None. FINDINGS: Gallbladder: No gallstones or wall thickening visualized. No sonographic Murphy sign noted by sonographer. Common bile duct: Diameter: 3 mm, within normal limits. Liver: No focal lesion identified. Within normal limits in parenchymal echogenicity. Portal vein is patent on color Doppler imaging with normal direction of blood flow towards the liver. Minimal perihepatic ascites noted. IMPRESSION: Minimal perihepatic ascites. No evidence of gallstones or other hepatobiliary abnormality. Electronically Signed   By: Earle Gell M.D.   On: 07/21/2018 17:36    Medications:    Continuous Infusions: . dextrose 5 % and 0.9% NaCl 75 mL/hr at 07/22/18 0246     LOS: 1 day   35 minutes spent on the care of this patient with greater than 50% of the total time in face-to-face interaction with the patient and his family providing in-depth explanation of his current condition, diagnostic test results, and plan of care.  Margreta Journey   [facility] Pager [phone]. If unable to reach me by pager, please call my cell phone at [phone].  *Please refer to [email or web address], password [email or web address] to get updated schedule on who will round on this patient, as hospitalists switch teams weekly. If 7PM-7AM, please contact night-coverage at [email or web address], password [email or web address] for any overnight needs.  07/22/2018, 2:12 PM

## 2018-07-22 NOTE — Telephone Encounter (Signed)
Our office has very little control over hospital coverage. TY.

## 2018-07-22 NOTE — Progress Notes (Signed)
Hypoglycemic Event  CBG: 65  Treatment: D50 25 mL (12.5 gm)  Symptoms: None  Follow-up CBG: Time:121 CBG Result: 2229  Possible Reasons for Event: Inadequate meal intake  Comments/MD notified: Initiated hypoglycemic protocol     Hibba Schram Y

## 2018-07-23 DIAGNOSIS — E162 Hypoglycemia, unspecified: Secondary | ICD-10-CM | POA: Diagnosis present

## 2018-07-23 LAB — GLUCOSE, CAPILLARY
Glucose-Capillary: 107 mg/dL — ABNORMAL HIGH (ref 70–99)
Glucose-Capillary: 91 mg/dL (ref 70–99)
Glucose-Capillary: 97 mg/dL (ref 70–99)
Glucose-Capillary: 99 mg/dL (ref 70–99)

## 2018-07-23 NOTE — Progress Notes (Addendum)
Progress Note    Stephen Evans  [id number]:[id number] DOB: [birth year]  DOA: 07/20/2018 PCP: [name], NP    Brief Narrative:   Chief complaint: Follow-up pancreatitis  Medical records reviewed and are as summarized below:  Stephen Evans is an 72 y.o. male with a PMH of testicular cancer status post orchiectomy and radiation therapy, melanoma status post surgical resection and lymph node dissection of the right axilla, heavy alcohol use who was admitted 07/21/2018 for evaluation of epigastric pain radiating to the back associated with vomiting.  In the ED, lipase noted to be 3585 and a CT of the abdomen/pelvis showed acute uncomplicated pancreatitis and an incidental hemangioma of the liver.  Patient given an IV fluid bolus and placed on bowel rest.  Care was picked up by Dr. Marthenia Rolling 07/21/2018 where he continued to be in severe pain.  Patient and family members requested a change in attending MD 07/22/18, and I examined the patient with Dr. Marthenia Rolling present.  Apparently there was some concern about a breakdown in the/physician relationship that likely cannot be repaired.  I subsequently took over his care.  Assessment/Plan:   Principal Problem:   Acute pancreatitis without infection or necrosis Lipase improved from [phone] with bowel rest and IV fluids. Family expressed concern about his leukocytosis and were reassured that this does not represent infection but rather inflammation.  WBC 16.5 07/22/18.  CT scan/RUQ [place] reviewed. No evidence of pseudocyst formation.  No stones visualized on CT, and no gallstones or wall thickening on ultrasound.  No dilated bile duct.  There is minimal perihepatic ascites.  Suspect that the patient's acute pancreatitis has been triggered by heavy alcohol use, and he has been counseled.  Continue Dilaudid and Phenergan as needed.  Used 7 doses of IV Dilaudid yesterday and 2 so far today.  Continue bowel rest and IV fluids. Re-check CBC in am.  Active  Problems:   Hypoglycemia Continue IV fluids containing dextrose, increase rate to 100 cc/hr. Will increase frequency of CBG checks to Q 6 hours.    Heavy alcohol use Suspect there is some minimization.  Monitor closely for signs of alcohol withdrawal. No signs of active withdrawal noted. Understands how his alcohol use may have contributed to his current illness.    Personal history of malignant melanoma Stable.    History of testicular cancer Stable.    Sinus bradycardia Stable, HR now WNL. D/C tele.    Obesity Body mass index is 34.58 kg/m.   Family Communication/Anticipated D/C date and plan/Code Status   DVT prophylaxis: SCDs ordered. Code Status: Full Code.  Family Communication: Son updated at bedside. Disposition Plan: Home when pain improved and diet successfully advanced.   Medical Consultants:    None.   Anti-Infectives:    None  Subjective:   Continues to have pain as bad as 8/10 with occasional nausea. No BM but feels he may need to move his bowels today. No SOB. No evidence of withdrawal.  Objective:    Vitals:   07/22/18 1437 07/22/18 2029 07/23/18 0446 07/23/18 0457  BP: (!) 183/84 (!) 167/65 (!) 153/83   Pulse: 64 69 73   Resp: 20 18 18    Temp: 98.7 F (37.1 C) 98 F (36.7 C) 97.9 F (36.6 C)   TempSrc: Oral Oral Oral   SpO2: 96% 98% 95%   Weight:    103.1 kg  Height:        Intake/Output Summary (Last 24 hours) at 07/23/2018 1345  Last data filed at 07/23/2018 0500 Gross per 24 hour  Intake 1664.91 ml  Output 525 ml  Net 1139.91 ml   Filed Weights   07/20/18 1554 07/22/18 0502 07/23/18 0457  Weight: 99.8 kg 100.1 kg 103.1 kg    Exam: General: No acute distress. Cardiovascular: Heart sounds show a regular rate, and rhythm. No gallops or rubs. No murmurs. No JVD. Lungs: Clear to auscultation bilaterally with good air movement. No rales, rhonchi or wheezes. Abdomen: Soft, less tender today.  Normal active bowel sounds. No  masses. No hepatosplenomegaly. Skin: Warm and dry. No rashes or lesions. Extremities: No clubbing or cyanosis. No edema. Pedal pulses 2+. Psychiatric: Mood and affect are normal. Insight and judgment are impaired.   Data Reviewed:   I have personally reviewed following labs and imaging studies:  Labs: Labs show the following:   Basic Metabolic Panel: Recent Labs  Lab 07/20/18 1740 07/21/18 0547 07/22/18 0538  NA 142 140 141  K 3.3* 4.0 3.6  CL 110 107 109  CO2 22 24 26   GLUCOSE 108* 126* 108*  BUN 18 16 16   CREATININE 0.83 0.97 0.87  CALCIUM 8.9 8.4* 8.5*  MG  --  1.8  --    GFR Estimated Creatinine Clearance: 92 mL/min (by C-G formula based on SCr of 0.87 mg/dL). Liver Function Tests: Recent Labs  Lab 07/20/18 1740 07/21/18 0547 07/22/18 0538  AST 92* 37 16  ALT 70* 60* 36  ALKPHOS 67 60 52  BILITOT 1.1 0.7 1.0  PROT 6.3* 6.1* 6.0*  ALBUMIN 3.8 3.6 3.5   Recent Labs  Lab 07/20/18 1740 07/21/18 0547 07/22/18 0538  LIPASE 3,585* 896* 313*   CBC: Recent Labs  Lab 07/20/18 1740 07/21/18 0547 07/22/18 0538  WBC 11.1* 12.7* 16.5*  NEUTROABS 9.7* 11.3* 14.7*  HGB 14.2 13.7 13.2  HCT 44.7 42.3 40.8  MCV 92.2 94.8 94.2  PLT 198 188 180   Cardiac Enzymes: Recent Labs  Lab 07/20/18 1740 07/21/18 0547  TROPONINI <0.03 <0.03   BNP (last 3 results) No results for input(s): PROBNP in the last 8760 hours. CBG: Recent Labs  Lab 07/22/18 1219 07/22/18 2146 07/22/18 2229 07/23/18 0741 07/23/18 1247  GLUCAP 93 65* 121* 99 91   Lipid Profile: Recent Labs    07/21/18 0547  TRIG 28   Thyroid function studies: Recent Labs    07/21/18 0547  TSH 0.717   Sepsis Labs: Recent Labs  Lab 07/20/18 1740 07/21/18 0547 07/22/18 0538  WBC 11.1* 12.7* 16.5*  LATICACIDVEN  --  1.9  --     Procedures and diagnostic studies:  US Abdomen Limited Ruq  Result Date: 07/21/2018 CLINICAL DATA:  Acute pancreatitis. EXAM: ULTRASOUND ABDOMEN LIMITED RIGHT  UPPER QUADRANT COMPARISON:  None. FINDINGS: Gallbladder: No gallstones or wall thickening visualized. No sonographic Murphy sign noted by sonographer. Common bile duct: Diameter: 3 mm, within normal limits. Liver: No focal lesion identified. Within normal limits in parenchymal echogenicity. Portal vein is patent on color Doppler imaging with normal direction of blood flow towards the liver. Minimal perihepatic ascites noted. IMPRESSION: Minimal perihepatic ascites. No evidence of gallstones or other hepatobiliary abnormality. Electronically Signed   By: Earle Gell M.D.   On: 07/21/2018 17:36    Medications:    Continuous Infusions: . dextrose 5 % and 0.9% NaCl 100 mL/hr at 07/23/18 1032     LOS: 2 days   35 minutes spent on the care of this patient with greater than 50% of the  total time in face-to-face interaction with the patient and his family providing in-depth explanation of his current condition, diagnostic test results, and plan of care. Reassurance that he is progressing as expected.  Margreta Journey Law Corsino  [facility] Pager [phone]. If unable to reach me by pager, please call my cell phone at [phone].  *Please refer to [email or web address], password [email or web address] to get updated schedule on who will round on this patient, as hospitalists switch teams weekly. If 7PM-7AM, please contact night-coverage at [email or web address], password [email or web address] for any overnight needs.  07/23/2018, 1:45 PM

## 2018-07-23 NOTE — Telephone Encounter (Signed)
Called and LMOM at their home. I am answering messages for Stephen Evans today.  We are sorry that Stephen Evans is sick and we hope he is better soon.   unfortunately we are not able to control which doctors will care for him at the hospital

## 2018-07-23 NOTE — Care Management Important Message (Signed)
Important Message  Patient Details  Name: Stephen Evans MRN: [id number] Date of Birth: February 10, 1946   Medicare Important Message Given:  Yes    Tristan Bramble 07/23/2018, 8:53 AM

## 2018-07-24 DIAGNOSIS — E162 Hypoglycemia, unspecified: Secondary | ICD-10-CM

## 2018-07-24 LAB — CBC
HCT: 37.7 % — ABNORMAL LOW (ref 39.0–52.0)
Hemoglobin: 11.9 g/dL — ABNORMAL LOW (ref 13.0–17.0)
MCH: 30.4 pg (ref 26.0–34.0)
MCHC: 31.6 g/dL (ref 30.0–36.0)
MCV: 96.2 fL (ref 80.0–100.0)
NRBC: 0 % (ref 0.0–0.2)
Platelets: 165 10*3/uL (ref 150–400)
RBC: 3.92 MIL/uL — ABNORMAL LOW (ref 4.22–5.81)
RDW: 12.7 % (ref 11.5–15.5)
WBC: 8.5 10*3/uL (ref 4.0–10.5)

## 2018-07-24 LAB — GLUCOSE, CAPILLARY
Glucose-Capillary: 97 mg/dL (ref 70–99)
Glucose-Capillary: 83 mg/dL (ref 70–99)
Glucose-Capillary: 85 mg/dL (ref 70–99)

## 2018-07-24 NOTE — Plan of Care (Signed)
Pt alert and oriented, resting with wife at the bedside.  Pt states his pain is getting better, but still has abd pain intermittently. RN will monitor.

## 2018-07-24 NOTE — Progress Notes (Signed)
  PROGRESS NOTE  Stephen Evans [id number]:[id number] DOB: Jul 30, 1945 DOA: 07/20/2018 PCP: [name], NP  Brief Narrative: 72 year old man PMH testicular cancer status post orchiectomy and radiation, melanoma status post surgery and lymph node dissection right axilla, presented with severe abdominal pain with vomiting in the context of daily wine intake.  Admitted for acute uncomplicated pancreatitis.  Assessment/Plan Acute uncomplicated pancreatitis with elevated LFTs status post aggressive IV fluids.  Thought to be alcohol related.  CT scan abdomen pelvis no evidence of pseudocyst formation or complicating features.  No cholelithiasis on ultrasound. --Slowly improving but not yet ready for oral intake, still requiring IV pain medication.  Continue to monitor today, possible trial of clear liquids tomorrow.  Daily alcohol use --Counseled on cessation and connection with pancreatitis per Dr. Rockne Menghini.  Hypoglycemia, resolved.  Secondary to no oral intake.  Sinus bradycardia --TSH within normal limits.  Asymptomatic.  DVT prophylaxis: SCDs Code Status: Full Family Communication: wife at bedside Disposition Plan: home    Murray Hodgkins, MD  [facility] Direct contact: [phone] --[place]ia [place]  --[email or web address]; password [email or web address]  7PM-7AM contact night coverage as above 07/24/2018, 4:15 PM  LOS: 3 days   Consultants:    Procedures:    Antimicrobials:    Interval history/Subjective: 3 doses of IV Dilaudid today, 7 doses yesterday.  Feels better, still has abdominal pain but less so, no vomiting today.  No bowel movement but passing gas.  Objective: Vitals:  Vitals:   07/24/18 0541 07/24/18 1406  BP: (!) 155/75 (!) 153/74  Pulse: (!) 53 (!) 54  Resp: 17 (!) 22  Temp: 98.5 F (36.9 C) 99.1 F (37.3 C)  SpO2: 100% 100%    Exam:  Constitutional:  . Appears calm, mildly uncomfortable Eyes:  . pupils and irises appear normal . Normal lids  ENMT:   . grossly normal hearing  . Lips appear normal Respiratory:  . CTA bilaterally, no w/r/r.  . Respiratory effort normal.  Cardiovascular:  . RRR, no m/r/g . No LE extremity edema   Abdomen:  . Soft distended, tympanic, positive bowel sounds, moderate epigastric pain. Psychiatric:  . Mental status o Mood, affect appropriate   I have personally reviewed the following:   Data: . CBG stable . WBC is normalized at 8.5 remainder CBC unremarkable. Marland Kitchen No chemistry panel or lipase today but review of yesterday showed dramatic improvement in lipase and normalization of LFTs. . Troponins negative . Triglycerides within normal limits.  TSH within normal limits. . EKG showed marked sinus bradycardia  Scheduled Meds: Continuous Infusions: . dextrose 5 % and 0.9% NaCl 100 mL/hr at 07/24/18 1520    Principal Problem:   Acute pancreatitis without infection or necrosis Active Problems:   Personal history of malignant melanoma   History of testicular cancer   Sinus bradycardia   LOS: 3 days

## 2018-07-25 LAB — GLUCOSE, CAPILLARY
GLUCOSE-CAPILLARY: 105 mg/dL — AB (ref 70–99)
Glucose-Capillary: 108 mg/dL — ABNORMAL HIGH (ref 70–99)
Glucose-Capillary: 98 mg/dL (ref 70–99)
Glucose-Capillary: 114 mg/dL — ABNORMAL HIGH (ref 70–99)

## 2018-07-25 LAB — COMPREHENSIVE METABOLIC PANEL
ALT: 24 U/L (ref 0–44)
AST: 17 U/L (ref 15–41)
Albumin: 2.7 g/dL — ABNORMAL LOW (ref 3.5–5.0)
Alkaline Phosphatase: 57 U/L (ref 38–126)
Anion gap: 8 (ref 5–15)
BUN: 11 mg/dL (ref 8–23)
CO2: 23 mmol/L (ref 22–32)
Calcium: 7.9 mg/dL — ABNORMAL LOW (ref 8.9–10.3)
Chloride: 109 mmol/L (ref 98–111)
Creatinine, Ser: 0.66 mg/dL (ref 0.61–1.24)
GFR calc Af Amer: >60 mL/min (ref >60)
GFR calc non Af Amer: >60 mL/min (ref >60)
Glucose, Bld: 109 mg/dL — ABNORMAL HIGH (ref 70–99)
POTASSIUM: 3.4 mmol/L — AB (ref 3.5–5.1)
Sodium: 140 mmol/L (ref 135–145)
Total Bilirubin: 0.9 mg/dL (ref 0.3–1.2)
Total Protein: 5.3 g/dL — ABNORMAL LOW (ref 6.5–8.1)

## 2018-07-25 LAB — LIPASE, BLOOD: Lipase: 62 U/L — ABNORMAL HIGH (ref 11–51)

## 2018-07-25 MED ORDER — HYDROCODONE-ACETAMINOPHEN 5-325 MG PO TABS
1.0000 | ORAL_TABLET | Freq: Four times a day (QID) | ORAL | Status: DC | PRN
  Administered 2018-07-25 (×2): 1 via ORAL
  Filled 2018-07-25 (×2): qty 1

## 2018-07-25 MED ORDER — POTASSIUM CHLORIDE CRYS ER 20 MEQ PO TBCR
40.0000 meq | EXTENDED_RELEASE_TABLET | Freq: Two times a day (BID) | ORAL | Status: AC
  Administered 2018-07-25 (×2): 40 meq via ORAL
  Filled 2018-07-25 (×2): qty 2

## 2018-07-25 NOTE — Telephone Encounter (Signed)
Dr. Havery Moros patient. I will forward to him. In patient [place] hospital GI team could see if appropriate

## 2018-07-25 NOTE — Progress Notes (Signed)
  PROGRESS NOTE  AVERI CACIOPPO [id number]:[id number] DOB: 06-13-1946 DOA: 07/20/2018 PCP: [name], NP  Brief Narrative: 72 year old man PMH testicular cancer status post orchiectomy and radiation, melanoma status post surgery and lymph node dissection right axilla, presented with severe abdominal pain with vomiting in the context of daily wine intake.  Admitted for acute uncomplicated pancreatitis.  Assessment/Plan Acute uncomplicated pancreatitis with elevated LFTs status post aggressive IV fluids.  Thought to be alcohol related.  CT scan abdomen pelvis no evidence of pseudocyst formation or complicating features.  No cholelithiasis on ultrasound. --Improved today, lipase near normal, no vomiting, decreased pain. --Advance to clear liquids.  Add Lortab for moderate pain.    Daily alcohol use --Was counseled on cessation and connection with pancreatitis per Dr. Rockne Menghini.  Hypoglycemia, resolved.  Secondary to no oral intake.  Sinus bradycardia --TSH within normal limits.  Asymptomatic.  Starting to improve.  Likely home next 48 hours if tolerates diet.  DVT prophylaxis: SCDs Code Status: Full Family Communication: wife at bedside again 12/29 Disposition Plan: home    Murray Hodgkins, MD  [facility] Direct contact: [phone] --[address] [place]  --[email or web address]; password [email or web address]  7PM-7AM contact night coverage as above 07/25/2018, 12:17 PM  LOS: 4 days   Consultants:    Procedures:    Antimicrobials:    Interval history/Subjective: Follow-up acute pancreatitis  Feeling much better, had a bowel movement this morning.  No nausea or vomiting.  Decreased pain.  Beginning to be hungry.  Objective: Vitals:  Vitals:   07/24/18 2124 07/25/18 0608  BP: (!) 151/68 (!) 179/85  Pulse: 61 (!) 57  Resp: 17 17  Temp: 98.7 F (37.1 C) 98.5 F (36.9 C)  SpO2: 96% 97%    Exam:  Constitutional:   . Appears calm and comfortable sitting on the side of the  bed Respiratory:  . CTA bilaterally, no w/r/r.  . Respiratory effort normal.  Cardiovascular:  . RRR, no m/r/g . No LE extremity edema   Abdomen:  . Soft, nondistended, nontender Psychiatric:  . Mental status o Mood, affect appropriate  I have personally reviewed the following:   Data: . CBG stable, no hypoglycemia . Potassium 3.4, remainder CMP unremarkable, lipase 62.  Scheduled Meds: . potassium chloride  40 mEq Oral BID   Continuous Infusions: . dextrose 5 % and 0.9% NaCl 50 mL/hr at 07/25/18 5400    Principal Problem:   Acute pancreatitis without infection or necrosis Active Problems:   Personal history of malignant melanoma   History of testicular cancer   Sinus bradycardia   LOS: 4 days

## 2018-07-25 NOTE — Plan of Care (Signed)
Pt alert and oriented, doing well this am.  Diet advanced to clear liquids. PO pain meds ordered as well.  Tolerating diet, RN will monitor.

## 2018-07-26 LAB — GLUCOSE, CAPILLARY
Glucose-Capillary: 101 mg/dL — ABNORMAL HIGH (ref 70–99)
Glucose-Capillary: 103 mg/dL — ABNORMAL HIGH (ref 70–99)

## 2018-07-26 MED ORDER — ACETAMINOPHEN 325 MG PO TABS: 650.0000 mg | ORAL_TABLET | Freq: Four times a day (QID) | ORAL | Status: DC | PRN

## 2018-07-26 NOTE — Discharge Summary (Signed)
Physician Discharge Summary  Stephen Evans OMV:[id number] DOB: [birth year] DOA: 07/20/2018  PCP: [name], NP  Admit date: 07/20/2018 Discharge date: 07/26/2018  Recommendations for Outpatient Follow-up:  1. Routine care  Follow-up Information    [name], NP Follow up.   Specialty:  Internal Medicine Why:  as needed Contact information: [address] [place] [address] [phone]           Discharge Diagnoses: Principal diagnosis is #1 1. Acute uncomplicated pancreatitis with elevated LFTs status post aggressive IV fluids 2. Daily alcohol use 3. Hypoglycemia 4. Sinus bradycardia  Discharge Condition: improved Disposition: home  Diet recommendation: bland, low fat diet  Filed Weights   07/20/18 1554 07/22/18 0502 07/23/18 0457  Weight: 99.8 kg 100.1 kg 103.1 kg    History of present illness:  72 year old man PMH testicular cancer status post orchiectomy and radiation, melanoma status post surgery and lymph node dissection right axilla, presented with severe abdominal pain with vomiting in the context of daily wine intake.  Admitted for acute uncomplicated pancreatitis.  Hospital Course:  Patient was treated in standard fashion for acute pancreatitis with gradual clinical improvement and resolution of abdominal pain.  He is tolerating a soft diet and stable for discharge.  Hospitalization was uncomplicated.  Individual issues as below.  Acute uncomplicated pancreatitis with elevated LFTs status post aggressive IV fluids.  Thought to be alcohol related.  CT scan abdomen pelvis no evidence of pseudocyst formation or complicating features.  No cholelithiasis on ultrasound. --Home on bland diet.  Counseled on alcohol use.  Daily alcohol use --Was counseled on cessation and connection with pancreatitis per Dr. Rockne Menghini.  Hypoglycemia, resolved.  Secondary to no oral intake.  Sinus bradycardia --TSH within normal limits.   Asymptomatic.  Today's assessment: S: Feels very well, tolerated liquids without difficulty no abdominal pain, ambulating without difficulty.  Subsequently tolerated lunch soft diet without pain. O: Vitals:  Vitals:   07/25/18 2032 07/26/18 0525  BP: (!) 169/85 (!) 163/77  Pulse: (!) 54 (!) 51  Resp: 18 18  Temp: 98 F (36.7 C) 98.2 F (36.8 C)  SpO2: 99% 96%    Constitutional:  . Appears calm and comfortable.  Ambulating in the hallway without difficulty. Respiratory:  . CTA bilaterally, no w/r/r.  . Respiratory effort normal.  Cardiovascular:  . RRR, no m/r/g . No LE extremity edema   Abdomen:  . Soft, nontender Psychiatric:  . Mental status o Mood, affect appropriate   Discharge Instructions  Discharge Instructions    Activity as tolerated - No restrictions   Complete by:  As directed    Discharge instructions   Complete by:  As directed    Call your physician or seek immediate medical attention for fever, pain, vomiting or worsening of condition. Continue a bland diet, very low fat for one week, then slowly incorporate fats into diet again.     Allergies as of 07/26/2018   No Known Allergies     Medication List    STOP taking these medications   omeprazole 40 MG capsule Commonly known as:  PRILOSEC     TAKE these medications   acetaminophen 325 MG tablet Commonly known as:  TYLENOL Take 2 tablets (650 mg total) by mouth every 6 (six) hours as needed for mild pain (or Fever >/= 101).   sildenafil 20 MG tablet Commonly known as:  REVATIO 1-2 tabs by mouth 30 minutes prior to sexual activity   Testosterone 100 MG  Pllt 1 application to arm daily.      No Known Allergies  The results of significant diagnostics from this hospitalization (including imaging, microbiology, ancillary and laboratory) are listed below for reference.    Significant Diagnostic Studies: Ct Abdomen Pelvis W Contrast  Result Date: 07/20/2018 CLINICAL DATA:  Dyspnea,  epigastric pain and diaphoresis. EXAM: CT ABDOMEN AND PELVIS WITH CONTRAST TECHNIQUE: Multidetector CT imaging of the abdomen and pelvis was performed using the standard protocol following bolus administration of intravenous contrast. CONTRAST:  138mL ISOVUE-300 IOPAMIDOL (ISOVUE-300) INJECTION 61% COMPARISON:  None. FINDINGS: Lower chest: Dependent bibasilar atelectasis. Top-normal size heart. Hepatobiliary: Tiny cyst or hemangioma in the right hepatic lobe near the dome measuring 7 mm. This too small to further characterize. Pancreas: Peripancreatic inflammation without abscess. No pancreatic gland necrosis or pseudocyst formation. No ductal dilatation. No stones. Spleen: Normal Adrenals/Urinary Tract: Normal Stomach/Bowel: Stomach is within normal limits. Appendix appears normal. No evidence of bowel wall thickening, distention, or inflammatory changes. Vascular/Lymphatic: Mild moderate aortoiliac atherosclerosis. No aneurysm or dissection. No lymphadenopathy. Reproductive: Normal size prostate and seminal vesicles. Other: No free air nor free fluid. Tiny periumbilical fat containing hernia. Musculoskeletal: Mild degenerative change along the lower thoracic spine. No acute osseous appearing abnormality. IMPRESSION: 1. Acute uncomplicated pancreatitis. 2. 7 mm cyst or hemangioma in the right hepatic lobe. 3. Aortoiliac atherosclerosis without aneurysm or dissection. Electronically Signed   By: Ashley Royalty M.D.   On: 07/20/2018 19:49   US Abdomen Limited Ruq  Result Date: 07/21/2018 CLINICAL DATA:  Acute pancreatitis. EXAM: ULTRASOUND ABDOMEN LIMITED RIGHT UPPER QUADRANT COMPARISON:  None. FINDINGS: Gallbladder: No gallstones or wall thickening visualized. No sonographic Murphy sign noted by sonographer. Common bile duct: Diameter: 3 mm, within normal limits. Liver: No focal lesion identified. Within normal limits in parenchymal echogenicity. Portal vein is patent on color Doppler imaging with normal direction  of blood flow towards the liver. Minimal perihepatic ascites noted. IMPRESSION: Minimal perihepatic ascites. No evidence of gallstones or other hepatobiliary abnormality. Electronically Signed   By: Earle Gell M.D.   On: 07/21/2018 17:36   Labs: Basic Metabolic Panel: Recent Labs  Lab 07/20/18 1740 07/21/18 0547 07/22/18 0538 07/25/18 0604  NA 142 140 141 140  K 3.3* 4.0 3.6 3.4*  CL 110 107 109 109  CO2 22 24 26 23   GLUCOSE 108* 126* 108* 109*  BUN 18 16 16 11   CREATININE 0.83 0.97 0.87 0.66  CALCIUM 8.9 8.4* 8.5* 7.9*  MG  --  1.8  --   --    Liver Function Tests: Recent Labs  Lab 07/20/18 1740 07/21/18 0547 07/22/18 0538 07/25/18 0604  AST 92* 37 16 17  ALT 70* 60* 36 24  ALKPHOS 67 60 52 57  BILITOT 1.1 0.7 1.0 0.9  PROT 6.3* 6.1* 6.0* 5.3*  ALBUMIN 3.8 3.6 3.5 2.7*   Recent Labs  Lab 07/20/18 1740 07/21/18 0547 07/22/18 0538 07/25/18 0604  LIPASE 3,585* 896* 313* 62*   CBC: Recent Labs  Lab 07/20/18 1740 07/21/18 0547 07/22/18 0538 07/24/18 0504  WBC 11.1* 12.7* 16.5* 8.5  NEUTROABS 9.7* 11.3* 14.7*  --   HGB 14.2 13.7 13.2 11.9*  HCT 44.7 42.3 40.8 37.7*  MCV 92.2 94.8 94.2 96.2  PLT 198 188 180 165   Cardiac Enzymes: Recent Labs  Lab 07/20/18 1740 07/21/18 0547  TROPONINI <0.03 <0.03   CBG: Recent Labs  Lab 07/25/18 0602 07/25/18 1156 07/25/18 1818 07/26/18 0657 07/26/18 1156  GLUCAP 98 105* 114*  101* 103*    Principal Problem:   Acute pancreatitis without infection or necrosis Active Problems:   Personal history of malignant melanoma   History of testicular cancer   Sinus bradycardia   Time coordinating discharge: 35 minutes  Signed:  Murray Hodgkins, MD [facility] 07/26/2018, 4:19 PM

## 2018-07-26 NOTE — Telephone Encounter (Signed)
Thanks for the note, I will touch base with the inpatient GI service about his case.

## 2018-07-26 NOTE — Care Management Important Message (Signed)
Important Message  Patient Details  Name: QUENCY TOBER MRN: [id number] Date of Birth: 26-Oct-1945   Medicare Important Message Given:  Yes    Kerin Salen 07/26/2018, 11:18 AMImportant Message  Patient Details  Name: SUNG PARODI MRN: [id number] Date of Birth: 11-01-1945   Medicare Important Message Given:  Yes    Kerin Salen 07/26/2018, 11:18 AM

## 2018-07-27 ENCOUNTER — Encounter: Payer: Self-pay | Admitting: Family

## 2018-07-30 ENCOUNTER — Encounter: Payer: Self-pay | Admitting: Family

## 2018-07-30 ENCOUNTER — Ambulatory Visit (INDEPENDENT_AMBULATORY_CARE_PROVIDER_SITE_OTHER): Payer: Medicare HMO | Admitting: Family

## 2018-07-30 VITALS — BP 133/64 | HR 62 | Temp 97.5°F | Resp 16 | Ht 67.0 in | Wt 214.0 lb

## 2018-07-30 DIAGNOSIS — K859 Acute pancreatitis without necrosis or infection, unspecified: Secondary | ICD-10-CM | POA: Diagnosis not present

## 2018-07-30 LAB — COMPREHENSIVE METABOLIC PANEL
ALT: 49 U/L (ref 0–53)
AST: 21 U/L (ref 0–37)
Albumin: 4 g/dL (ref 3.5–5.2)
Alkaline Phosphatase: 92 U/L (ref 39–117)
BUN: 10 mg/dL (ref 6–23)
CO2: 26 mEq/L (ref 19–32)
Calcium: 9.3 mg/dL (ref 8.4–10.5)
Chloride: 104 mEq/L (ref 96–112)
Creatinine, Ser: 0.77 mg/dL (ref 0.40–1.50)
GFR: 106.08 mL/min (ref >60.00)
Glucose, Bld: 100 mg/dL — ABNORMAL HIGH (ref 70–99)
Potassium: 4.2 mEq/L (ref 3.5–5.1)
Sodium: 140 mEq/L (ref 135–145)
Total Bilirubin: 0.6 mg/dL (ref 0.2–1.2)
Total Protein: 6.6 g/dL (ref 6.0–8.3)

## 2018-07-30 LAB — CBC WITH DIFFERENTIAL/PLATELET
BASOS PCT: 0.7 % (ref 0.0–3.0)
Basophils Absolute: 0 10*3/uL (ref 0.0–0.1)
Eosinophils Absolute: 0.2 10*3/uL (ref 0.0–0.7)
Eosinophils Relative: 2.8 % (ref 0.0–5.0)
HCT: 42.3 % (ref 39.0–52.0)
Hemoglobin: 14.4 g/dL (ref 13.0–17.0)
Lymphocytes Relative: 21.4 % (ref 12.0–46.0)
Lymphs Abs: 1.5 10*3/uL (ref 0.7–4.0)
MCHC: 34.1 g/dL (ref 30.0–36.0)
MCV: 89 fl (ref 78.0–100.0)
Monocytes Absolute: 0.6 10*3/uL (ref 0.1–1.0)
Monocytes Relative: 7.9 % (ref 3.0–12.0)
Neutro Abs: 4.7 10*3/uL (ref 1.4–7.7)
Neutrophils Relative %: 67.2 % (ref 43.0–77.0)
Platelets: 330 10*3/uL (ref 150.0–400.0)
RBC: 4.75 Mil/uL (ref 4.22–5.81)
RDW: 12.8 % (ref 11.5–15.5)
WBC: 7 10*3/uL (ref 4.0–10.5)

## 2018-07-30 LAB — AMYLASE: Amylase: 34 U/L (ref 27–131)

## 2018-07-30 LAB — LIPASE: Lipase: 36 U/L (ref 11.0–59.0)

## 2018-07-30 NOTE — Progress Notes (Signed)
Subjective:    Patient ID: Stephen Evans, male    DOB: Dec 06, 1945, 73 y.o.   MRN: [id number]  HPI  Patient is a 73 yr old male who presents today for hospital follow up. Discharge summary is reviewed. Patient was admitted 07/20/18 for acute pancreatitis and remained hospitalized until 07/26/18.  He was treated with aggressive IV fluids.  He had some asymptomatic bradycardia during his hospitalization with normal TSH.   He reports that since returning home he has been feeling well.  He is no longer having abdominal pain.  He is tolerating p.o.'s without difficulty.  He reports regular bowel movements but notices that they have been loose in nature. He continues to abstain from alcohol.     Review of Systems    see HPI  Past Medical History:  Diagnosis Date  . Melanoma ([place]) x 2  . Melanoma ([place])   . Melanoma ([place])   . Testicular cancer ([place]) 2011   testicular--radiation  . Testicular tumor      Social History   Socioeconomic History  . Marital status: Married    Spouse name: Not on file  . Number of children: 2  . Years of education: Not on file  . Highest education level: Not on file  Occupational History  . Occupation: retired    Comment: Arts development officer  Social Needs  . Financial resource strain: Not on file  . Food insecurity:    Worry: Not on file    Inability: Not on file  . Transportation needs:    Medical: Not on file    Non-medical: Not on file  Tobacco Use  . Smoking status: Former Smoker    Years: 30.00    Types: Cigarettes    Last attempt to quit: 09/06/1997    Years since quitting: 20.9  . Smokeless tobacco: Never Used  Substance and Sexual Activity  . Alcohol use: Yes    Alcohol/week: 7.0 standard drinks    Types: 7 Glasses of wine per week    Comment: 1 glass wine/day and shot 5 days per week  . Drug use: No  . Sexual activity: Not on file  Lifestyle  . Physical activity:    Days per week: Not on file    Minutes per session: Not on file   . Stress: Not on file  Relationships  . Social connections:    Talks on phone: Not on file    Gets together: Not on file    Attends religious service: Not on file    Active member of club or organization: Not on file    Attends meetings of clubs or organizations: Not on file    Relationship status: Not on file  . Intimate partner violence:    Fear of current or ex partner: Not on file    Emotionally abused: Not on file    Physically abused: Not on file    Forced sexual activity: Not on file  Other Topics Concern  . 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 LYMPH GLAND EXCISION Right 1979   axillary, benign  . MELANOMA EXCISION Right  03/2014   elbow  . ORCHIECTOMY Right 2001   followed by radiation  . TONSILLECTOMY  1959  . wide excision of melanoma on back  1979    Family History  Problem Relation Age of Onset  . Epilepsy Mother   . Esophageal cancer Father         smoker/drinker died at 39  . Breast cancer Sister         diagnosed at 3  . Alcohol abuse Brother     No Known Allergies  Current Outpatient Medications on File Prior to Visit  Medication Sig Dispense Refill  . acetaminophen (TYLENOL) 325 MG tablet Take 2 tablets (650 mg total) by mouth every 6 (six) hours as needed for mild pain (or Fever >/= 101).    . sildenafil (REVATIO) 20 MG tablet 1-2 tabs by mouth 30 minutes prior to sexual activity 50 tablet 1  . Testosterone 366 MG PLLT 1 application to arm daily.     No current facility-administered medications on file prior to visit.     BP 133/64 (BP Location: Left Arm, Patient Position: Sitting, Cuff Size: Small)   Pulse 62   Temp (!)  97.5 F (36.4 C) (Oral)   Resp 16   Ht 5\' 7"  (1.702 m)   Wt 214 lb (97.1 kg)   SpO2 100%   BMI 33.52 kg/m    Objective:   Physical Exam Constitutional:      General: He is not in acute distress.    Appearance: He is well-developed.  HENT:     Head: Normocephalic and atraumatic.  Cardiovascular:     Rate and Rhythm: Normal rate and regular rhythm.     Heart sounds: No murmur.  Pulmonary:     Effort: Pulmonary effort is normal. No respiratory distress.     Breath sounds: Normal breath sounds. No wheezing or rales.  Abdominal:     General: There is no distension.     Palpations: Abdomen is soft. There is no mass.     Tenderness: There is no guarding or rebound.     Comments: Mild epigastric tenderness  Skin:    General: Skin is warm and dry.  Neurological:     Mental Status: He is alert and oriented to person, place, and time.  Psychiatric:        Behavior: Behavior normal.        Thought Content: Thought content normal.           Assessment & Plan:  Pancreatitis-  Clinically resolving.  We discussed that most likely cause for his pancreatitis was daily alcohol use.  He understands that there is no definite safe amount of alcohol that he can consume without running the risk of inducing a pancreatitis flare again. He has not had any alcohol since his hospitalization.  Obtain follow up CBC, CMET, Lipase/amylase. Refer back to GI for consultation.  He would also like a referral to a nutritionist for help with weight loss and low fat diet.  He understands that he should let us know if he develops recurrent abdominal pain.    A total of 30  minutes were spent face-to-face with the patient during this encounter and over half of that time was spent on counseling and coordination of care. The patient was counseled on pancreatitis causes, treatment, prevention and nutritional counseling/alcohol abstinence.

## 2018-07-30 NOTE — Patient Instructions (Signed)
Please complete lab work prior to leaving. Continue to avoid alcohol.  Advance diet and activity slowly. Call if you develop recurrent abdominal pain, nausea/vomitting. You should be contacted about scheduling your appointment with GI and the nutritionist.

## 2018-08-02 ENCOUNTER — Ambulatory Visit: Payer: Medicare HMO | Admitting: Family

## 2018-08-25 ENCOUNTER — Ambulatory Visit: Payer: Medicare HMO | Admitting: Gastroenterology

## 2018-08-25 ENCOUNTER — Encounter: Payer: Self-pay | Admitting: Gastroenterology

## 2018-08-25 VITALS — BP 110/70 | HR 79 | Ht 67.0 in | Wt 207.0 lb

## 2018-08-25 DIAGNOSIS — K852 Alcohol induced acute pancreatitis without necrosis or infection: Secondary | ICD-10-CM | POA: Diagnosis not present

## 2018-08-25 DIAGNOSIS — Z1211 Encounter for screening for malignant neoplasm of colon: Secondary | ICD-10-CM | POA: Diagnosis not present

## 2018-08-25 DIAGNOSIS — Z800 Family history of malignant neoplasm of digestive organs: Secondary | ICD-10-CM | POA: Diagnosis not present

## 2018-08-25 DIAGNOSIS — K5900 Constipation, unspecified: Secondary | ICD-10-CM

## 2018-08-25 DIAGNOSIS — R69 Illness, unspecified: Secondary | ICD-10-CM | POA: Diagnosis not present

## 2018-08-25 NOTE — Patient Instructions (Addendum)
If you are age 71 or older, your body mass index should be between 23-30. Your Body mass index is 32.42 kg/m. If this is out of the aforementioned range listed, please consider follow up with your Primary Care Provider.  If you are age 56 or younger, your body mass index should be between 19-25. Your Body mass index is 32.42 kg/m. If this is out of the aformentioned range listed, please consider follow up with your Primary Care Provider.    Your provider has ordered Cologuard testing as an option for colon cancer screening. This is performed by [employer] and may be out of network with your insurance. PRIOR to completing the test, it is YOUR responsibility to contact your insurance about covered benefits for this test. Your out of pocket expense could be anywhere from $0.00 to $649.00.   When you call to check coverage with your insurer, please provide the following information:   -The ONLY provider of Cologuard is [place] code for Cologuard is [phone].  [employer] NPI # [id number]  -[employer] Tax ID # [id number]   We have already sent your demographic and insurance information to [employer] (phone number [phone]) and they should contact you within the next week regarding your test. If you have not heard from them within the next week, please call our office at [phone].     Thank you for entrusting me with your care and for choosing [facility], Dr. [place] Cellar

## 2018-08-25 NOTE — Progress Notes (Signed)
HPI :  73 year old male here for follow-up visit. history of melanoma x 2 and testicular cancer. He also has a strong FH of esophageal cancer, in his father around age 68.  He otherwise reports having had an adverse reaction to anesthesia in the past and has wished to avoid anesthesia and any invasive procedures if possible.  At her last visit he declined an EGD for Barrett's esophagus screening. He also declined optical colonoscopy for screening purposes. He preferred to use Cologuard for screening, which was negative on 10/08/15.  Since last visit he was unfortunately admitted to the hospital around Christmas time with severe abdominal pain and nausea and vomiting. He was found to have acute pancreatitis with a lipase of 3500. CT scan showed evidence of pancreatitis without any concerning pancreatic findings otherwise. Ultrasound of the abdomen showed no gallstones, his biliary tree was patent, liver appeared normal. On admission his AST was 92 and ALT was 70, these resolved during the course of admission and normalized. His triglyceride level was 28. Prior to hospitalization he reports having one drink of wine with dinner every day. On the weekends he may have 3-4 drinks in a particular night. He has abstained completely from alcohol since his hospitalization. Very generally is recovering from this, his abdominal pain has mostly resolved. He lost a few pounds while in the hospital, he has lost further weight through major dietary changes since hospitalization. He is eating a very low fat diet has been trying to lose weight, he is lost about 17 pounds since December. He denied any medications or supplements used around the time of his pancreatitis. I otherwise denies any problems with his bowels other than some mild constipation at times. He denies any blood in the stools. No dysphagia. No reflux symptoms. He denies any family history of pancreatic cancer or pancreatitis.   US abdomen 05/26/18 - no  gallstones, CBD normal, liver normal  CT abdomen 07/20/18 - pancreatitis, small liver cyst US abdomen 07/11/18 - no gallstones, normal CBD, mild perihepatic ascites  Last colonoscopy 10/2003 - normal exam without polyps   Past Medical History:  Diagnosis Date  . Melanoma ([place]) x 2  . Melanoma ([place])   . Melanoma ([place])   . Pancreatitis 06/2018   thought to be due to alcohol  . Testicular cancer ([place]) 2011   testicular--radiation  . Testicular tumor      Past Surgical History:  Procedure Laterality Date  . LYMPH GLAND EXCISION Right 1979   axillary, benign  . MELANOMA EXCISION Right 03/2014   elbow  . ORCHIECTOMY Right 2001   followed by radiation  . TONSILLECTOMY  1959  . wide excision of melanoma on back  1979   Family History  Problem Relation Age of Onset  . Epilepsy Mother   . Esophageal cancer Father         smoker/drinker died at 53  . Breast cancer Sister         diagnosed at 15  . Alcohol abuse Brother    Social History   Tobacco Use  . Smoking status: Former Smoker    Years: 30.00    Types: Cigarettes    Last attempt to quit: 09/06/1997    Years since quitting: 20.9  . Smokeless tobacco: Never Used  Substance Use Topics  . Alcohol use: Yes    Alcohol/week: 7.0 standard drinks    Types: 7 Glasses of wine per week    Comment: 1 glass wine/day and shot 5 days  per week  . Drug use: No   Current Outpatient Medications  Medication Sig Dispense Refill  . Testosterone 875 MG PLLT 1 application to arm daily.     No current facility-administered medications for this visit.    No Known Allergies   Review of Systems: All systems reviewed and negative except where noted in HPI.   Lab Results  Component Value Date   ALT 49 07/30/2018   AST 21 07/30/2018   ALKPHOS 92 07/30/2018   BILITOT 0.6 07/30/2018    Lab Results  Component Value Date   CREATININE 0.77 07/30/2018   BUN 10 07/30/2018   NA 140 07/30/2018   K 4.2 07/30/2018   CL 104 07/30/2018     CO2 26 07/30/2018    Lab Results  Component Value Date   WBC 7.0 07/30/2018   HGB 14.4 07/30/2018   HCT 42.3 07/30/2018   MCV 89.0 07/30/2018   PLT 330.0 07/30/2018      Physical Exam: BP 110/70   Pulse 79   Ht 5\' 7"  (1.702 m)   Wt 207 lb (93.9 kg)   BMI 32.42 kg/m  Constitutional: Pleasant,well-developed, male in no acute distress. HEENT: Normocephalic and atraumatic. Conjunctivae are normal. No scleral icterus. Neck supple.  Cardiovascular: Normal rate, regular rhythm.  Pulmonary/chest: Effort normal and breath sounds normal. No wheezing, rales or rhonchi. Abdominal: Soft, nondistended, nontender. There are no masses palpable. No hepatomegaly. Extremities: no edema Lymphadenopathy: No cervical adenopathy noted. Neurological: Alert and oriented to person place and time. Skin: Skin is warm and dry. No rashes noted. Psychiatric: Normal mood and affect. Behavior is normal.   ASSESSMENT AND PLAN: 73 year old male here for reassessment of the following issues:  Acute pancreatitis / alcohol use - he did not have any evidence of gallstones, triglyceride level normal. He denied any medications. In light of his daily alcohol use I suspect alcohol was the likely cause. AST greater than ALT elevation was noted. He has since completely abstained from alcohol and is recovered nicely. He's change his diet significantly with a low-fat diet and is doing well with this. Recommend he completely abstained from alcohol for the next few months. In the future he may have an occasional drink but should be seldom and certainly avoid daily alcohol intake. If he has recurrence of pain moving forward asked him to contact me, would consider repeat imaging of the pancreas but do not see any concerning lesions based on review of the recent CT scan. He agreed.  Constipation - recommend Miralax PRN for this  Colon cancer screening - he has declined optical colonoscopy but amenable to another Cologuard,  will order. If this is positive he would agree to colonoscopy.  Family history of esophageal cancer - I offered him a screening EGD, he declined.   [place] Cellar, MD [facility]

## 2018-08-27 ENCOUNTER — Telehealth: Payer: Self-pay | Admitting: *Deleted

## 2018-08-27 NOTE — Telephone Encounter (Signed)
Dr Havery Moros- At patient's visit 08/25/2018, cologard was ordered. However, we got note from [place] that insurance will not cover Cologard again until 10/2018 since patient had negative cologard 07/2015. Reminder placed for Jan to send Cologard in April 2020.

## 2018-08-27 NOTE — Telephone Encounter (Signed)
Thanks Dottie 

## 2018-09-24 DIAGNOSIS — Z85828 Personal history of other malignant neoplasm of skin: Secondary | ICD-10-CM | POA: Diagnosis not present

## 2018-09-24 DIAGNOSIS — Z85820 Personal history of malignant melanoma of skin: Secondary | ICD-10-CM | POA: Diagnosis not present

## 2018-09-24 DIAGNOSIS — C44319 Basal cell carcinoma of skin of other parts of face: Secondary | ICD-10-CM | POA: Diagnosis not present

## 2018-10-18 DIAGNOSIS — E291 Testicular hypofunction: Secondary | ICD-10-CM | POA: Diagnosis not present

## 2018-10-18 DIAGNOSIS — R948 Abnormal results of function studies of other organs and systems: Secondary | ICD-10-CM | POA: Diagnosis not present

## 2018-10-26 DIAGNOSIS — Z1212 Encounter for screening for malignant neoplasm of rectum: Secondary | ICD-10-CM | POA: Diagnosis not present

## 2018-10-26 DIAGNOSIS — Z1211 Encounter for screening for malignant neoplasm of colon: Secondary | ICD-10-CM | POA: Diagnosis not present

## 2018-10-27 ENCOUNTER — Telehealth: Payer: Self-pay

## 2018-10-27 NOTE — Telephone Encounter (Signed)
Spoke to pt.  He submitted his cologuard test this week.  Awaiting results.

## 2018-10-27 NOTE — Telephone Encounter (Signed)
-----   Message from Larina Bras, [place] sent at 08/27/2018  9:47 AM EST ----- See 08/27/2018 telephone note... pt needs cologard

## 2018-11-01 ENCOUNTER — Other Ambulatory Visit: Payer: Self-pay

## 2018-11-01 DIAGNOSIS — R69 Illness, unspecified: Secondary | ICD-10-CM | POA: Diagnosis not present

## 2018-11-01 LAB — COLOGUARD: Cologuard: NEGATIVE

## 2018-11-02 DIAGNOSIS — R69 Illness, unspecified: Secondary | ICD-10-CM | POA: Diagnosis not present

## 2018-11-08 ENCOUNTER — Encounter: Payer: Self-pay | Admitting: Family

## 2018-11-08 DIAGNOSIS — N401 Enlarged prostate with lower urinary tract symptoms: Secondary | ICD-10-CM | POA: Diagnosis not present

## 2018-11-08 DIAGNOSIS — E291 Testicular hypofunction: Secondary | ICD-10-CM | POA: Diagnosis not present

## 2018-11-08 DIAGNOSIS — R351 Nocturia: Secondary | ICD-10-CM | POA: Diagnosis not present

## 2018-11-08 DIAGNOSIS — N5201 Erectile dysfunction due to arterial insufficiency: Secondary | ICD-10-CM | POA: Diagnosis not present

## 2018-11-08 NOTE — Telephone Encounter (Signed)
Please contact pt and schedule a virtual visit  

## 2018-11-10 ENCOUNTER — Other Ambulatory Visit: Payer: Self-pay

## 2018-11-10 ENCOUNTER — Encounter: Payer: Self-pay | Admitting: Family

## 2018-11-10 ENCOUNTER — Ambulatory Visit (INDEPENDENT_AMBULATORY_CARE_PROVIDER_SITE_OTHER): Payer: Medicare HMO | Admitting: Family

## 2018-11-10 DIAGNOSIS — R634 Abnormal weight loss: Secondary | ICD-10-CM

## 2018-11-10 DIAGNOSIS — R109 Unspecified abdominal pain: Secondary | ICD-10-CM | POA: Diagnosis not present

## 2018-11-10 NOTE — Telephone Encounter (Signed)
Patient scheduled for today at 10:20

## 2018-11-10 NOTE — Addendum Note (Signed)
Addended by: Kelle Darting A on: 11/10/2018 03:43 PM   Modules accepted: Orders

## 2018-11-10 NOTE — Progress Notes (Signed)
Virtual Visit via Video Note  I connected with Stephen Evans on 11/10/18 at 10:20 AM EDT by a video enabled telemedicine application and verified that I am speaking with the correct person using two identifiers. This visit type was conducted due to national recommendations for restrictions regarding the COVID-19 Pandemic (e.g. social distancing).  This format is felt to be most appropriate for this patient at this time.   I discussed the limitations of evaluation and management by telemedicine and the availability of in person appointments. The patient expressed understanding and agreed to proceed.  Only the patient and myself were on today's video visit. The patient was at home and I was in my office at the time of today's visit.   History of Present Illness:  Patient is a 73 yr old male who presents today to discuss weight loss. Reports that his weight 2-3 weeks ago was 207.  Then the other day he weight himself and he was 196.  Today he reports dropping about 2 more pounds. He reports eating healthy and having very little alcohol, however he is concerned about the dramatic and rapid weight loss. Reports that he had an episode of epigastric abdominal pain and again 3 weeks ago. He was concerned that NSAID use could be coinciding with abdominal pain so he discontinued nsaids.   Reports overall energy and appetite are good. No other symptoms.   Wt Readings from Last 3 Encounters:  08/25/18 207 lb (93.9 kg)  07/30/18 214 lb (97.1 kg)  07/23/18 227 lb 6.4 oz (103.1 kg)      Observations/Objective:  Gen: Awake, alert, no acute distress Resp: Breathing is even and non-labored Psych: calm/pleasant demeanor Neuro: Alert and Oriented x 3, + facial symmetry, speech is clear.   Assessment and Plan:  Weight loss/epigastric pain- will obtain CMET, lipase, amylase, cbc and TSH to further evaluate. This will be followed by a CT scan of the abdomen/pelvis.  Need to rule out pancreatitis/pancreatic  malignancy. Also, he has hx of testicular cancer and I would like to make sure that he does not have any pelvic lymphadenopathy.   Follow Up Instructions:    I discussed the assessment and treatment plan with the patient. The patient was provided an opportunity to ask questions and all were answered. The patient agreed with the plan and demonstrated an understanding of the instructions.   The patient was advised to call back or seek an in-person evaluation if the symptoms worsen or if the condition fails to improve as anticipated.    Nance Pear, NP

## 2018-11-11 ENCOUNTER — Other Ambulatory Visit: Payer: Medicare HMO

## 2018-11-11 ENCOUNTER — Other Ambulatory Visit: Payer: Self-pay

## 2018-11-11 ENCOUNTER — Encounter: Payer: Self-pay | Admitting: Family

## 2018-11-11 DIAGNOSIS — R109 Unspecified abdominal pain: Secondary | ICD-10-CM

## 2018-11-11 DIAGNOSIS — R634 Abnormal weight loss: Secondary | ICD-10-CM

## 2018-11-11 NOTE — Addendum Note (Signed)
Addended by: Kelle Darting A on: 11/11/2018 03:45 PM   Modules accepted: Orders

## 2018-11-11 NOTE — Progress Notes (Signed)
Venipuncture unsuccessful and pt will return on Monday for another attempt.

## 2018-11-12 ENCOUNTER — Other Ambulatory Visit: Payer: Medicare HMO

## 2018-11-15 ENCOUNTER — Other Ambulatory Visit: Payer: Self-pay

## 2018-11-15 ENCOUNTER — Other Ambulatory Visit (INDEPENDENT_AMBULATORY_CARE_PROVIDER_SITE_OTHER): Payer: Medicare HMO

## 2018-11-15 DIAGNOSIS — R109 Unspecified abdominal pain: Secondary | ICD-10-CM | POA: Diagnosis not present

## 2018-11-15 DIAGNOSIS — R634 Abnormal weight loss: Secondary | ICD-10-CM | POA: Diagnosis not present

## 2018-11-15 LAB — COMPREHENSIVE METABOLIC PANEL
ALT: 26 U/L (ref 0–53)
AST: 15 U/L (ref 0–37)
Albumin: 4.3 g/dL (ref 3.5–5.2)
Alkaline Phosphatase: 82 U/L (ref 39–117)
BUN: 16 mg/dL (ref 6–23)
CO2: 28 mEq/L (ref 19–32)
Calcium: 9.6 mg/dL (ref 8.4–10.5)
Chloride: 104 mEq/L (ref 96–112)
Creatinine, Ser: 0.76 mg/dL (ref 0.40–1.50)
GFR: 101.24 mL/min (ref >60.00)
Glucose, Bld: 62 mg/dL — ABNORMAL LOW (ref 70–99)
Potassium: 3.9 mEq/L (ref 3.5–5.1)
Sodium: 140 mEq/L (ref 135–145)
Total Bilirubin: 0.5 mg/dL (ref 0.2–1.2)
Total Protein: 6.6 g/dL (ref 6.0–8.3)

## 2018-11-15 LAB — CBC WITH DIFFERENTIAL/PLATELET
Basophils Absolute: 0.1 10*3/uL (ref 0.0–0.1)
Basophils Relative: 0.9 % (ref 0.0–3.0)
Eosinophils Absolute: 0.3 10*3/uL (ref 0.0–0.7)
Eosinophils Relative: 4.4 % (ref 0.0–5.0)
HCT: 44.4 % (ref 39.0–52.0)
Hemoglobin: 14.6 g/dL (ref 13.0–17.0)
Lymphocytes Relative: 28.2 % (ref 12.0–46.0)
Lymphs Abs: 2 10*3/uL (ref 0.7–4.0)
MCHC: 32.9 g/dL (ref 30.0–36.0)
MCV: 90.4 fl (ref 78.0–100.0)
Monocytes Absolute: 0.4 10*3/uL (ref 0.1–1.0)
Monocytes Relative: 6.2 % (ref 3.0–12.0)
Neutro Abs: 4.2 10*3/uL (ref 1.4–7.7)
Neutrophils Relative %: 60.3 % (ref 43.0–77.0)
Platelets: 279 10*3/uL (ref 150.0–400.0)
RBC: 4.91 Mil/uL (ref 4.22–5.81)
RDW: 13.9 % (ref 11.5–15.5)
WBC: 7 10*3/uL (ref 4.0–10.5)

## 2018-11-15 LAB — LIPASE: Lipase: 48 U/L (ref 11.0–59.0)

## 2018-11-15 LAB — AMYLASE: Amylase: 65 U/L (ref 27–131)

## 2018-11-15 LAB — TSH: TSH: 1.74 u[IU]/mL (ref 0.35–4.50)

## 2018-11-16 ENCOUNTER — Encounter: Payer: Self-pay | Admitting: Family

## 2018-11-17 ENCOUNTER — Ambulatory Visit (HOSPITAL_BASED_OUTPATIENT_CLINIC_OR_DEPARTMENT_OTHER)
Admission: RE | Admit: 2018-11-17 | Discharge: 2018-11-17 | Disposition: A | Discharge status: Home / Self Care | Payer: Medicare HMO | Source: Ambulatory Visit | Attending: Family | Admitting: Family

## 2018-11-17 ENCOUNTER — Other Ambulatory Visit: Payer: Self-pay

## 2018-11-17 DIAGNOSIS — N400 Enlarged prostate without lower urinary tract symptoms: Secondary | ICD-10-CM | POA: Diagnosis not present

## 2018-11-17 DIAGNOSIS — K859 Acute pancreatitis without necrosis or infection, unspecified: Secondary | ICD-10-CM | POA: Diagnosis not present

## 2018-11-17 DIAGNOSIS — K5730 Diverticulosis of large intestine without perforation or abscess without bleeding: Secondary | ICD-10-CM | POA: Diagnosis not present

## 2018-11-17 DIAGNOSIS — R634 Abnormal weight loss: Secondary | ICD-10-CM | POA: Insufficient documentation

## 2018-11-17 MED ORDER — IOHEXOL 300 MG/ML  SOLN
100.0000 mL | Freq: Once | INTRAMUSCULAR | Status: AC | PRN
  Administered 2018-11-17: 10:00:00 100 mL via INTRAVENOUS

## 2018-11-22 NOTE — Progress Notes (Signed)
Pt Lost about 10 lbs in April over a 2 week period with no other changes. (previous weight loss was explainable after bieng in the hospital with pancreatitis and then making diet changes. Pt also reports some abd pain just below the rib cage that comes and goes.  PCP ordered labs and CT scan last week. Results are in [place].

## 2018-11-23 ENCOUNTER — Ambulatory Visit (INDEPENDENT_AMBULATORY_CARE_PROVIDER_SITE_OTHER): Payer: Medicare HMO | Admitting: Gastroenterology

## 2018-11-23 ENCOUNTER — Encounter: Payer: Self-pay | Admitting: Gastroenterology

## 2018-11-23 ENCOUNTER — Other Ambulatory Visit: Payer: Self-pay

## 2018-11-23 VITALS — Ht 67.0 in | Wt 196.0 lb

## 2018-11-23 DIAGNOSIS — R634 Abnormal weight loss: Secondary | ICD-10-CM

## 2018-11-23 DIAGNOSIS — R1013 Epigastric pain: Secondary | ICD-10-CM

## 2018-11-23 NOTE — Progress Notes (Signed)
Virtual Visit via Video Note  I connected with Stephen Evans on 11/23/18 at 10:00 AM EDT by a video enabled telemedicine application and verified that I am speaking with the correct person using two identifiers.   I discussed the limitations of evaluation and management by telemedicine and the availability of in person appointments. The patient expressed understanding and agreed to proceed.  THIS ENCOUNTER IS A VIRTUAL VISIT DUE TO COVID-19 - PATIENT WAS NOT SEEN IN THE OFFICE. PATIENT HAS CONSENTED TO VIRTUAL VISIT / TELEMEDICINE VISIT   Location of patient: home Location of provider: office Persons participating: myself, patient, patient's wife   HPI :  73 y/o male here for a follow up visit. I know him previously for history of alcohol related pancreatitis this past December.   Since I have last seen him he had recovered from the pancreatitis, stopped all alcohol, stablized weight around 207 lbs. He reports he was at this weight until early April, started losing some more weight unintentionally, dropped down to 196 lbs over the course of 2 weeks. He denied any change in his diet or exercise habits. Last 2 weeks weight has remained stable.  He has had a few bouts of upper abdominal pain, seems epigastric, bothering him recently. Pain has been intermittent and slight epigastric discomfort dating back actually over a few years. Since the start of the year he has had 3 or 4 episodes of pain, some of which has been severe, most severe about 10 days ago. Pain is epigastric area, feels underneath the ribs. He does think episodes usually occur after eating. He is not sure if possible related to Advil, taken following a crown placement. Pains last about 15 minutes and then pass. Appetite is down. He is eating smaller portions in general, does not have early satiety however . He has an occasional glass of wine, 3 in the past few weeks, not routinely any longer. No hard alcohol. No routine NSAIDS. He  denies any reflux or heartburn. No dysphagia. He took a trial of PPI for 3 weeks back in October for some of this, did not think it helped at all. The pains are sporadic and sometimes come and go without triggers, more recently have been bothersome.   No trouble with bowel habits. Occasional constipation, uses Miralax PRN, not daily. No blood.   He had labs per PCP which looked okay as well as a follow up CT scan which looked okay in regards to his pancrease.   Father had esophageal cancer around age 75.  Cologuard negative 10/27/2018  CT scan abdomen / pelvis with contrast 11/17/18 - normal pancreas, no inflammatory changes of the pancreas, some mild residual inflammatory changes in small bowel mesentery, diverticulosis,   US abdomen 05/26/18 - no gallstones, CBD normal, liver normal  CT abdomen 07/20/18 - pancreatitis, small liver cyst US abdomen 07/11/18 - no gallstones, normal CBD, mild perihepatic ascites  Last colonoscopy 10/2003 - normal exam without polyps  He reports during his first cancer surgery in late 20s, he had a reaction to anesthesia, he had to be intubated and told he had a "constricted airway". He is not sure of the details of what happened and what he received. He is also a very difficult IV stick. He did have a colonoscopy previously without any issues when living in [place] a while ago. No cardiopulmonary symptoms currently.    Past Medical History:  Diagnosis Date   Melanoma ([place]) x 2   Melanoma ([place])  Melanoma ([place])    Pancreatitis 06/2018   thought to be due to alcohol   Testicular cancer [facility]) 2011   testicular--radiation   Testicular tumor      Past Surgical History:  Procedure Laterality Date   LYMPH GLAND EXCISION Right 1979   axillary, benign   MELANOMA EXCISION Right 03/2014   elbow   ORCHIECTOMY Right 2001   followed by radiation   TONSILLECTOMY  1959   wide excision of melanoma on back  1979   Family History  Problem Relation  Age of Onset   Epilepsy Mother    Esophageal cancer Father         smoker/drinker died at 57   Breast cancer Sister         diagnosed at 40   Alcohol abuse Brother    Social History   Tobacco Use   Smoking status: Former Smoker    Years: 30.00    Types: Cigarettes    Last attempt to quit: 09/06/1997    Years since quitting: 21.2   Smokeless tobacco: Never Used  Substance Use Topics   Alcohol use: Yes    Alcohol/week: 7.0 standard drinks    Types: 7 Glasses of wine per week    Comment: 1 glass wine/day and shot 5 days per week   Drug use: No   Current Outpatient Medications  Medication Sig Dispense Refill   Testosterone 376 MG PLLT 1 application to arm daily.     No current facility-administered medications for this visit.    No Known Allergies   Review of Systems: All systems reviewed and negative except where noted in HPI.    Ct Abdomen Pelvis W Contrast  Result Date: 11/17/2018 CLINICAL DATA:  Abdominal pain. Unintentional weight loss of 30 lb in 3 months. Recent pancreatitis. Personal history of testicular carcinoma EXAM: CT ABDOMEN AND PELVIS WITH CONTRAST TECHNIQUE: Multidetector CT imaging of the abdomen and pelvis was performed using the standard protocol following bolus administration of intravenous contrast. CONTRAST:  145mL OMNIPAQUE IOHEXOL 300 MG/ML  SOLN COMPARISON:  07/20/2018 FINDINGS: Lower Chest: No acute findings. Hepatobiliary: No hepatic masses identified. A few tiny sub-cm hepatic cysts are again noted. Gallbladder is unremarkable. Pancreas: There has been near complete resolution of acute pancreatitis since previous study mild residual inflammatory changes are seen in the small bowel mesentery. No evidence of pancreatic mass or abnormal fluid collections. Spleen: Within normal limits in size and appearance. Adrenals/Urinary Tract: No masses identified. No evidence of hydronephrosis. Stomach/Bowel: No evidence of obstruction, inflammatory process or  abnormal fluid collections. Normal appendix visualized. Diverticulosis is seen mainly involving the sigmoid colon, however there is no evidence of diverticulitis. Vascular/Lymphatic: No pathologically enlarged lymph nodes. No abdominal aortic aneurysm. Aortic atherosclerosis. Reproductive:  Mildly enlarged prostate remain stable. Other:  None. Musculoskeletal:  No suspicious bone lesions identified. IMPRESSION: 1. Near complete resolution of acute pancreatitis since previous study. Mild residual inflammatory changes in small bowel mesenteric 2. Colonic diverticulosis. No radiographic evidence of diverticulitis. 3. Stable mildly enlarged prostate. Aortic Atherosclerosis (ICD10-I70.0). Electronically Signed   By: Earle Gell M.D.   On: 11/17/2018 12:37   Lab Results  Component Value Date   WBC 7.0 11/15/2018   HGB 14.6 11/15/2018   HCT 44.4 11/15/2018   MCV 90.4 11/15/2018   PLT 279.0 11/15/2018    Lab Results  Component Value Date   CREATININE 0.76 11/15/2018   BUN 16 11/15/2018   NA 140 11/15/2018   K 3.9 11/15/2018  CL 104 11/15/2018   CO2 28 11/15/2018    Lab Results  Component Value Date   ALT 26 11/15/2018   AST 15 11/15/2018   ALKPHOS 82 11/15/2018   BILITOT 0.5 11/15/2018     Physical Exam: Ht 5\' 7"  (1.702 m) Comment: pt provided over the phone   Wt 196 lb (88.9 kg) Comment: pt provided over the phone   BMI 30.70 kg/m   NA  ASSESSMENT AND PLAN:  73 y/o male here for reassessment of the following issues:  Epigastric pain / weight loss - history of pancreatitis thought to be related to alcohol last December, he has recovered from that, although has dropped some additional weight unintentionally and has had some sporadic epigastric pain bothering him. Repeat CT scan did not show anything concerning, repeat labs also normal. He's had a history of intermittent mild epigastric pain in the past. Trial of PPI did not help (only a few weeks), and prior US shows no gallstones.  Given his weight loss and epigastric pain, which has worsened lately, and his father's history of esophageal cancer, I think EGD is a reasonable next step to evaluate this. I discussed risks / benefits of EGD and anesthesia with him. He is agreeable to have the exam done but he has significant concerns about anesthesia and how they would handle his case given his prior history of an adverse reaction to anesthesia several years ago. To clarify if he can have this procedure at the [facility] versus hospital, I will reach out to the head of our anesthesia group to contact this patient and see where it is most appropriate to perform this case (he has had sedation for colonoscopy in the past and has done fine with that). Will await that discussion to schedule him. In the interim, he declined another trial of PPI or carafate, he can try some TUMS or gaviscon PRN if he has an acute episode to see if it helps at all. He agreed. Further recommendations pending the result. 35 minutes spent with patient on this visit.   Watkins Cellar, MD [facility]

## 2018-12-06 ENCOUNTER — Encounter: Payer: Self-pay | Admitting: Gastroenterology

## 2018-12-06 ENCOUNTER — Ambulatory Visit: Payer: Medicare HMO | Admitting: *Deleted

## 2018-12-06 ENCOUNTER — Other Ambulatory Visit: Payer: Self-pay

## 2018-12-06 VITALS — Ht 67.0 in | Wt 196.0 lb

## 2018-12-06 DIAGNOSIS — R634 Abnormal weight loss: Secondary | ICD-10-CM

## 2018-12-06 DIAGNOSIS — R1013 Epigastric pain: Secondary | ICD-10-CM

## 2018-12-06 NOTE — Progress Notes (Signed)
Pre-visit done via phone with the patient today. Patient denies any medical history changes since GI OV. Patient denies any allergies to eggs or soy. Patient denies any problems with anesthesia/sedation. Patient denies any oxygen use at home. Patient denies taking any diet/weight loss medications or blood thinners. Instructions sent via Mychart-pt aware and pt notified care-partner is to wait in the car.

## 2018-12-07 ENCOUNTER — Telehealth: Payer: Self-pay | Admitting: *Deleted

## 2018-12-07 NOTE — Telephone Encounter (Signed)
Covid-19 travel screening questions  Have you traveled in the last 14 days?no If yes where?  Do you now or have you had a fever in the last 14 days?no  Do you have any respiratory symptoms of shortness of breath or cough now or in the last 14 days?no  Do you have a medical history of Congestive Heart Failure?  Do you have a medical history of lung disease?  Do you have any family members or close contacts with diagnosed or suspected Covid-19? No  Pt made aware of care partner policy and will bring a mask with him. Sm

## 2018-12-08 ENCOUNTER — Ambulatory Visit (AMBULATORY_SURGERY_CENTER): Payer: Medicare HMO | Admitting: Gastroenterology

## 2018-12-08 ENCOUNTER — Encounter: Payer: Self-pay | Admitting: Gastroenterology

## 2018-12-08 ENCOUNTER — Other Ambulatory Visit: Payer: Self-pay

## 2018-12-08 VITALS — BP 130/76 | HR 56 | Temp 97.5°F | Resp 10 | Ht 67.0 in | Wt 196.0 lb

## 2018-12-08 DIAGNOSIS — K219 Gastro-esophageal reflux disease without esophagitis: Secondary | ICD-10-CM

## 2018-12-08 DIAGNOSIS — R1013 Epigastric pain: Secondary | ICD-10-CM | POA: Diagnosis not present

## 2018-12-08 DIAGNOSIS — K2980 Duodenitis without bleeding: Secondary | ICD-10-CM

## 2018-12-08 DIAGNOSIS — K449 Diaphragmatic hernia without obstruction or gangrene: Secondary | ICD-10-CM | POA: Diagnosis not present

## 2018-12-08 DIAGNOSIS — K3189 Other diseases of stomach and duodenum: Secondary | ICD-10-CM | POA: Diagnosis not present

## 2018-12-08 DIAGNOSIS — K317 Polyp of stomach and duodenum: Secondary | ICD-10-CM

## 2018-12-08 DIAGNOSIS — R634 Abnormal weight loss: Secondary | ICD-10-CM

## 2018-12-08 DIAGNOSIS — K229 Disease of esophagus, unspecified: Secondary | ICD-10-CM

## 2018-12-08 DIAGNOSIS — K2289 Other specified disease of esophagus: Secondary | ICD-10-CM

## 2018-12-08 DIAGNOSIS — K2970 Gastritis, unspecified, without bleeding: Secondary | ICD-10-CM | POA: Diagnosis not present

## 2018-12-08 MED ORDER — SODIUM CHLORIDE 0.9 % IV SOLN: 500.0000 mL | Freq: Once | INTRAVENOUS | Status: DC

## 2018-12-08 NOTE — Progress Notes (Signed)
To PACU, VSS. Report to [email or web address] 

## 2018-12-08 NOTE — Progress Notes (Signed)
Pt's states no medical or surgical changes since previsit or office visit. 

## 2018-12-08 NOTE — Patient Instructions (Signed)
Discharge instructions given. Handout on a hiatal hernia. Biopsies taken. Resume previous medications. YOU HAD AN ENDOSCOPIC PROCEDURE TODAY AT [facility]:   Refer to the procedure report that was given to you for any specific questions about what was found during the examination.  If the procedure report does not answer your questions, please call your gastroenterologist to clarify.  If you requested that your care partner not be given the details of your procedure findings, then the procedure report has been included in a sealed envelope for you to review at your convenience later.  YOU SHOULD EXPECT: Some feelings of bloating in the abdomen. Passage of more gas than usual.  Walking can help get rid of the air that was put into your GI tract during the procedure and reduce the bloating. If you had a lower endoscopy (such as a colonoscopy or flexible sigmoidoscopy) you may notice spotting of blood in your stool or on the toilet paper. If you underwent a bowel prep for your procedure, you may not have a normal bowel movement for a few days.  Please Note:  You might notice some irritation and congestion in your nose or some drainage.  This is from the oxygen used during your procedure.  There is no need for concern and it should clear up in a day or so.  SYMPTOMS TO REPORT IMMEDIATELY:    Following upper endoscopy (EGD)  Vomiting of blood or coffee ground material  New chest pain or pain under the shoulder blades  Painful or persistently difficult swallowing  New shortness of breath  Fever of 100F or higher  Black, tarry-looking stools  For urgent or emergent issues, a gastroenterologist can be reached at any hour by calling [phone].   DIET:  We do recommend a small meal at first, but then you may proceed to your regular diet.  Drink plenty of fluids but you should avoid alcoholic beverages for 24 hours.  ACTIVITY:  You should plan to take it easy for the rest of  today and you should NOT DRIVE or use heavy machinery until tomorrow (because of the sedation medicines used during the test).    FOLLOW UP: Our staff will call the number listed on your records 48-72 hours following your procedure to check on you and address any questions or concerns that you may have regarding the information given to you following your procedure. If we do not reach you, we will leave a message.  We will attempt to reach you two times.  During this call, we will ask if you have developed any symptoms of COVID 19. If you develop any symptoms (for example fever, flu-like symptoms, shortness of breath, cough etc.) before then, please call [phone].  If any biopsies were taken you will be contacted by phone or by letter within the next 1-3 weeks.  Please call us at [phone] if you have not heard about the biopsies in 3 weeks.    SIGNATURES/CONFIDENTIALITY: You and/or your care partner have signed paperwork which will be entered into your electronic medical record.  These signatures attest to the fact that that the information above on your After Visit Summary has been reviewed and is understood.  Full responsibility of the confidentiality of this discharge information lies with you and/or your care-partner.

## 2018-12-08 NOTE — Progress Notes (Signed)
Called to room to assist during endoscopic procedure.  Patient ID and intended procedure confirmed with present staff. Received instructions for my participation in the procedure from the performing physician.   Brunetta Jeans, Endo tech Gershon Crane, CRNA Trilby Leaver, RN Procedure Room Team

## 2018-12-08 NOTE — Progress Notes (Signed)
VS taken by Rica Mote CMA

## 2018-12-08 NOTE — Op Note (Signed)
[place] Patient Name: Stephen Evans Procedure Date: 12/08/2018 10:20 AM MRN: [id number] Endoscopist: Remo Lipps P. Havery Moros , MD Age: 73 Referring MD:  Date of Birth: [birth year] Gender: Male Account #: [id number] Procedure:                Upper GI endoscopy Indications:              Epigastric abdominal pain, Weight loss, Family                            history of esophageal cancer Medicines:                Monitored Anesthesia Care Procedure:                Pre-Anesthesia Assessment:                           - Prior to the procedure, a History and Physical                            was performed, and patient medications and                            allergies were reviewed. The patient's tolerance of                            previous anesthesia was also reviewed. The risks                            and benefits of the procedure and the sedation                            options and risks were discussed with the patient.                            All questions were answered, and informed consent                            was obtained. Prior Anticoagulants: The patient has                            taken no previous anticoagulant or antiplatelet                            agents. ASA Grade Assessment: II - A patient with                            mild systemic disease. After reviewing the risks                            and benefits, the patient was deemed in                            satisfactory condition to undergo the procedure.  After obtaining informed consent, the endoscope was                            passed under direct vision. Throughout the                            procedure, the patient's blood pressure, pulse, and                            oxygen saturations were monitored continuously. The                            Endoscope was introduced through the mouth, and                            advanced to the second part  of duodenum. The upper                            GI endoscopy was accomplished without difficulty.                            The patient tolerated the procedure well. Scope In: Scope Out: Findings:                 Esophagogastric landmarks were identified: the                            Z-line was found at 38 cm, the lower esophageal                            sphincter was found at 38 cm and the upper extent                            of the gastric folds was found at 39 cm from the                            incisors.                           A 1 cm hiatal hernia was present.                           Mucosal changes characterized by erythema with                            slight nodularity were found at the                            gastroesophageal junction fromthe 6 o'clock to 9                            o'clock position. Suspect benign inflammatory  change from reflux but biopsies were taken with a                            cold forceps for histology, ensure no Barrett's /                            dysplasia.                           The exam of the esophagus was otherwise normal.                           A few small sessile polyps were found in the                            gastric fundus and in the gastric body, 3-49mm in                            size. Biopsies were taken with a cold forceps for                            histology from a few of these.                           The exam of the stomach was otherwise normal.                           Biopsies were taken with a cold forceps in the                            gastric body, at the incisura and in the gastric                            antrum for Helicobacter pylori testing.                           Erythematous mucosa was found in the duodenal bulb                            without focal ulceration. Biopsies were taken with                            a cold forceps for  histology.                           The exam of the duodenum was otherwise normal. Complications:            No immediate complications. Estimated blood loss:                            Minimal. Estimated Blood Loss:     Estimated blood loss was minimal. Impression:               - Esophagogastric landmarks identified.                           -  1 cm hiatal hernia.                           - Mucosal changes at the GEJ as outlined. Biopsied.                           - Normal esophagus otherwise                           - A few benign appearing gastric polyps. Biopsied.                           - Normal stomach otherwise - biopsies taken to rule                            out H pylori                           - Erythematous duodenopathy. Biopsied. Recommendation:           - Patient has a contact number available for                            emergencies. The signs and symptoms of potential                            delayed complications were discussed with the                            patient. Return to normal activities tomorrow.                            Written discharge instructions were provided to the                            patient.                           - Resume previous diet.                           - Continue present medications.                           - Await pathology results with further                            recommendations. Remo Lipps P. Armbruster, MD 12/08/2018 11:05:11 AM This report has been signed electronically.

## 2018-12-10 ENCOUNTER — Telehealth: Payer: Self-pay | Admitting: *Deleted

## 2018-12-10 NOTE — Telephone Encounter (Signed)
  Follow up Call-  Call back number 12/08/2018  Post procedure Call Back phone  # [phone]  Permission to leave phone message Yes  Some recent data might be hidden    [facility]

## 2018-12-10 NOTE — Telephone Encounter (Signed)
  Follow up Call-  Call back number 12/08/2018  Post procedure Call Back phone  # [phone]  Permission to leave phone message Yes  Some recent data might be hidden    LMOM to call back if he has any questions or concerns regarding procedure; or if he developed any symptoms of the covid 19 or had any family member/close contacts diagnosed with the virus

## 2018-12-15 ENCOUNTER — Ambulatory Visit: Payer: Medicare HMO | Admitting: Neurology

## 2018-12-22 ENCOUNTER — Encounter: Payer: Self-pay | Admitting: Family

## 2019-01-06 ENCOUNTER — Encounter: Payer: Self-pay | Admitting: Family

## 2019-03-11 ENCOUNTER — Other Ambulatory Visit: Payer: Self-pay

## 2019-03-11 ENCOUNTER — Encounter (HOSPITAL_BASED_OUTPATIENT_CLINIC_OR_DEPARTMENT_OTHER): Payer: Self-pay

## 2019-03-11 ENCOUNTER — Emergency Department (HOSPITAL_BASED_OUTPATIENT_CLINIC_OR_DEPARTMENT_OTHER)
Admission: EM | Admit: 2019-03-11 | Discharge: 2019-03-11 | Disposition: A | Discharge status: Home / Self Care | Payer: Medicare HMO | Attending: Emergency Medicine | Admitting: Emergency Medicine

## 2019-03-11 DIAGNOSIS — Y93H2 Activity, gardening and landscaping: Secondary | ICD-10-CM | POA: Insufficient documentation

## 2019-03-11 DIAGNOSIS — Y999 Unspecified external cause status: Secondary | ICD-10-CM | POA: Diagnosis not present

## 2019-03-11 DIAGNOSIS — Z85820 Personal history of malignant melanoma of skin: Secondary | ICD-10-CM | POA: Insufficient documentation

## 2019-03-11 DIAGNOSIS — W293XXA Contact with powered garden and outdoor hand tools and machinery, initial encounter: Secondary | ICD-10-CM | POA: Diagnosis not present

## 2019-03-11 DIAGNOSIS — Z87891 Personal history of nicotine dependence: Secondary | ICD-10-CM | POA: Insufficient documentation

## 2019-03-11 DIAGNOSIS — Z8547 Personal history of malignant neoplasm of testis: Secondary | ICD-10-CM | POA: Insufficient documentation

## 2019-03-11 DIAGNOSIS — Y92017 Garden or yard in single-family (private) house as the place of occurrence of the external cause: Secondary | ICD-10-CM | POA: Diagnosis not present

## 2019-03-11 DIAGNOSIS — Z23 Encounter for immunization: Secondary | ICD-10-CM | POA: Diagnosis not present

## 2019-03-11 DIAGNOSIS — S61210A Laceration without foreign body of right index finger without damage to nail, initial encounter: Secondary | ICD-10-CM | POA: Insufficient documentation

## 2019-03-11 MED ORDER — TETANUS-DIPHTH-ACELL PERTUSSIS 5-2.5-18.5 LF-MCG/0.5 IM SUSP
0.5000 mL | Freq: Once | INTRAMUSCULAR | Status: AC
  Administered 2019-03-11: 0.5 mL via INTRAMUSCULAR
  Filled 2019-03-11: qty 0.5

## 2019-03-11 NOTE — ED Provider Notes (Signed)
[facility] EMERGENCY DEPARTMENT Provider Note   CSN: [id number] Arrival date & time: 03/11/19  1408     History   Chief Complaint Chief Complaint  Patient presents with  . Finger Injury    HPI Stephen Evans is a 73 y.o. male.     The history is provided by the patient and medical records. No language interpreter was used.   Stephen Evans is a 73 y.o. male  with a PMH as listed below who presents to the Emergency Department complaining of laceration to the right index finger which he cut on hedge clippers about 10 AM this morning.  He cleaned the area out really well with water.  He then put a bandage on it and was planning on no further treatment.  His wife came home and took the bandage off to look at the area and it began bleeding again.  It was controlled with direct pressure, but wife felt as if he needed to come to emergency department to have someone look at it.  He is unsure of his last tetanus vaccine.  Denies any pain to the site.  No numbness or weakness.  Past Medical History:  Diagnosis Date  . Melanoma ([place]) x 2  . Melanoma ([place])   . Melanoma ([place])   . Pancreatitis 06/2018   thought to be due to alcohol  . Testicular cancer ([place]) 2011   testicular--radiation  . Testicular tumor     Patient Active Problem List   Diagnosis Date Noted  . Sinus bradycardia 07/21/2018  . Acute pancreatitis 07/20/2018  . Trapezius strain 04/05/2014  . Routine general medical examination at a health care facility 09/22/2012  . Nonspecific abnormal electrocardiogram (ECG) (EKG) 09/22/2012  . Personal history of malignant melanoma 09/06/2012  . History of testicular cancer 09/06/2012  . Androgen deficiency 09/06/2012    Past Surgical History:  Procedure Laterality Date  . COLONOSCOPY  11/01/2003  . LYMPH GLAND EXCISION Right 1979   axillary, benign  . MELANOMA EXCISION Right 03/2014   elbow  . ORCHIECTOMY Right 2001   followed by radiation  . TONSILLECTOMY  1959   . wide excision of melanoma on back  1979        Home Medications    Prior to Admission medications   Medication Sig Start Date End Date Taking? Authorizing Provider  Multiple Vitamin (MULTIVITAMIN) tablet Take 1 tablet by mouth daily.    [provider]  Testosterone 563 MG PLLT 1 application to arm daily. 09/03/17   [provider]    Family History Family History  Problem Relation Age of Onset  . Epilepsy Mother   . Esophageal cancer Father         smoker/drinker died at 85  . Breast cancer Sister         diagnosed at 17  . Alcohol abuse Brother   . Colon cancer Neg Hx   . Colon polyps Neg Hx   . Rectal cancer Neg Hx   . Stomach cancer Neg Hx     Social History Social History   Tobacco Use  . Smoking status: Former Smoker    Years: 30.00    Types: Cigarettes    Quit date: 09/06/1997    Years since quitting: 21.5  . Smokeless tobacco: Never Used  Substance Use Topics  . Alcohol use: Yes    Comment: 2/weekly  . Drug use: No     Allergies   Patient has no known allergies.  Review of Systems Review of Systems  Musculoskeletal: Negative for arthralgias and myalgias.  Skin: Positive for wound.  Neurological: Negative for weakness and numbness.     Physical Exam Updated Vital Signs BP 135/86 (BP Location: Left Arm)   Pulse 83   Temp 98.5 F (36.9 C) (Oral)   Resp 18   Ht 5\' 7"  (1.702 m)   Wt 88.5 kg   SpO2 99%   BMI 30.54 kg/m   Physical Exam Vitals signs and nursing note reviewed.  Constitutional:      General: He is not in acute distress.    Appearance: He is well-developed.  HENT:     Head: Normocephalic and atraumatic.  Neck:     Musculoskeletal: Neck supple.  Cardiovascular:     Rate and Rhythm: Normal rate and regular rhythm.     Heart sounds: Normal heart sounds. No murmur.  Pulmonary:     Effort: Pulmonary effort is normal. No respiratory distress.     Breath sounds: Normal breath sounds. No wheezing or rales.   Musculoskeletal: Normal range of motion.        General: No tenderness.  Skin:    General: Skin is warm and dry.     Comments: 1.5 cm laceration that is quite superficial to the dorsum of the right index finger.  No surrounding erythema.  Neurological:     Mental Status: He is alert.      ED Treatments / Results  Labs (all labs ordered are listed, but only abnormal results are displayed) Labs Reviewed - No data to display  EKG None  Radiology No results found.  Procedures .Marland KitchenLaceration Repair  Date/Time: 03/11/2019 3:36 PM Performed by: , Ozella Almond, PA-C Authorized by: , Ozella Almond, PA-C   Consent:    Consent obtained:  Verbal   Consent given by:  Patient   Risks discussed:  Pain, infection, poor cosmetic result and poor wound healing Anesthesia (see MAR for exact dosages):    Anesthesia method:  None Laceration details:    Location:  Finger   Finger location:  R index finger   Length (cm):  1.5 Repair type:    Repair type:  Simple Pre-procedure details:    Preparation:  Patient was prepped and draped in usual sterile fashion Exploration:    Hemostasis achieved with:  Direct pressure   Wound exploration: wound explored through full range of motion and entire depth of wound probed and visualized   Treatment:    Wound cleansed with: Saline and ChloraPrep.   Amount of cleaning:  Standard   Irrigation solution:  Sterile saline   Visualized foreign bodies/material removed: no   Skin repair:    Repair method:  Steri-Strips   Number of Steri-Strips:  2 Approximation:    Approximation:  Close Post-procedure details:    Dressing:  Open (no dressing)   Patient tolerance of procedure:  Tolerated well, no immediate complications   (including critical care time)  Medications Ordered in ED Medications  Tdap (BOOSTRIX) injection 0.5 mL (0.5 mLs Intramuscular Given 03/11/19 1514)     Initial Impression / Assessment and Plan / ED Course  I have  reviewed the triage vital signs and the nursing notes.  Pertinent labs & imaging results that were available during my care of the patient were reviewed by me and considered in my medical decision making (see chart for details).       EAMES DIBIASIO is a 73 y.o. male who presents to ED for laceration  to the right index finger.  Neurovascularly intact.  Quite superficial.  Recommended placement of 1 or 2 sutures, however patient is adamant that he does not want any needle pokes or suturing.  He would like Steri-Strips if at all possible.  Discussed risks and benefits of both and he would like to proceed with Steri-Strip placement which was done as dictated above.  Discussed home wound care instructions as well as return precautions/signs of infection.  Tdap updated.  All questions answered.  Patient discussed with Dr. Laverta Baltimore who agrees with treatment plan.    Final Clinical Impressions(s) / ED Diagnoses   Final diagnoses:  Laceration of right index finger without foreign body without damage to nail, initial encounter    ED Discharge Orders    None       , Ozella Almond, PA-C 03/11/19 1538    Drenda Freeze, MD 03/11/19 [phone]

## 2019-03-11 NOTE — ED Triage Notes (Signed)
Pt states he cut right index finger on hedge clippers ~10am-NAD-steady gait

## 2019-03-11 NOTE — Discharge Instructions (Signed)
It was my pleasure taking care of you today!  Keep wound clean and dry.   Watch for signs of infection such as redness and swelling around the site.   Return to ER for signs of infection, new or worsening symptoms, any additional concerns.

## 2019-04-13 DIAGNOSIS — R69 Illness, unspecified: Secondary | ICD-10-CM | POA: Diagnosis not present

## 2019-04-14 ENCOUNTER — Encounter: Payer: Self-pay | Admitting: Family

## 2019-04-20 ENCOUNTER — Encounter: Payer: Self-pay | Admitting: Family

## 2019-04-21 NOTE — Telephone Encounter (Signed)
Patient advised to call for virtual visit in November and Lenna Sciara can order the test for them to have done prior to trip.

## 2019-05-02 ENCOUNTER — Telehealth: Payer: Medicare HMO | Admitting: Nurse Practitioner

## 2019-05-02 DIAGNOSIS — L247 Irritant contact dermatitis due to plants, except food: Secondary | ICD-10-CM

## 2019-05-02 MED ORDER — PREDNISONE 10 MG (21) PO TBPK: ORAL_TABLET | ORAL | 0 refills | Status: DC

## 2019-05-02 NOTE — Progress Notes (Signed)
E Visit for Rash  We are sorry that you are not feeling well. Here is how we plan to help!  Based on what you shared with me it looks like you have contact dermatitis.  Contact dermatitis is a skin rash caused by something that touches the skin and causes irritation or inflammation.  Your skin may be red, swollen, dry, cracked, and itch.  The rash should go away in a few days but can last a few weeks.  If you get a rash, it's important to figure out what caused it so the irritant can be avoided in the future.      Prednisone 10 mg daily for 6 days (see taper instructions below)  Directions for 6 day taper: Day 1: 2 tablets before breakfast, 1 after both lunch & dinner and 2 at bedtime Day 2: 1 tab before breakfast, 1 after both lunch & dinner and 2 at bedtime Day 3: 1 tab at each meal & 1 at bedtime Day 4: 1 tab at breakfast, 1 at lunch, 1 at bedtime Day 5: 1 tab at breakfast & 1 tab at bedtime Day 6: 1 tab at breakfast    HOME CARE:   Take cool showers and avoid direct sunlight.  Apply cool compress or wet dressings.  Take a bath in an oatmeal bath.  Sprinkle content of one Aveeno packet under running faucet with comfortably warm water.  Bathe for 15-20 minutes, 1-2 times daily.  Pat dry with a towel. Do not rub the rash.  Use hydrocortisone cream.  Take an antihistamine like Benadryl for widespread rashes that itch.  The adult dose of Benadryl is 25-50 mg by mouth 4 times daily.  Caution:  This type of medication may cause sleepiness.  Do not drink alcohol, drive, or operate dangerous machinery while taking antihistamines.  Do not take these medications if you have prostate enlargement.  Read package instructions thoroughly on all medications that you take.  GET HELP RIGHT AWAY IF:   Symptoms don't go away after treatment.  Severe itching that persists.  If you rash spreads or swells.  If you rash begins to smell.  If it blisters and opens or develops a yellow-brown  crust.  You develop a fever.  You have a sore throat.  You become short of breath.  MAKE SURE YOU:  Understand these instructions. Will watch your condition. Will get help right away if you are not doing well or get worse.  Thank you for choosing an e-visit. Your e-visit answers were reviewed by a board certified advanced clinical practitioner to complete your personal care plan. Depending upon the condition, your plan could have included both over the counter or prescription medications. Please review your pharmacy choice. Be sure that the pharmacy you have chosen is open so that you can pick up your prescription now.  If there is a problem you may message your provider in MyChart to have the prescription routed to another pharmacy. Your safety is important to us. If you have drug allergies check your prescription carefully.   For the next 24 hours, you can use MyChart to ask questions about today's visit, request a non-urgent call back, or ask for a work or school excuse from your e-visit provider. You will get an email in the next two days asking about your experience. I hope that your e-visit has been valuable and will speed your recovery.    5-10 minutes spent reviewing and documenting in chart.   

## 2019-05-04 ENCOUNTER — Emergency Department (HOSPITAL_BASED_OUTPATIENT_CLINIC_OR_DEPARTMENT_OTHER): Payer: Medicare HMO

## 2019-05-04 ENCOUNTER — Ambulatory Visit: Payer: Self-pay | Admitting: *Deleted

## 2019-05-04 ENCOUNTER — Encounter (HOSPITAL_BASED_OUTPATIENT_CLINIC_OR_DEPARTMENT_OTHER): Payer: Self-pay | Admitting: *Deleted

## 2019-05-04 ENCOUNTER — Emergency Department (HOSPITAL_BASED_OUTPATIENT_CLINIC_OR_DEPARTMENT_OTHER)
Admission: EM | Admit: 2019-05-04 | Discharge: 2019-05-04 | Disposition: A | Discharge status: Home / Self Care | Payer: Medicare HMO | Attending: Emergency Medicine | Admitting: Emergency Medicine

## 2019-05-04 ENCOUNTER — Other Ambulatory Visit: Payer: Self-pay

## 2019-05-04 DIAGNOSIS — R69 Illness, unspecified: Secondary | ICD-10-CM | POA: Diagnosis not present

## 2019-05-04 DIAGNOSIS — R1011 Right upper quadrant pain: Secondary | ICD-10-CM

## 2019-05-04 DIAGNOSIS — R001 Bradycardia, unspecified: Secondary | ICD-10-CM | POA: Diagnosis not present

## 2019-05-04 DIAGNOSIS — K8020 Calculus of gallbladder without cholecystitis without obstruction: Secondary | ICD-10-CM | POA: Diagnosis not present

## 2019-05-04 DIAGNOSIS — K853 Drug induced acute pancreatitis without necrosis or infection: Secondary | ICD-10-CM

## 2019-05-04 DIAGNOSIS — Z87891 Personal history of nicotine dependence: Secondary | ICD-10-CM | POA: Insufficient documentation

## 2019-05-04 DIAGNOSIS — Z79899 Other long term (current) drug therapy: Secondary | ICD-10-CM | POA: Diagnosis not present

## 2019-05-04 LAB — CBC WITH DIFFERENTIAL/PLATELET
Abs Immature Granulocytes: 0.05 10*3/uL (ref 0.00–0.07)
Basophils Absolute: 0 10*3/uL (ref 0.0–0.1)
Basophils Relative: 0 %
Eosinophils Absolute: 0 10*3/uL (ref 0.0–0.5)
Eosinophils Relative: 0 %
HCT: 46.3 % (ref 39.0–52.0)
Hemoglobin: 15 g/dL (ref 13.0–17.0)
Immature Granulocytes: 0 %
Lymphocytes Relative: 9 %
Lymphs Abs: 1.3 10*3/uL (ref 0.7–4.0)
MCH: 29.9 pg (ref 26.0–34.0)
MCHC: 32.4 g/dL (ref 30.0–36.0)
MCV: 92.4 fL (ref 80.0–100.0)
Monocytes Absolute: 0.6 10*3/uL (ref 0.1–1.0)
Monocytes Relative: 4 %
Neutro Abs: 12.7 10*3/uL — ABNORMAL HIGH (ref 1.7–7.7)
Neutrophils Relative %: 87 %
Platelets: 350 10*3/uL (ref 150–400)
RBC: 5.01 MIL/uL (ref 4.22–5.81)
RDW: 13.1 % (ref 11.5–15.5)
WBC: 14.7 10*3/uL — ABNORMAL HIGH (ref 4.0–10.5)
nRBC: 0 % (ref 0.0–0.2)

## 2019-05-04 LAB — COMPREHENSIVE METABOLIC PANEL
ALT: 42 U/L (ref 0–44)
AST: 40 U/L (ref 15–41)
Albumin: 4.5 g/dL (ref 3.5–5.0)
Alkaline Phosphatase: 84 U/L (ref 38–126)
Anion gap: 12 (ref 5–15)
BUN: 26 mg/dL — ABNORMAL HIGH (ref 8–23)
CO2: 25 mmol/L (ref 22–32)
Calcium: 9.9 mg/dL (ref 8.9–10.3)
Chloride: 102 mmol/L (ref 98–111)
Creatinine, Ser: 0.88 mg/dL (ref 0.61–1.24)
GFR calc Af Amer: >60 mL/min (ref >60)
GFR calc non Af Amer: >60 mL/min (ref >60)
Glucose, Bld: 127 mg/dL — ABNORMAL HIGH (ref 70–99)
Potassium: 3.9 mmol/L (ref 3.5–5.1)
Sodium: 139 mmol/L (ref 135–145)
Total Bilirubin: 0.6 mg/dL (ref 0.3–1.2)
Total Protein: 7.9 g/dL (ref 6.5–8.1)

## 2019-05-04 LAB — URINALYSIS, ROUTINE W REFLEX MICROSCOPIC
Bilirubin Urine: NEGATIVE
Glucose, UA: NEGATIVE mg/dL
Hgb urine dipstick: NEGATIVE
Ketones, ur: NEGATIVE mg/dL
Leukocytes,Ua: NEGATIVE
Nitrite: NEGATIVE
Protein, ur: NEGATIVE mg/dL
Specific Gravity, Urine: 1.02 (ref 1.005–1.030)
pH: 6 (ref 5.0–8.0)

## 2019-05-04 LAB — TROPONIN I (HIGH SENSITIVITY): Troponin I (High Sensitivity): 3 ng/L (ref <18)

## 2019-05-04 LAB — LIPASE, BLOOD: Lipase: 3949 U/L — ABNORMAL HIGH (ref 11–51)

## 2019-05-04 NOTE — ED Provider Notes (Signed)
[facility] EMERGENCY DEPARTMENT Provider Note   CSN: [id number] Arrival date & time: 05/04/19  1748     History   Chief Complaint Chief Complaint  Patient presents with  . Abdominal Pain    HPI Stephen Evans is a 73 y.o. male.     The history is provided by the patient, the spouse and medical records.  Abdominal Pain Pain location:  Epigastric and RUQ Pain quality: aching and pressure   Pain radiates to:  Does not radiate Pain severity:  Moderate Onset quality:  Gradual Duration:  2 days Timing:  Intermittent Progression:  Waxing and waning Chronicity:  New Context: not previous surgeries and not trauma   Relieved by:  Nothing Worsened by:  Nothing Ineffective treatments:  None tried Associated symptoms: belching   Associated symptoms: no chest pain, no chills, no constipation, no cough, no diarrhea, no dysuria, no fatigue, no fever, no nausea, no shortness of breath and no vomiting     Past Medical History:  Diagnosis Date  . Melanoma ([place]) x 2  . Melanoma ([place])   . Melanoma ([place])   . Pancreatitis 06/2018   thought to be due to alcohol  . Testicular cancer ([place]) 2011   testicular--radiation  . Testicular tumor     Patient Active Problem List   Diagnosis Date Noted  . Sinus bradycardia 07/21/2018  . Acute pancreatitis 07/20/2018  . Trapezius strain 04/05/2014  . Routine general medical examination at a health care facility 09/22/2012  . Nonspecific abnormal electrocardiogram (ECG) (EKG) 09/22/2012  . Personal history of malignant melanoma 09/06/2012  . History of testicular cancer 09/06/2012  . Androgen deficiency 09/06/2012    Past Surgical History:  Procedure Laterality Date  . COLONOSCOPY  11/01/2003  . LYMPH GLAND EXCISION Right 1979   axillary, benign  . MELANOMA EXCISION Right 03/2014   elbow  . ORCHIECTOMY Right 2001   followed by radiation  . TONSILLECTOMY  1959  . wide excision of melanoma on back  1979        Home  Medications    Prior to Admission medications   Medication Sig Start Date End Date Taking? Authorizing Provider  Multiple Vitamin (MULTIVITAMIN) tablet Take 1 tablet by mouth daily.    [provider]  predniSONE (STERAPRED UNI-PAK 21 TAB) 10 MG (21) TBPK tablet As directed x 6 days 05/02/19   Hassell Done, Mary-Margaret, FNP  Testosterone 123XX123 MG PLLT 1 application to arm daily. 09/03/17   [provider]    Family History Family History  Problem Relation Age of Onset  . Epilepsy Mother   . Esophageal cancer Father         smoker/drinker died at 64  . Breast cancer Sister         diagnosed at 39  . Alcohol abuse Brother   . Colon cancer Neg Hx   . Colon polyps Neg Hx   . Rectal cancer Neg Hx   . Stomach cancer Neg Hx     Social History Social History   Tobacco Use  . Smoking status: Former Smoker    Years: 30.00    Types: Cigarettes    Quit date: 09/06/1997    Years since quitting: 21.6  . Smokeless tobacco: Never Used  Substance Use Topics  . Alcohol use: Yes    Comment: 2/weekly  . Drug use: No     Allergies   Patient has no known allergies.   Review of Systems Review of Systems  Constitutional:  Positive for diaphoresis. Negative for chills, fatigue and fever.  HENT: Negative for congestion.   Eyes: Negative for visual disturbance.  Respiratory: Negative for cough, choking, chest tightness, shortness of breath and wheezing.   Cardiovascular: Negative for chest pain, palpitations and leg swelling.  Gastrointestinal: Positive for abdominal pain. Negative for abdominal distention, constipation, diarrhea, nausea and vomiting.  Genitourinary: Negative for dysuria and frequency.  Musculoskeletal: Negative for back pain, neck pain and neck stiffness.  Skin: Negative for rash and wound.  Neurological: Positive for light-headedness. Negative for dizziness, weakness and headaches.  Psychiatric/Behavioral: Negative for agitation.  All other systems reviewed  and are negative.    Physical Exam Updated Vital Signs BP (!) 160/72 (BP Location: Left Arm)   Pulse 78   Temp 98.6 F (37 C) (Oral)   Resp 16   SpO2 100%   Physical Exam Vitals signs and nursing note reviewed.  Constitutional:      General: He is not in acute distress.    Appearance: He is well-developed. He is not ill-appearing, toxic-appearing or diaphoretic.  HENT:     Head: Normocephalic and atraumatic.     Mouth/Throat:     Mouth: Mucous membranes are moist.     Pharynx: No pharyngeal swelling.  Eyes:     Conjunctiva/sclera: Conjunctivae normal.  Neck:     Musculoskeletal: Neck supple.  Cardiovascular:     Rate and Rhythm: Normal rate and regular rhythm.     Heart sounds: Normal heart sounds. No murmur.  Pulmonary:     Effort: Pulmonary effort is normal. No respiratory distress.     Breath sounds: Normal breath sounds.  Abdominal:     General: Abdomen is flat. Bowel sounds are normal. There is no distension.     Palpations: Abdomen is soft.     Tenderness: There is abdominal tenderness in the right upper quadrant and epigastric area. There is no right CVA tenderness, left CVA tenderness, guarding or rebound. Negative signs include Rovsing's sign.  Skin:    General: Skin is warm and dry.     Capillary Refill: Capillary refill takes less than 2 seconds.     Coloration: Skin is not pale.     Findings: No rash.  Neurological:     General: No focal deficit present.     Mental Status: He is alert.      ED Treatments / Results  Labs (all labs ordered are listed, but only abnormal results are displayed) Labs Reviewed  COMPREHENSIVE METABOLIC PANEL - Abnormal; Notable for the following components:      Result Value   Glucose, Bld 127 (*)    BUN 26 (*)    All other components within normal limits  CBC WITH DIFFERENTIAL/PLATELET - Abnormal; Notable for the following components:   WBC 14.7 (*)    Neutro Abs 12.7 (*)    All other components within normal limits   LIPASE, BLOOD - Abnormal; Notable for the following components:   Lipase 3,949 (*)    All other components within normal limits  URINE CULTURE  URINALYSIS, ROUTINE W REFLEX MICROSCOPIC  TROPONIN I (HIGH SENSITIVITY)  TROPONIN I (HIGH SENSITIVITY)    EKG EKG Interpretation  Date/Time:  Wednesday May 04 2019 19:10:32 EDT Ventricular Rate:  56 PR Interval:    QRS Duration: 92 QT Interval:  435 QTC Calculation: 420 R Axis:   81 Text Interpretation:  Sinus rhythm Borderline right axis deviation When compared to prior, t wave now upright in lead AVL compred  to prior.  No STEMI Confirmed by Antony Blackbird [phone]) on 05/04/2019 7:32:56 PM   Radiology US Abdomen Limited Ruq  Result Date: 05/04/2019 CLINICAL DATA:  Right upper quadrant pain for 1-2 days EXAM: ULTRASOUND ABDOMEN LIMITED RIGHT UPPER QUADRANT COMPARISON:  July 21, 2018 FINDINGS: Gallbladder: Shadowing gallstones are present. Gallbladder wall is normal in appearance measuring 2.8 mm. No sonographic Murphy sign noted by sonographer. Common bile duct: Diameter: 3.6 mm Liver: No focal lesion identified. Within normal limits in parenchymal echogenicity. Portal vein is patent on color Doppler imaging with normal direction of blood flow towards the liver. Other: None. IMPRESSION: Cholelithiasis without evidence of acute cholecystitis. Electronically Signed   By: Prudencio Pair M.D.   On: 05/04/2019 20:06    Procedures Procedures (including critical care time)  Medications Ordered in ED Medications - No data to display   Initial Impression / Assessment and Plan / ED Course  I have reviewed the triage vital signs and the nursing notes.  Pertinent labs & imaging results that were available during my care of the patient were reviewed by me and considered in my medical decision making (see chart for details).        JAHAVEN BILSON is a 73 y.o. male with a past medical history significant for prior testicular cancer, prior  melanoma, and prior pancreatitis who presents with abdominal discomfort.  Patient reports that for the last several days he has had right upper quadrant and epigastric abdominal pain.  He reports he has not had any increase in drinking recently.  He denies nausea, vomiting.  He does report a BM today that was normal.  He denies any blood in his stool.  He reports decrease in urine amount.  He denies chest pain, palpitations, or shortness of breath.  He did report when the pain worsened he got very diaphoretic and lightheaded [place] the [place] today.  He denies any trauma.  He reports it feels somewhat similar to prior pancreatitis but more in the right upper quadrant and left side.  On exam, patient does have tenderness in his right upper quadrant epigastrium.  No tenderness in the left upper quadrant.  Normal bowel sounds.  Normal sensation and strength in all extremities.  Good pulses in upper extremities.  Lungs clear chest and back nontender.  No CVA tenderness.  Patient slightly hypertensive in the [id number] to [id number] systolic on arrival.  Had a shared decision-making conversation with patient we agreed to get a right upper quad ultrasound and labs to evaluate.  Want to rule out acute cholecystitis or recurrent pancreatitis with lab work and ultrasound.  Due to lack of diffuse abdominal tenderness, lower suspicion for bowel obstruction or other significant intra-abdominal pathology at this time.    Anticipate reassessment after work-up.  9:17 PM Diagnostic laboratory work-up reveals mild leukocytosis 40.7 however with no significant for pancreatitis with a lipase of over 3900.  Suspect that his pancreatitis is due to the prednisone use for his recent rash.  Given his improving rash and overall well appearance of his rash, we had assured decision-making conversation and agreed to have him stop the steroids for the last 2 days and stay hydrated and rest.  He will watch his rash for worsening symptoms.   He will follow-up with his PCP for further management.  He agreed to avoid alcohol and fatty foods and agree with discharge.  Patient and family return precautions and patient was discharged in good condition.  Final Clinical Impressions(s) /  ED Diagnoses   Final diagnoses:  RUQ abdominal pain  Drug-induced acute pancreatitis, unspecified complication status    ED Discharge Orders    None     Clinical Impression: 1. Drug-induced acute pancreatitis, unspecified complication status   2. RUQ abdominal pain     Disposition: Discharge  Condition: Good  I have discussed the results, Dx and Tx plan with the pt(& family if present). He/she/they expressed understanding and agree(s) with the plan. Discharge instructions discussed at great length. Strict return precautions discussed and pt &/or family have verbalized understanding of the instructions. No further questions at time of discharge.    New Prescriptions   No medications on file    Follow Up: [name], NP [address] [place] [id number] [phone]        Esteven Overfelt, Gwenyth Allegra, MD 05/04/19 2119

## 2019-05-04 NOTE — Telephone Encounter (Signed)
   Reason for Disposition . [1] MILD-MODERATE pain AND [2] constant AND [3] present > 2 hours  Answer Assessment - Initial Assessment Questions 1. LOCATION: "Where does it hurt?"      Below ribcage center to right side of abdomen  2. RADIATION: "Does the pain shoot anywhere else?" (e.g., chest, back)     Does not radiate  3. ONSET: "When did the pain begin?" (Minutes, hours or days ago)      Mild discomfort last night, worse today 4. SUDDEN: "Gradual or sudden onset?"   Sudden 5. PATTERN "Does the pain come and go, or is it constant?"    - If constant: "Is it getting better, staying the same, or worsening?"      (Note: Constant means the pain never goes away completely; most serious pain is constant and it progresses)     - If intermittent: "How long does it last?" "Do you have pain now?"     (Note: Intermittent means the pain goes away completely between bouts)    Constant  6. SEVERITY: "How bad is the pain?"  (e.g., Scale 1-10; mild, moderate, or severe)    - MILD (1-3): doesn't interfere with normal activities, abdomen soft and not tender to touch     - MODERATE (4-7): interferes with normal activities or awakens from sleep, tender to touch     - SEVERE (8-10): excruciating pain, doubled over, unable to do any normal activities       6/10 on pain scale  7. RECURRENT SYMPTOM: "Have you ever had this type of abdominal pain before?" If so, ask: "When was the last time?" and "What happened that time?"      December 2019 8. CAUSE: "What do you think is causing the abdominal pain?"     Unsure  9. RELIEVING/AGGRAVATING FACTORS: "What makes it better or worse?" (e.g., movement, antacids, bowel movement)     Belching improves discomfort breifly 10. OTHER SYMPTOMS: "Has there been any vomiting, diarrhea, constipation, or urine problems?"       Gas  Protocols used: ABDOMINAL PAIN - MALE-A-AH  Spoke with patient.  He states he began having mild abdominal pain below his ribcage in the center  of his abdomen to the right side.  Today abdominal pain has been moderate 6/10 on pain scale.  He has been active today despite the discomfort.  He mowed his yard, and after lying down for about an hour went to play golf.  While playing golf he began to get dizzy and sweaty and continued to have abdominal pain.  He laid down for 20 minutes on the golf course, and drove himself home when he felt better.  He is home now and continues to have the abdominal pain.  He denies vomiting, diarrhea, constipation, chest pain, fever.  He states he had similar pain, but much more severe in 06/2018 when he was admitted to the hospital for pancreatitis.  Advised patient that given his symptoms and history he should have his wife take him to [facility] ED.  Patient expressed understanding and is agreeable.

## 2019-05-04 NOTE — ED Triage Notes (Signed)
Pt sent here by PMD  For abd pain eval , c/o right upper abd pain  X 2 days , denies n/v . Last BM today

## 2019-05-04 NOTE — Discharge Instructions (Signed)
Your work-up today revealed evidence of acute pancreatitis.  Based on your description of symptoms and history, we suspect it may be related to the steroid you have been on for the rash.  As we discussed, as you are nearly complete with your steroids, please stop them tonight and follow-up with your primary doctor.  We discussed if you needed further locations or imaging and although your ultrasound did show evidence of gallstones, did not show acute cholecystitis.  Based on your lack of significant abdominal tenderness on exam and her otherwise well appearance, we agreed to hold on CT imaging at this time.  We have low suspicion for necrosis or abscess.  If any symptoms change or worsen, please return to the nearest emergency department immediately.  Please rest and stay hydrated.

## 2019-05-04 NOTE — ED Notes (Signed)
Pt on monitor 

## 2019-05-05 ENCOUNTER — Encounter: Payer: Self-pay | Admitting: Family

## 2019-05-05 LAB — URINE CULTURE: Culture: NO GROWTH

## 2019-05-05 NOTE — Telephone Encounter (Signed)
FYI patient went to ED 

## 2019-05-09 ENCOUNTER — Encounter: Payer: Self-pay | Admitting: Family

## 2019-05-09 ENCOUNTER — Ambulatory Visit (INDEPENDENT_AMBULATORY_CARE_PROVIDER_SITE_OTHER): Payer: Medicare HMO | Admitting: Family

## 2019-05-09 DIAGNOSIS — L247 Irritant contact dermatitis due to plants, except food: Secondary | ICD-10-CM | POA: Diagnosis not present

## 2019-05-09 DIAGNOSIS — K859 Acute pancreatitis without necrosis or infection, unspecified: Secondary | ICD-10-CM

## 2019-05-09 DIAGNOSIS — E291 Testicular hypofunction: Secondary | ICD-10-CM | POA: Diagnosis not present

## 2019-05-09 NOTE — Progress Notes (Signed)
Virtual Visit via Video Note  I connected with Stephen Evans on 05/09/19 at 11:40 AM EDT by a video enabled telemedicine application and verified that I am speaking with the correct person using two identifiers.  Location: Patient: home Provider: home   I discussed the limitations of evaluation and management by telemedicine and the availability of in person appointments. The patient expressed understanding and agreed to proceed.  History of Present Illness:  Patient is a 73 yr old male who presents today to discuss rash.  Rash began after he did some outdoor trimming. He did an e-visit on 05/02/19 and was prescribed a prednisone taper. He took prednisone for 2 days and noticed improvement in his pruritis.  However, he developed pancreatitis which brought him to the ED on 10/7.  He was advised to d/c prednisone as it was felt that it may have precipitated his pancreatitis. He is scheduled for formal follow up of his pancreatitis later this week but reports that he is tolerating PO's and is no longer having abdominal pain.  Last episode of abdominal pain was on Wednesday 05/04/19.    Reports one on each forearm, one on each side of his torso.  A larger one inside the left knee. He sent photos which are reviewed in mychart.   Past Medical History:  Diagnosis Date  . Melanoma ([place]) x 2  . Melanoma ([place])   . Melanoma ([place])   . Pancreatitis 06/2018   thought to be due to alcohol  . Testicular cancer ([place]) 2011   testicular--radiation  . Testicular tumor      Social History   Socioeconomic History  . Marital status: Married    Spouse name: Not on file  . Number of children: 2  . Years of education: Not on file  . Highest education level: Not on file  Occupational History  . Occupation: retired    Comment: Arts development officer  Social Needs  . Financial resource strain: Not on file  . Food insecurity    Worry: Not on file    Inability: Not on file  . Transportation needs     Medical: Not on file    Non-medical: Not on file  Tobacco Use  . Smoking status: Former Smoker    Years: 30.00    Types: Cigarettes    Quit date: 09/06/1997    Years since quitting: 21.6  . Smokeless tobacco: Never Used  Substance and Sexual Activity  . Alcohol use: Yes    Comment: 2/weekly  . Drug use: No  . Sexual activity: Not on file  Lifestyle  . Physical activity    Days per week: Not on file    Minutes per session: Not on file  . Stress: Not on file  Relationships  . Social Herbalist on phone: Not on file    Gets together: Not on file    Attends religious service: Not on file    Active member of club or organization: Not on file    Attends meetings of clubs or organizations: Not on file    Relationship status: Not on file  . Intimate partner violence    Fear of current or ex partner: Not on file    Emotionally abused: Not on file    Physically abused: Not on file    Forced sexual activity: Not on file  Other Topics Concern  . 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 COLONOSCOPY  11/01/2003  . LYMPH GLAND EXCISION Right 1979   axillary, benign  . MELANOMA EXCISION Right 03/2014   elbow  . ORCHIECTOMY Right 2001   followed by radiation  . TONSILLECTOMY  1959  . wide excision of melanoma on back  1979    Family History  Problem Relation Age of Onset  . Epilepsy Mother   . Esophageal cancer Father         smoker/drinker died at 66  . Breast cancer Sister         diagnosed at 50  . Alcohol abuse Brother   . Colon cancer Neg Hx   . Colon polyps Neg Hx   . Rectal cancer Neg Hx   . Stomach cancer Neg Hx     No Known  Allergies  Current Outpatient Medications on File Prior to Visit  Medication Sig Dispense Refill  . Multiple Vitamin (MULTIVITAMIN) tablet Take 1 tablet by mouth daily.    . Testosterone 123XX123 MG PLLT 1 application to arm daily.    . predniSONE (STERAPRED UNI-PAK 21 TAB) 10 MG (21) TBPK tablet As directed x 6 days 21 tablet 0   Current Facility-Administered Medications on File Prior to Visit  Medication Dose Route Frequency Provider Last Rate Last Dose  . 0.9 %  sodium chloride infusion  500 mL Intravenous Once Armbruster, Carlota Raspberry, MD        There were no vitals taken for this visit.     Observations/Objective:   Gen: Awake, alert, no acute distress Resp: Breathing is even and non-labored Psych: calm/pleasant demeanor Neuro: Alert and Oriented x 3, + facial symmetry, speech is clear.   Assessment and Plan:  Contact dermatitis- reviewed photos and hx.  I think most likely due to poison ivy. He is currently using otc hydrocortisone and benadryl HS. Advised pt to continue these treatments and to add claritin or zyrtec once daily during the day if needed for itching.  Would avoid further oral steroids at this time.  Pancreatitis- clinically stable.  Plan to obtain follow up labs at his in person appointment on Friday.   Follow Up Instructions:    I discussed the assessment and treatment plan with the patient. The patient was provided an opportunity to ask questions and all were answered. The patient agreed with the plan and demonstrated an understanding of the instructions.   The patient was advised to call back or seek an in-person evaluation if the symptoms worsen or if the condition fails to improve as anticipated.  Nance Pear, NP

## 2019-05-11 ENCOUNTER — Other Ambulatory Visit: Payer: Self-pay

## 2019-05-13 ENCOUNTER — Other Ambulatory Visit: Payer: Self-pay

## 2019-05-13 ENCOUNTER — Encounter: Payer: Self-pay | Admitting: Family

## 2019-05-13 ENCOUNTER — Ambulatory Visit (INDEPENDENT_AMBULATORY_CARE_PROVIDER_SITE_OTHER): Payer: Medicare HMO | Admitting: Family

## 2019-05-13 VITALS — BP 136/64 | HR 67 | Temp 96.4°F | Resp 16 | Ht 67.0 in | Wt 196.0 lb

## 2019-05-13 DIAGNOSIS — L259 Unspecified contact dermatitis, unspecified cause: Secondary | ICD-10-CM

## 2019-05-13 DIAGNOSIS — R69 Illness, unspecified: Secondary | ICD-10-CM | POA: Diagnosis not present

## 2019-05-13 DIAGNOSIS — H8111 Benign paroxysmal vertigo, right ear: Secondary | ICD-10-CM

## 2019-05-13 DIAGNOSIS — K853 Drug induced acute pancreatitis without necrosis or infection: Secondary | ICD-10-CM

## 2019-05-13 LAB — CBC WITH DIFFERENTIAL/PLATELET
Basophils Absolute: 0.1 10*3/uL (ref 0.0–0.1)
Basophils Relative: 1.1 % (ref 0.0–3.0)
Eosinophils Absolute: 0.5 10*3/uL (ref 0.0–0.7)
Eosinophils Relative: 7.5 % — ABNORMAL HIGH (ref 0.0–5.0)
HCT: 44 % (ref 39.0–52.0)
Hemoglobin: 14.6 g/dL (ref 13.0–17.0)
Lymphocytes Relative: 28.5 % (ref 12.0–46.0)
Lymphs Abs: 1.9 10*3/uL (ref 0.7–4.0)
MCHC: 33.2 g/dL (ref 30.0–36.0)
MCV: 91.1 fl (ref 78.0–100.0)
Monocytes Absolute: 0.4 10*3/uL (ref 0.1–1.0)
Monocytes Relative: 6.9 % (ref 3.0–12.0)
Neutro Abs: 3.7 10*3/uL (ref 1.4–7.7)
Neutrophils Relative %: 56 % (ref 43.0–77.0)
Platelets: 272 10*3/uL (ref 150.0–400.0)
RBC: 4.83 Mil/uL (ref 4.22–5.81)
RDW: 13.6 % (ref 11.5–15.5)
WBC: 6.5 10*3/uL (ref 4.0–10.5)

## 2019-05-13 LAB — COMPREHENSIVE METABOLIC PANEL
ALT: 20 U/L (ref 0–53)
AST: 11 U/L (ref 0–37)
Albumin: 4.2 g/dL (ref 3.5–5.2)
Alkaline Phosphatase: 79 U/L (ref 39–117)
BUN: 17 mg/dL (ref 6–23)
CO2: 27 mEq/L (ref 19–32)
Calcium: 9.4 mg/dL (ref 8.4–10.5)
Chloride: 105 mEq/L (ref 96–112)
Creatinine, Ser: 0.77 mg/dL (ref 0.40–1.50)
GFR: 99.59 mL/min (ref >60.00)
Glucose, Bld: 96 mg/dL (ref 70–99)
Potassium: 4.2 mEq/L (ref 3.5–5.1)
Sodium: 141 mEq/L (ref 135–145)
Total Bilirubin: 0.5 mg/dL (ref 0.2–1.2)
Total Protein: 6.4 g/dL (ref 6.0–8.3)

## 2019-05-13 LAB — LIPASE: Lipase: 53 U/L (ref 11.0–59.0)

## 2019-05-13 NOTE — Progress Notes (Signed)
Subjective:    Patient ID: Stephen Evans, male    DOB: Dec 08, 1945, 73 y.o.   MRN: [id number]  HPI  Patient is a 73 yr old male who presents today for follow up of his pancreatitis.    We saw him on 10/12 due to contact dermatitis. We recommended otc hydrodone, HS benadryl prn and daily claritin or zyrtec. Reports that the affected areas are significantly less pruritic.  Dizziness when laying on his right side.  Started about 1 month ago.  Never on the left side.  Goes away in a few seconds.  Review of Systems   See HPI  Past Medical History:  Diagnosis Date  . Melanoma ([place]) x 2  . Melanoma ([place])   . Melanoma ([place])   . Pancreatitis 06/2018   thought to be due to alcohol  . Testicular cancer ([place]) 2011   testicular--radiation  . Testicular tumor      Social History   Socioeconomic History  . Marital status: Married    Spouse name: Not on file  . Number of children: 2  . Years of education: Not on file  . Highest education level: Not on file  Occupational History  . Occupation: retired    Comment: Arts development officer  Social Needs  . Financial resource strain: Not on file  . Food insecurity    Worry: Not on file    Inability: Not on file  . Transportation needs    Medical: Not on file    Non-medical: Not on file  Tobacco Use  . Smoking status: Former Smoker    Years: 30.00    Types: Cigarettes    Quit date: 09/06/1997    Years since quitting: 21.6  . Smokeless tobacco: Never Used  Substance and Sexual Activity  . Alcohol use: Yes    Comment: 2/weekly  . Drug use: No  . Sexual activity: Not on file  Lifestyle  . Physical activity    Days per week: Not on file    Minutes per session: Not on file  . Stress: Not on file  Relationships  . Social Herbalist on phone: Not on file    Gets together: Not on file    Attends religious service: Not on file    Active member of club or organization: Not on file    Attends meetings of clubs or  organizations: Not on file    Relationship status: Not on file  . Intimate partner violence    Fear of current or ex partner: Not on file    Emotionally abused: Not on file    Physically abused: Not on file    Forced sexual activity: Not on file  Other Topics Concern  . 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 COLONOSCOPY  11/01/2003  . LYMPH GLAND EXCISION Right 1979   axillary, benign  . MELANOMA EXCISION Right 03/2014   elbow  . ORCHIECTOMY Right 2001   followed by radiation  . TONSILLECTOMY  1959  . wide excision of melanoma on back  1979    Family History  Problem Relation  Age of Onset  . Epilepsy Mother   . Esophageal cancer Father         smoker/drinker died at 18  . Breast cancer Sister         diagnosed at 54  . Alcohol abuse Brother   . Colon cancer Neg Hx   . Colon polyps Neg Hx   . Rectal cancer Neg Hx   . Stomach cancer Neg Hx     No Known Allergies  Current Outpatient Medications on File Prior to Visit  Medication Sig Dispense Refill  . Multiple Vitamin (MULTIVITAMIN) tablet Take 1 tablet by mouth daily.    . Testosterone 123XX123 MG PLLT 1 application to arm daily.     Current Facility-Administered Medications on File Prior to Visit  Medication Dose Route Frequency Provider Last Rate Last Dose  . 0.9 %  sodium chloride infusion  500 mL Intravenous Once Armbruster, Carlota Raspberry, MD        BP 136/64 (BP Location: Left Arm, Patient Position: Sitting, Cuff Size: Small)   Pulse 67   Temp (!) 96.4 F (35.8 C) (Temporal)   Resp 16   Ht 5\' 7"  (1.702 m)   Wt 196 lb (88.9 kg)   SpO2 100%   BMI 30.70 kg/m       Objective:   Physical Exam  Constitutional:      General: He is not in acute distress.    Appearance: He is well-developed.  HENT:     Head: Normocephalic and atraumatic.  Cardiovascular:     Rate and Rhythm: Normal rate and regular rhythm.     Heart sounds: No murmur.  Pulmonary:     Effort: Pulmonary effort is normal. No respiratory distress.     Breath sounds: Normal breath sounds. No wheezing or rales.  Abdominal:     General: Bowel sounds are normal. There is no distension.     Palpations: Abdomen is soft. There is no mass.     Tenderness: There is no abdominal tenderness. There is no guarding.     Hernia: No hernia is present.  Skin:    General: Skin is warm and dry.          Comments: Erythematous blistering rash noted, most noteable on right shin  Neurological:     Mental Status: He is alert and oriented to person, place, and time.     Comments: + Dix Hallpike maneuver noted on right (+ nystagmus, + dizziness)  Psychiatric:        Behavior: Behavior normal.        Thought Content: Thought content normal.           Assessment & Plan:  BPV- new. Offered referral for vestibular rehab.  He will think about it and let me know if he wishes to proceed.  Pancreatitis- clinically resolved. Obtain follow up lab work.  Contact dermatitis- clinically improving. Continue topical hydrocortisone prn.

## 2019-05-13 NOTE — Patient Instructions (Signed)
Please complete lab work prior to leaving. Let me know if you would like to be referred for vestibular Rehab for your vertigo.

## 2019-05-18 DIAGNOSIS — E291 Testicular hypofunction: Secondary | ICD-10-CM | POA: Diagnosis not present

## 2019-05-18 DIAGNOSIS — N401 Enlarged prostate with lower urinary tract symptoms: Secondary | ICD-10-CM | POA: Diagnosis not present

## 2019-05-18 DIAGNOSIS — R351 Nocturia: Secondary | ICD-10-CM | POA: Diagnosis not present

## 2019-05-18 DIAGNOSIS — N5201 Erectile dysfunction due to arterial insufficiency: Secondary | ICD-10-CM | POA: Diagnosis not present

## 2019-05-30 ENCOUNTER — Encounter: Payer: Self-pay | Admitting: Family

## 2019-05-30 DIAGNOSIS — Z20822 Contact with and (suspected) exposure to covid-19: Secondary | ICD-10-CM

## 2019-05-30 DIAGNOSIS — Z20828 Contact with and (suspected) exposure to other viral communicable diseases: Secondary | ICD-10-CM

## 2019-06-06 ENCOUNTER — Other Ambulatory Visit: Payer: Self-pay

## 2019-06-06 DIAGNOSIS — Z20822 Contact with and (suspected) exposure to covid-19: Secondary | ICD-10-CM

## 2019-06-07 LAB — NOVEL CORONAVIRUS, NAA: SARS-CoV-2, NAA: NOT DETECTED

## 2019-06-21 ENCOUNTER — Encounter: Payer: Medicare HMO | Admitting: Family

## 2019-07-07 ENCOUNTER — Other Ambulatory Visit: Payer: Self-pay

## 2019-07-08 ENCOUNTER — Encounter: Payer: Medicare HMO | Admitting: Family

## 2019-07-15 DIAGNOSIS — D1801 Hemangioma of skin and subcutaneous tissue: Secondary | ICD-10-CM | POA: Diagnosis not present

## 2019-07-15 DIAGNOSIS — Z85820 Personal history of malignant melanoma of skin: Secondary | ICD-10-CM | POA: Diagnosis not present

## 2019-07-15 DIAGNOSIS — Z85828 Personal history of other malignant neoplasm of skin: Secondary | ICD-10-CM | POA: Diagnosis not present

## 2019-07-15 DIAGNOSIS — L821 Other seborrheic keratosis: Secondary | ICD-10-CM | POA: Diagnosis not present

## 2019-07-15 DIAGNOSIS — L814 Other melanin hyperpigmentation: Secondary | ICD-10-CM | POA: Diagnosis not present

## 2019-08-11 ENCOUNTER — Other Ambulatory Visit: Payer: Self-pay

## 2019-08-12 ENCOUNTER — Ambulatory Visit (INDEPENDENT_AMBULATORY_CARE_PROVIDER_SITE_OTHER): Payer: Medicare HMO | Admitting: Family

## 2019-08-12 ENCOUNTER — Other Ambulatory Visit: Payer: Self-pay

## 2019-08-12 ENCOUNTER — Encounter: Payer: Self-pay | Admitting: Family

## 2019-08-12 VITALS — BP 121/71 | HR 70 | Temp 98.4°F | Resp 16 | Ht 67.0 in | Wt 197.0 lb

## 2019-08-12 DIAGNOSIS — Z8719 Personal history of other diseases of the digestive system: Secondary | ICD-10-CM | POA: Diagnosis not present

## 2019-08-12 DIAGNOSIS — Z0000 Encounter for general adult medical examination without abnormal findings: Secondary | ICD-10-CM | POA: Diagnosis not present

## 2019-08-12 DIAGNOSIS — K859 Acute pancreatitis without necrosis or infection, unspecified: Secondary | ICD-10-CM

## 2019-08-12 DIAGNOSIS — R42 Dizziness and giddiness: Secondary | ICD-10-CM

## 2019-08-12 MED ORDER — SHINGRIX 50 MCG/0.5ML IM SUSR: INTRAMUSCULAR | 1 refills | Status: DC

## 2019-08-12 NOTE — Progress Notes (Signed)
Subjective:    Patient ID: Stephen Evans, male    DOB: August 08, 1945, 74 y.o.   MRN: [id number]  HPI  Patient is a 74 yr old male who presents today for follow up.  Immunizations:due for pneumovax 23 and shingrix.  Diet: reports diet is healthy Exercise: golfs and walks the course 2-3 times a week Wt Readings from Last 3 Encounters:  08/12/19 197 lb (89.4 kg)  05/13/19 196 lb (88.9 kg)  03/11/19 195 lb (88.5 kg)  Colonoscopy: due (last was 2005) Vision: due Dental: regularly  Reports resolution of his recent vertigo flare.  Recurrent pancreatitis- reports that 2 days after his last visit he had severe abdominal pain LUQ.  Rested for 3 hrs, no further symptoms.  Reports a small glass of wine once a week and one scotch every 3 weeks.   Review of Systems  Constitutional: Negative for unexpected weight change.  HENT: Negative for hearing loss and rhinorrhea.   Eyes: Negative for visual disturbance.  Respiratory: Negative for cough and shortness of breath.   Cardiovascular: Negative for chest pain.  Gastrointestinal: Positive for constipation (intermittent- managed with diet and prn dulcolax). Negative for diarrhea.  Genitourinary: Negative for dysuria and frequency.  Neurological: Negative for speech difficulty and headaches.  Hematological: Negative for adenopathy.  Psychiatric/Behavioral:       Denies depression/anxiety   Past Medical History:  Diagnosis Date  . Melanoma ([place]) x 2  . Melanoma ([place])   . Melanoma ([place])   . Pancreatitis 06/2018   thought to be due to alcohol  . Testicular cancer ([place]) 2011   testicular--radiation  . Testicular tumor      Social History   Socioeconomic History  . Marital status: Married    Spouse name: Not on file  . Number of children: 2  . Years of education: Not on file  . Highest education level: Not on file  Occupational History  . Occupation: retired    Comment: Arts development officer  Tobacco Use  . Smoking status: Former  Smoker    Years: 30.00    Types: Cigarettes    Quit date: 09/06/1997    Years since quitting: 21.9  . Smokeless tobacco: Never Used  Substance and Sexual Activity  . Alcohol use: Yes    Comment: 2/weekly  . Drug use: No  . Sexual activity: Not on file  Other Topics Concern  . 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 Difficulty of Paying Living Expenses: Not on file  Food Insecurity:   . Worried About Charity fundraiser in the Last Year: Not on file  . Ran Out of Food in the Last Year: Not on file  Transportation Needs:   . Lack of Transportation (Medical): Not on file  . Lack of Transportation (Non-Medical): Not on file  Physical Activity:   . Days of Exercise per Week: Not on file  . Minutes of Exercise per Session: Not on file  Stress:   . Feeling of Stress : Not on file  Social Connections:   . Frequency of Communication with Friends and Family: Not on  file  . Frequency of Social Gatherings with Friends and Family: Not on file  . Attends Religious Services: Not on file  . Active Member of Clubs or Organizations: Not on file  . Attends Archivist Meetings: Not on file  . Marital Status: Not on file  Intimate Partner Violence:   . Fear of Current or Ex-Partner: Not on file  . Emotionally Abused: Not on file  . Physically Abused: Not on file  . Sexually Abused: Not on file    Past Surgical History:  Procedure Laterality Date  . COLONOSCOPY  11/01/2003  . LYMPH GLAND EXCISION Right 1979   axillary, benign  . MELANOMA EXCISION Right 03/2014   elbow  . ORCHIECTOMY Right 2001   followed by radiation  . TONSILLECTOMY  1959  . wide  excision of melanoma on back  1979    Family History  Problem Relation Age of Onset  . Epilepsy Mother   . Esophageal cancer Father         smoker/drinker died at 18  . Breast cancer Sister         diagnosed at 49  . Alcohol abuse Brother   . Colon cancer Neg Hx   . Colon polyps Neg Hx   . Rectal cancer Neg Hx   . Stomach cancer Neg Hx     Allergies  Allergen Reactions  . Prednisone     pancreatitis    Current Outpatient Medications on File Prior to Visit  Medication Sig Dispense Refill  . Multiple Vitamin (MULTIVITAMIN) tablet Take 1 tablet by mouth daily.    . Testosterone 123XX123 MG PLLT 1 application to arm daily.     Current Facility-Administered Medications on File Prior to Visit  Medication Dose Route Frequency Provider Last Rate Last Admin  . 0.9 %  sodium chloride infusion  500 mL Intravenous Once Armbruster, Carlota Raspberry, MD        BP 121/71 (BP Location: Left Arm, Patient Position: Sitting, Cuff Size: Small)   Pulse 70   Temp 98.4 F (36.9 C) (Oral)   Resp 16   Ht 5\' 7"  (1.702 m)   Wt 197 lb (89.4 kg)   SpO2 100%   BMI 30.85 kg/m       Objective:   Physical Exam  Physical Exam  Constitutional: He is oriented to person, place, and time. He appears well-developed and well-nourished. No distress.  HENT:  Head: Normocephalic and atraumatic.  Right Ear: Tympanic membrane and ear canal normal.  Left Ear: Tympanic membrane and ear canal normal.  Mouth/Throat: Not examined Eyes: Pupils are equal, round, and reactive to light. No scleral icterus.  Neck: Normal range of motion. No thyromegaly present.  Cardiovascular: Normal rate and regular rhythm.   No murmur heard. Pulmonary/Chest: Effort normal and breath sounds normal. No respiratory distress. He has no wheezes. He has no rales. He exhibits no tenderness.  Abdominal: Soft. Bowel sounds are normal. He exhibits no distension and no mass. There is no tenderness. There is no rebound and no guarding.    Musculoskeletal: He exhibits no edema.  Lymphadenopathy:    He has no cervical adenopathy.  Neurological: He is alert and oriented to person, place, and time. He has normal patellar reflexes. He exhibits normal muscle tone. Coordination normal.  Skin: Skin is warm and dry. Dry skin is noted Psychiatric: He has a normal mood and affect. His behavior is normal. Judgment and thought content normal.  Assessment & Plan:    Preventative care- discussed healthy diet, exercise. He is doing annual cologard with GI.   Vertigo- resolved. Monitor.  Recurrent Pancreatitis- currently stable/quiet. Monitor.     This visit occurred during the SARS-CoV-2 public health emergency.  Safety protocols were in place, including screening questions prior to the visit, additional usage of staff PPE, and extensive cleaning of exam room while observing appropriate contact time as indicated for disinfecting solutions.    Assessment & Plan:

## 2019-08-12 NOTE — Patient Instructions (Signed)
Please continue to work on healthy diet, exercise and weight loss.  ° °

## 2019-08-17 ENCOUNTER — Ambulatory Visit: Payer: Medicare Other | Attending: Internal Medicine

## 2019-08-17 DIAGNOSIS — Z23 Encounter for immunization: Secondary | ICD-10-CM

## 2019-08-17 NOTE — Progress Notes (Signed)
   Covid-19 Vaccination Clinic  Name:  Stephen Evans    MRN: [id number] DOB: [birth year]  08/17/2019  [name] was observed post Covid-19 immunization for 15 minutes without incidence. He was provided with Vaccine Information Sheet and instruction to access the V-Safe system.   [name] was instructed to call 911 with any severe reactions post vaccine: Marland Kitchen Difficulty breathing  . Swelling of your face and throat  . A fast heartbeat  . A bad rash all over your body  . Dizziness and weakness    Immunizations Administered    Name Date Dose VIS Date Route   Pfizer COVID-19 Vaccine 08/17/2019 12:50 PM 0.3 mL 07/08/2019 Intramuscular   Manufacturer: [place]   Lot: [id number]   [place]: [id number]

## 2019-08-31 DIAGNOSIS — R69 Illness, unspecified: Secondary | ICD-10-CM | POA: Diagnosis not present

## 2019-09-05 ENCOUNTER — Ambulatory Visit: Payer: Medicare HMO | Attending: Internal Medicine

## 2019-09-05 DIAGNOSIS — Z23 Encounter for immunization: Secondary | ICD-10-CM | POA: Insufficient documentation

## 2019-09-05 NOTE — Progress Notes (Signed)
   Covid-19 Vaccination Clinic  Name:  Stephen Evans    MRN: [id number] DOB: 11-13-1945  09/05/2019  [name] was observed post Covid-19 immunization for 15 minutes without incidence. He was provided with Vaccine Information Sheet and instruction to access the V-Safe system.   [name] was instructed to call 911 with any severe reactions post vaccine: Marland Kitchen Difficulty breathing  . Swelling of your face and throat  . A fast heartbeat  . A bad rash all over your body  . Dizziness and weakness    Immunizations Administered    Name Date Dose VIS Date Route   Pfizer COVID-19 Vaccine 09/05/2019  4:38 PM 0.3 mL 07/08/2019 Intramuscular   Manufacturer: [place]   Lot: [id number]   [place]: [id number]

## 2019-11-01 DIAGNOSIS — D3130 Benign neoplasm of unspecified choroid: Secondary | ICD-10-CM | POA: Diagnosis not present

## 2019-11-11 DIAGNOSIS — R69 Illness, unspecified: Secondary | ICD-10-CM | POA: Diagnosis not present

## 2019-11-16 DIAGNOSIS — R69 Illness, unspecified: Secondary | ICD-10-CM | POA: Diagnosis not present

## 2019-11-30 DIAGNOSIS — N401 Enlarged prostate with lower urinary tract symptoms: Secondary | ICD-10-CM | POA: Diagnosis not present

## 2019-12-05 DIAGNOSIS — E291 Testicular hypofunction: Secondary | ICD-10-CM | POA: Diagnosis not present

## 2019-12-05 DIAGNOSIS — N401 Enlarged prostate with lower urinary tract symptoms: Secondary | ICD-10-CM | POA: Diagnosis not present

## 2019-12-05 DIAGNOSIS — R351 Nocturia: Secondary | ICD-10-CM | POA: Diagnosis not present

## 2019-12-05 DIAGNOSIS — N5201 Erectile dysfunction due to arterial insufficiency: Secondary | ICD-10-CM | POA: Diagnosis not present

## 2019-12-06 ENCOUNTER — Ambulatory Visit: Payer: Medicare HMO | Admitting: Gastroenterology

## 2019-12-15 NOTE — Progress Notes (Signed)
Prescreened patient for phone visit tomorrow, 5-21.    CC: He is doing well now but would like to discuss regular pancreatitis flares. His last flare was 2 weeks ago with abdominal pain.

## 2019-12-16 ENCOUNTER — Telehealth (INDEPENDENT_AMBULATORY_CARE_PROVIDER_SITE_OTHER): Payer: Medicare HMO | Admitting: Gastroenterology

## 2019-12-16 VITALS — Ht 67.0 in | Wt 190.0 lb

## 2019-12-16 DIAGNOSIS — K859 Acute pancreatitis without necrosis or infection, unspecified: Secondary | ICD-10-CM

## 2019-12-16 DIAGNOSIS — K8020 Calculus of gallbladder without cholecystitis without obstruction: Secondary | ICD-10-CM | POA: Diagnosis not present

## 2019-12-16 NOTE — Progress Notes (Signed)
THIS ENCOUNTER IS A VIRTUAL VISIT DUE TO COVID-19 - PATIENT WAS NOT SEEN IN THE OFFICE. PATIENT HAS CONSENTED TO VIRTUAL VISIT / TELEMEDICINE VISIT   Location of patient: home Location of provider: office Persons participating: myself, patient Time spent on call:  25 minutes   HPI :  74 year old male here for a follow-up visit for pancreatitis and abdominal pain.  He previously had an episode of pancreatitis in December 2019.  At that time it was thought to potentially be related to alcohol.  He had normal triglycerides and no evidence of gallstones at the time.  He denied any new medications.  He had had some weight loss and some intermittent abdominal pains after that episode and we performed a follow-up CT scan in April 2020.  That showed near complete resolution of his acute pancreatitis without any concerning findings of the pancreas otherwise.  He had had some intermittent epigastric pain that had continued, we proceeded with an upper endoscopy which was done in May 2020.  There is no clear cause for his pain on that exam.  I have not seen him in the past year or so.  He states he is continue to have some episodic abdominal pain. He states that sporadic and intermittent.  He can have episodes of epigastric pain that often bother him at night.  Can last anywhere from 45 minutes to 4 hours.  He states he can break out to a cold sweat when this occurs.  He states it feels like his prior pancreatitis pain when it happens.  He has precursors to the attack where he feels like he belches and burps and feels like he is overeating, and then the pain can start.  Sometimes when he is constipated he feels like he can happen as well.  He denies any routine episodes of pain after he eats, unclear if some of these attacks can follow eating or not.  He previously drank alcohol routinely, he states he is cut back to about 5% of what he previously drank, he will have 1 drink every week to every 4 weeks,  varies.  He denies any family history of pancreatic cancer or pancreatitis.  He does not smoke tobacco, he previously smoked and quit in the 1990s.  He has dropped his weight about 195, has lost about 35 pounds over the past few years since this is started.  He states he eats very healthy but is not trying to lose weight.  His father had esophageal cancer around age 33.  This past October he had a severe episode of pain and was seen in the emergency department.  His lipase was 3949 with normal liver enzymes.  The emergency department performed a right upper quadrant ultrasound which showed gallstones at the time.  He was not admitted and apparently discharged home, states he eventually recovered.  He had a follow-up lipase that normalized after that visit.  He has had multiple lab draws with normal liver enzymes.  Interestingly on 2 ultrasounds in 2019 he had no gallstones at all.  Prior workup: Cologuard negative 10/27/2018   [place] RUQ 05/04/19 - IMPRESSION: Cholelithiasis without evidence of acute cholecystitis.  CT scan abdomen / pelvis with contrast 11/17/18 - normal pancreas, no inflammatory changes of the pancreas, some mild residual inflammatory changes in small bowel mesentery, diverticulosis,   US abdomen 05/26/18 - no gallstones, CBD normal, liver normal  CT abdomen 07/20/18 - pancreatitis, small liver cyst US abdomen 07/11/18 - no gallstones, normal CBD,  mild perihepatic ascites  Last colonoscopy 10/2003 - normal exam without polyps  EGD 12/08/2018 -  Findings: - A 1 cm hiatal hernia was present. - Mucosal changes characterized by erythema with slight nodularity were found at the gastroesophageal junction fromthe 6 o'clock to 9 o'clock position. Suspect benign inflammatory change from reflux but biopsies were taken with a cold forceps for histology, ensure no Barrett's / dysplasia. - The exam of the esophagus was otherwise normal. - A few small sessile polyps were found in the  gastric fundus and in the gastric body, 3-71mm in size. Biopsies were taken with a cold forceps for histology from a few of these. - The exam of the stomach was otherwise normal. - Biopsies were taken with a cold forceps in the gastric body, at the incisura and in the gastric antrum for Helicobacter pylori testing. - Erythematous mucosa was found in the duodenal bulb without focal ulceration. Biopsies were taken with a cold forceps for histology. - The exam of the duodenum was otherwise normal.  Diagnosis 1. Surgical [P], duodenal - PEPTIC DUODENITIS. - NO DYSPLASIA OR MALIGNANCY. 2. Surgical [P], gastric antrum and gastric body - MILD REACTIVE CHANGES. - WARTHIN-STARRY IS NEGATIVE FOR HELICOBACTER PYLORI. - NO INTESTINAL METAPLASIA, DYSPLASIA, OR MALIGNANCY. 3. Surgical [P], gastric polyps biopsies - FUNDIC GLAND POLYP. - WARTHIN-STARRY IS NEGATIVE FOR HELICOBACTER PYLORI. - NO INTESTINAL METAPLASIA, DYSPLASIA, OR MALIGNANCY. 4. Surgical [P], esophagus, GE junction - REFLUX CHANGES. - NO INTESTINAL METAPLASIA, DYSPLASIA, OR MALIGNANCY IDENTIFIED.    Past Medical History:  Diagnosis Date  . Melanoma ([place]) x 2  . Melanoma ([place])   . Melanoma ([place])   . Pancreatitis 06/2018   thought to be due to alcohol  . Testicular cancer ([place]) 2011   testicular--radiation  . Testicular tumor      Past Surgical History:  Procedure Laterality Date  . COLONOSCOPY  11/01/2003  . LYMPH GLAND EXCISION Right 1979   axillary, benign  . MELANOMA EXCISION Right 03/2014   elbow  . ORCHIECTOMY Right 2001   followed by radiation  . TONSILLECTOMY  1959  . wide excision of melanoma on back  1979   Family History  Problem Relation Age of Onset  . Epilepsy Mother   . Esophageal cancer Father         smoker/drinker died at 56  . Breast cancer Sister         diagnosed at 46  . Alcohol abuse Brother   . Colon cancer Neg Hx   . Colon polyps Neg Hx   . Rectal cancer Neg Hx   . Stomach cancer  Neg Hx   . Pancreatic cancer Neg Hx    Social History   Tobacco Use  . Smoking status: Former Smoker    Years: 30.00    Types: Cigarettes    Quit date: 09/06/1997    Years since quitting: 22.2  . Smokeless tobacco: Never Used  Substance Use Topics  . Alcohol use: Yes    Comment: 1 every 2 weeks  . Drug use: No   Current Outpatient Medications  Medication Sig Dispense Refill  . Multiple Vitamin (MULTIVITAMIN) tablet Take 1 tablet by mouth daily.    . Testosterone 123XX123 MG PLLT 1 application to arm daily.    Mar[place] Kitchen Zoster Vaccine Adjuvanted [facility]) injection Inject 0.5mg  IM now and again in 2-6 months. 0.5 mL 1   No current facility-administered medications for this visit.   Allergies  Allergen Reactions  . Prednisone  pancreatitis     Review of Systems: All systems reviewed and negative except where noted in HPI.   Lab Results  Component Value Date   WBC 6.5 05/13/2019   HGB 14.6 05/13/2019   HCT 44.0 05/13/2019   MCV 91.1 05/13/2019   PLT 272.0 05/13/2019    Lab Results  Component Value Date   CREATININE 0.77 05/13/2019   BUN 17 05/13/2019   NA 141 05/13/2019   K 4.2 05/13/2019   CL 105 05/13/2019   CO2 27 05/13/2019    Lab Results  Component Value Date   ALT 20 05/13/2019   AST 11 05/13/2019   ALKPHOS 79 05/13/2019   BILITOT 0.5 05/13/2019     Physical Exam: Ht 5\' 7"  (1.702 m)   Wt 190 lb (86.2 kg)   BMI 29.76 kg/m  No exam done - phone visit only   ASSESSMENT AND PLAN: 74 year old male here for reassessment of the following:  Recurrent pancreatitis / gallstones - initial hospitalization with pancreatitis in 2019 thought to be due to possibly alcohol at the time.  He has significantly cut back on his alcohol use, no routine use, yet continues to have intermittent epigastric pains that appear sporadic, not always related to eating.  He had another clear-cut attack of pancreatitis given marked elevation in lipase this past October which I was not  aware of until now.  His LFTs were normal however ultrasound did show gallstones which were seen for the first time.  Unclear if the gallstones are a red herring here, he does not have typical biliary colic symptoms, however it is unclear what is causing his recurrent pancreatitis. Trigs are normal. No new medications - he was taking prednisone at the time of his last episode but that is not a common culprit. Biliary pancreatitis seems less likely with normal liver enzymes however with gallstones on US imaging, one could consider performing empiric cholecystectomy.  I discussed options with him.  Given he has had recurrent pancreatitis and these other intermittent " attacks" of epigastric pain - unclear if those episodes are related to his pancreas, gallstones or unrelated - I am going to recommend an MRCP to reevaluate his pancreas, ensure patent ducts, no cysts or mass lesions, rule out evidence of chronic pancreatitis.  He was agreeable to this following discussion of it. Further recommendations pending that result. If the exam is entirely normal, we may consider referral to the surgeons to discuss empiric cholecystectomy, while understanding that this may or may not help his symptoms.  Otherwise, if he has an episode of pain again I asked him to contact me and would like to recheck his lipase and LFTs when this occurs to see if lipase rises in the setting of these recurrent symptoms.  He agreed with the plan, all questions answered, will await MRI with further recommendations  Bushnell Cellar, MD [facility]

## 2019-12-16 NOTE — Patient Instructions (Signed)
If you are age 72 or older, your body mass index should be between 23-30. Your Body mass index is 29.76 kg/m. If this is out of the aforementioned range listed, please consider follow up with your Primary Care Provider.  If you are age 46 or younger, your body mass index should be between 19-25. Your Body mass index is 29.76 kg/m. If this is out of the aformentioned range listed, please consider follow up with your Primary Care Provider.   You have been scheduled for an MRI at [facility]  on 12/20/19. Your appointment time is 9:00am . Please arrive 30 minutes prior to your appointment time for registration purposes. Please make certain not to have anything to eat or drink after midnight the night before your test. In addition, if you have any metal in your body, have a pacemaker or defibrillator, please be sure to let your ordering physician know. This test typically takes 45 minutes to 1 hour to complete. Should you need to reschedule, please call [phone] to do so.  Due to recent changes in healthcare laws, you may see the results of your imaging and laboratory studies on MyChart before your provider has had a chance to review them.  We understand that in some cases there may be results that are confusing or concerning to you. Not all laboratory results come back in the same time frame and the provider may be waiting for multiple results in order to interpret others.  Please give [place] 48 hours in order for your provider to thoroughly review all the results before contacting the office for clarification of your results.   Thank you for choosing me and [facility].  Dr.Armbruster

## 2019-12-20 ENCOUNTER — Telehealth: Payer: Self-pay

## 2019-12-20 ENCOUNTER — Other Ambulatory Visit: Payer: Self-pay | Admitting: Gastroenterology

## 2019-12-20 ENCOUNTER — Other Ambulatory Visit: Payer: Self-pay

## 2019-12-20 ENCOUNTER — Encounter (HOSPITAL_COMMUNITY)
Admission: RE | Admit: 2019-12-20 | Discharge: 2019-12-20 | Disposition: A | Discharge status: Home / Self Care | Payer: Medicare HMO | Source: Ambulatory Visit | Attending: Gastroenterology | Admitting: Gastroenterology

## 2019-12-20 DIAGNOSIS — K8020 Calculus of gallbladder without cholecystitis without obstruction: Secondary | ICD-10-CM

## 2019-12-20 DIAGNOSIS — K859 Acute pancreatitis without necrosis or infection, unspecified: Secondary | ICD-10-CM

## 2019-12-20 DIAGNOSIS — K861 Other chronic pancreatitis: Secondary | ICD-10-CM | POA: Diagnosis not present

## 2019-12-20 DIAGNOSIS — R935 Abnormal findings on diagnostic imaging of other abdominal regions, including retroperitoneum: Secondary | ICD-10-CM | POA: Diagnosis not present

## 2019-12-20 MED ORDER — GADOBUTROL 1 MMOL/ML IV SOLN
10.0000 mL | Freq: Once | INTRAVENOUS | Status: AC | PRN
  Administered 2019-12-20: 10 mL via INTRAVENOUS

## 2019-12-20 NOTE — Telephone Encounter (Signed)
-----   Message from [name], MD sent at 12/20/2019 12:52 PM EDT ----- Regarding: RE: possible EUS Stephen Evans I think endoscopic ultrasound evaluation is probably a good next step.  If nothing concerning is found then he probably needs his gallbladder removed given the recurrent pancreatitis and now we know he has gallstones.   Lainee Lehrman he needs endoscopic ultrasound, Gabe or myself, first available appointment.  For recurrent pancreatitis, cyst in pancreas.  Wynetta Fines  Thanks ----- Message ----- From: [name], MD Sent: 12/20/2019  12:32 PM EDT To: [name], MD, # Subject: possible EUS                                   Hey guys, had a question about this patient / MRCP findings. He's had recurrent pancreatitis. First episode thought to be due to EtOH but he doesn't drink very much and had a recurrent clear episode of pancreatitis in October, and has been having intermittent epigastric pain since then (although not classic for biliary colic). [place] recently showed gallstones (did not previously) but his LFTs have been normal in setting of most recent episode of pancreatitis.  I did a follow up MRCP which shows a 2.5cm cystic lesion in uncinate process, possible IPMN with mild downstream ductal dilation. Not listed in prior CT report but apparently seen at that time and previously lesion was 1.7cm. Was curious if you thought this patient would benefit from EUS given increasing size of the lesion and his symptoms. Thanks  Stephen Evans

## 2019-12-20 NOTE — Telephone Encounter (Signed)
I called the patient - see result note for details

## 2019-12-20 NOTE — Telephone Encounter (Signed)
[facility] called to make sure you review the results of pt's MRCP done today:   1. Cystic lesion in the uncinate of the pancreas with mild downstream ductal dilatation suggest a intraductal papillary mucinous tumor. Potential growth from prior CT (differing technique). Recommend follow-up MRI in 6 months. This recommendation follows ACR consensus guidelines: Management of Incidental Pancreatic Cysts: A White Paper of the ACR Incidental Findings Committee. [place] [id number].

## 2019-12-20 NOTE — Telephone Encounter (Signed)
[name], MD  [name], MD; Mansouraty, [name], MD; [name], RN  Great thanks Linna Hoff.   Stephen Evans I will call this patient today and let him know, then get back to you for scheduling. Thanks   Richardson Landry

## 2019-12-21 ENCOUNTER — Other Ambulatory Visit: Payer: Self-pay

## 2019-12-21 DIAGNOSIS — K859 Acute pancreatitis without necrosis or infection, unspecified: Secondary | ICD-10-CM

## 2019-12-21 NOTE — Telephone Encounter (Signed)
EUS scheduled, pt instructed and medications reviewed.  Patient instructions mailed to home.  Patient to call with any questions or concerns. Covid instructions also provided and information sent to My Chart.  Confirmed that the pt can review My Chart

## 2019-12-21 NOTE — Telephone Encounter (Signed)
Armbruster, Carlota Raspberry, MD  [name], RN  Called the patient and discussed the results of his MRCP with him and his wife. He has a cystic lesion in the uncinate process measuring about 2.5 cm in size with some mild downstream ductal dilation, concerning for possible IPMN. He has had recurrent pancreatitis unclear if this is related. Apparently this is visualized on the prior CT scan and appears slightly increased in size over time. I have discussed this case with my advanced endoscopy colleagues and we agree that EUS is reasonable to further evaluate this lesion in light of the patient's recurrent pancreatitis. I discussed the patient discussed what EUS is, risks and benefits of that and anesthesia and he want to proceed. If he has another attack of symptoms in the interim I asked him to contact me and we will check his lipase and LFTs.    Ehren Berisha can you please contact this patient for scheduling of EUS with Dr. Ardis Hughs or Dr. Rush Landmark? Thanks. You should see an Epic message from them about this case. thanks

## 2020-01-04 DIAGNOSIS — R69 Illness, unspecified: Secondary | ICD-10-CM | POA: Diagnosis not present

## 2020-01-14 ENCOUNTER — Other Ambulatory Visit (HOSPITAL_COMMUNITY)
Admission: RE | Admit: 2020-01-14 | Discharge: 2020-01-14 | Disposition: A | Discharge status: Home / Self Care | Payer: Medicare HMO | Source: Ambulatory Visit | Attending: Gastroenterology | Admitting: Gastroenterology

## 2020-01-14 DIAGNOSIS — Z20822 Contact with and (suspected) exposure to covid-19: Secondary | ICD-10-CM | POA: Insufficient documentation

## 2020-01-14 DIAGNOSIS — Z01812 Encounter for preprocedural laboratory examination: Secondary | ICD-10-CM | POA: Insufficient documentation

## 2020-01-14 LAB — SARS CORONAVIRUS 2 (TAT 6-24 HRS): SARS Coronavirus 2: NEGATIVE

## 2020-01-18 ENCOUNTER — Other Ambulatory Visit: Payer: Self-pay

## 2020-01-18 ENCOUNTER — Ambulatory Visit (HOSPITAL_COMMUNITY): Payer: Medicare HMO | Admitting: Anesthesiology

## 2020-01-18 ENCOUNTER — Encounter (HOSPITAL_COMMUNITY): Payer: Self-pay | Admitting: Gastroenterology

## 2020-01-18 ENCOUNTER — Ambulatory Visit (HOSPITAL_COMMUNITY)
Admission: RE | Admit: 2020-01-18 | Discharge: 2020-01-18 | Disposition: A | Discharge status: Home / Self Care | Payer: Medicare HMO | Attending: Gastroenterology | Admitting: Gastroenterology

## 2020-01-18 ENCOUNTER — Encounter (HOSPITAL_COMMUNITY)
Admission: RE | Disposition: A | Discharge status: Home / Self Care | Payer: Self-pay | Source: Home / Self Care | Attending: Gastroenterology

## 2020-01-18 DIAGNOSIS — K209 Esophagitis, unspecified without bleeding: Secondary | ICD-10-CM | POA: Insufficient documentation

## 2020-01-18 DIAGNOSIS — R932 Abnormal findings on diagnostic imaging of liver and biliary tract: Secondary | ICD-10-CM | POA: Insufficient documentation

## 2020-01-18 DIAGNOSIS — K2970 Gastritis, unspecified, without bleeding: Secondary | ICD-10-CM | POA: Diagnosis not present

## 2020-01-18 DIAGNOSIS — Z85820 Personal history of malignant melanoma of skin: Secondary | ICD-10-CM | POA: Diagnosis not present

## 2020-01-18 DIAGNOSIS — K859 Acute pancreatitis without necrosis or infection, unspecified: Secondary | ICD-10-CM | POA: Insufficient documentation

## 2020-01-18 DIAGNOSIS — K8689 Other specified diseases of pancreas: Secondary | ICD-10-CM | POA: Diagnosis present

## 2020-01-18 DIAGNOSIS — K3189 Other diseases of stomach and duodenum: Secondary | ICD-10-CM | POA: Diagnosis not present

## 2020-01-18 DIAGNOSIS — Z888 Allergy status to other drugs, medicaments and biological substances status: Secondary | ICD-10-CM | POA: Insufficient documentation

## 2020-01-18 DIAGNOSIS — K862 Cyst of pancreas: Secondary | ICD-10-CM

## 2020-01-18 DIAGNOSIS — K2980 Duodenitis without bleeding: Secondary | ICD-10-CM | POA: Insufficient documentation

## 2020-01-18 DIAGNOSIS — K8020 Calculus of gallbladder without cholecystitis without obstruction: Secondary | ICD-10-CM | POA: Diagnosis not present

## 2020-01-18 DIAGNOSIS — K869 Disease of pancreas, unspecified: Secondary | ICD-10-CM | POA: Diagnosis not present

## 2020-01-18 DIAGNOSIS — Z87891 Personal history of nicotine dependence: Secondary | ICD-10-CM | POA: Diagnosis not present

## 2020-01-18 DIAGNOSIS — I899 Noninfective disorder of lymphatic vessels and lymph nodes, unspecified: Secondary | ICD-10-CM | POA: Diagnosis not present

## 2020-01-18 HISTORY — PX: ESOPHAGOGASTRODUODENOSCOPY (EGD) WITH PROPOFOL: SHX5813

## 2020-01-18 HISTORY — PX: EUS: SHX5427

## 2020-01-18 HISTORY — PX: FINE NEEDLE ASPIRATION: SHX5430

## 2020-01-18 HISTORY — PX: BIOPSY: SHX5522

## 2020-01-18 SURGERY — ESOPHAGOGASTRODUODENOSCOPY (EGD) WITH PROPOFOL
Anesthesia: Monitor Anesthesia Care

## 2020-01-18 MED ORDER — PROPOFOL 10 MG/ML IV BOLUS
INTRAVENOUS | Status: AC
  Filled 2020-01-18: qty 20

## 2020-01-18 MED ORDER — PROPOFOL 500 MG/50ML IV EMUL
INTRAVENOUS | Status: AC
  Filled 2020-01-18: qty 100

## 2020-01-18 MED ORDER — CIPROFLOXACIN HCL 500 MG PO TABS: 500.0000 mg | ORAL_TABLET | Freq: Two times a day (BID) | ORAL | 0 refills | Status: AC

## 2020-01-18 MED ORDER — CIPROFLOXACIN IN D5W 400 MG/200ML IV SOLN
INTRAVENOUS | Status: DC | PRN
Start: 2020-01-18 — End: 2020-01-18
  Administered 2020-01-18: 400 mg via INTRAVENOUS

## 2020-01-18 MED ORDER — LACTATED RINGERS IV SOLN
INTRAVENOUS | Status: AC | PRN
  Administered 2020-01-18: 1000 mL via INTRAVENOUS

## 2020-01-18 MED ORDER — CIPROFLOXACIN IN D5W 400 MG/200ML IV SOLN
INTRAVENOUS | Status: AC
  Filled 2020-01-18: qty 200

## 2020-01-18 MED ORDER — SODIUM CHLORIDE 0.9 % IV SOLN: INTRAVENOUS | Status: DC

## 2020-01-18 MED ORDER — PROPOFOL 500 MG/50ML IV EMUL
INTRAVENOUS | Status: DC | PRN
  Administered 2020-01-18: 150 ug/kg/min via INTRAVENOUS

## 2020-01-18 MED ORDER — OMEPRAZOLE 40 MG PO CPDR: 40.0000 mg | DELAYED_RELEASE_CAPSULE | Freq: Every day | ORAL | 3 refills | Status: DC

## 2020-01-18 SURGICAL SUPPLY — 14 items

## 2020-01-18 NOTE — Transfer of Care (Signed)
Immediate Anesthesia Transfer of Care Note  Patient: Stephen Evans  Procedure(s) Performed: ESOPHAGOGASTRODUODENOSCOPY (EGD) WITH PROPOFOL (N/A ) UPPER ENDOSCOPIC ULTRASOUND (EUS) LINEAR (N/A ) FINE NEEDLE ASPIRATION (FNA) LINEAR (N/A ) BIOPSY  Patient Location: PACU  Anesthesia Type:MAC  Level of Consciousness: sedated, patient cooperative and responds to stimulation  Airway & Oxygen Therapy: Patient Spontanous Breathing and Patient connected to face mask oxygen  Post-op Assessment: Report given to RN and Post -op Vital signs reviewed and stable  Post vital signs: Reviewed and stable  Last Vitals:  Vitals Value Taken Time  BP 127/64 01/18/20 1041  Temp    Pulse 54 01/18/20 1042  Resp 11 01/18/20 1042  SpO2 100 % 01/18/20 1042  Vitals shown include unvalidated device data.  Last Pain:  Vitals:   01/18/20 0741  TempSrc: Oral  PainSc: 0-No pain         Complications: No complications documented.

## 2020-01-18 NOTE — Anesthesia Postprocedure Evaluation (Signed)
Anesthesia Post Note  Patient: Stephen Evans  Procedure(s) Performed: ESOPHAGOGASTRODUODENOSCOPY (EGD) WITH PROPOFOL (N/A ) UPPER ENDOSCOPIC ULTRASOUND (EUS) LINEAR (N/A ) FINE NEEDLE ASPIRATION (FNA) LINEAR (N/A ) BIOPSY     Patient location during evaluation: Endoscopy Anesthesia Type: MAC Level of consciousness: awake and alert Pain management: pain level controlled Vital Signs Assessment: post-procedure vital signs reviewed and stable Respiratory status: spontaneous breathing, nonlabored ventilation and respiratory function stable Cardiovascular status: blood pressure returned to baseline and stable Postop Assessment: no apparent nausea or vomiting Anesthetic complications: no   No complications documented.  Last Vitals:  Vitals:   01/18/20 1050 01/18/20 1105  BP: 138/73 (!) 134/59  Pulse: (!) 51   Resp: 12 14  Temp:    SpO2: 100% 100%    Last Pain:  Vitals:   01/18/20 1105  TempSrc:   PainSc: 0-No pain                 Lidia Collum

## 2020-01-18 NOTE — Discharge Instructions (Signed)
YOU HAD AN ENDOSCOPIC PROCEDURE TODAY: Refer to the procedure report and other information in the discharge instructions given to you for any specific questions about what was found during the examination. If this information does not answer your questions, please call [place] office at [phone] to clarify.   YOU SHOULD EXPECT: Some feelings of bloating in the abdomen. Passage of more gas than usual. Walking can help get rid of the air that was put into your GI tract during the procedure and reduce the bloating. If you had a lower endoscopy (such as a colonoscopy or flexible sigmoidoscopy) you may notice spotting of blood in your stool or on the toilet paper. Some abdominal soreness may be present for a day or two, also.  DIET: Your first meal following the procedure should be a light meal and then it is ok to progress to your normal diet. A half-sandwich or bowl of soup is an example of a good first meal. Heavy or fried foods are harder to digest and may make you feel nauseous or bloated. Drink plenty of fluids but you should avoid alcoholic beverages for 24 hours. If you had a esophageal dilation, please see attached instructions for diet.    ACTIVITY: Your care partner should take you home directly after the procedure. You should plan to take it easy, moving slowly for the rest of the day. You can resume normal activity the day after the procedure however YOU SHOULD NOT DRIVE, use power tools, machinery or perform tasks that involve climbing or major physical exertion for 24 hours (because of the sedation medicines used during the test).   SYMPTOMS TO REPORT IMMEDIATELY: A gastroenterologist can be reached at any hour. Please call [phone]  for any of the following symptoms:   Following upper endoscopy (EGD, EUS, ERCP, esophageal dilation) Vomiting of blood or coffee ground material  New, significant abdominal pain  New, significant chest pain or pain under the shoulder blades  Painful or  persistently difficult swallowing  New shortness of breath  Black, tarry-looking or red, bloody stools  FOLLOW UP:  If any biopsies were taken you will be contacted by phone or by letter within the next 1-3 weeks. Call [phone]  if you have not heard about the biopsies in 3 weeks.  Please also call with any specific questions about appointments or follow up tests.  

## 2020-01-18 NOTE — Anesthesia Preprocedure Evaluation (Signed)
Anesthesia Evaluation  Patient identified by MRN, date of birth, ID band Patient awake    Reviewed: Allergy & Precautions, NPO status , Patient's Chart, lab work & pertinent test results  History of Anesthesia Complications Negative for: history of anesthetic complications  Airway Mallampati: II  TM Distance: >3 FB Neck ROM: Full    Dental   Pulmonary neg pulmonary ROS, former smoker,    Pulmonary exam normal        Cardiovascular negative cardio ROS Normal cardiovascular exam     Neuro/Psych negative neurological ROS  negative psych ROS   GI/Hepatic Neg liver ROS, pancreatitis   Endo/Other  negative endocrine ROS  Renal/GU negative Renal ROS  negative genitourinary   Musculoskeletal negative musculoskeletal ROS (+)   Abdominal   Peds  Hematology negative hematology ROS (+)   Anesthesia Other Findings   Reproductive/Obstetrics                           Anesthesia Physical Anesthesia Plan  ASA: II  Anesthesia Plan: MAC   Post-op Pain Management:    Induction: Intravenous  PONV Risk Score and Plan: 1 and Propofol infusion, TIVA and Treatment may vary due to age or medical condition  Airway Management Planned: Natural Airway, Nasal Cannula and Simple Face Mask  Additional Equipment: None  Intra-op Plan:   Post-operative Plan: Extubation in OR  Informed Consent: I have reviewed the patients History and Physical, chart, labs and discussed the procedure including the risks, benefits and alternatives for the proposed anesthesia with the patient or authorized representative who has indicated his/her understanding and acceptance.       Plan Discussed with:   Anesthesia Plan Comments:       Anesthesia Quick Evaluation

## 2020-01-18 NOTE — Op Note (Signed)
[facility] Patient Name: Stephen Evans Procedure Date: 01/18/2020 MRN: [id number] Attending MD: Justice Britain , MD Date of Birth: 01-17-1946 CSN: [id number] Age: 74 Admit Type: Outpatient Procedure:                Upper EUS Indications:              Dilated pancreatic duct on CT scan, Pancreatic cyst                            on CT scan, Dilated pancreatic duct on MRCP,                            Abnormal MRCP, Acute recurrent pancreatitis Providers:                Justice Britain, MD, Cleda Daub, RN, Laverda Sorenson, Technician, Tyrone Apple, Technician,                            Herbie Drape, CRNA Referring MD:             Carlota Raspberry. Havery Moros, MD Medicines:                Monitored Anesthesia Care Complications:            No immediate complications. Estimated Blood Loss:     Estimated blood loss was minimal. Procedure:                Pre-Anesthesia Assessment:                           - Prior to the procedure, a History and Physical                            was performed, and patient medications and                            allergies were reviewed. The patient's tolerance of                            previous anesthesia was also reviewed. The risks                            and benefits of the procedure and the sedation                            options and risks were discussed with the patient.                            All questions were answered, and informed consent                            was obtained. Prior Anticoagulants: The patient has  taken no previous anticoagulant or antiplatelet                            agents. ASA Grade Assessment: II - A patient with                            mild systemic disease. After reviewing the risks                            and benefits, the patient was deemed in                            satisfactory condition to undergo the procedure.                            After obtaining informed consent, the endoscope was                            passed under direct vision. Throughout the                            procedure, the patient's blood pressure, pulse, and                            oxygen saturations were monitored continuously. The                            [id number]-H190 ([id number]) was introduced through the                            mouth, and advanced to the second part of duodenum.                            The [id number]-[id number] ([id number]) Olympus Duodenoscope was                            introduced through the mouth, and advanced to the                            second part of duodenum. The [id number] ([id number])                            Olympus Linear EUS was introduced through the                            mouth, and advanced to the duodenum for ultrasound                            examination from the stomach and duodenum. The                            upper EUS was accomplished without difficulty. The  patient tolerated the procedure. Scope In: Scope Out: Findings:      ENDOSCOPIC FINDING: :      LA Grade A (one or more mucosal breaks less than 5 mm, not extending       between tops of 2 mucosal folds) esophagitis with no bleeding was found       at the gastroesophageal junction. Z--line at 40 cm.      No other gross lesions were noted in the entire esophagus.      Patchy mild inflammation characterized by erosions and erythema was       found in the gastric body, at the incisura and in the gastric antrum.       Biopsies were taken with a cold forceps for histology and Helicobacter       pylori testing.      Segmental moderate inflammation characterized by congestion (edema) and       erythema was found in the duodenal bulb, in the first portion of the       duodenum, in the second portion of the duodenum, in the area of the       papilla and in the area of the minor papilla.       ENDOSONOGRAPHIC FINDING: :      The pancreatic duct had a dilated endosonographic appearance in the       uncinate process of the pancreas. Following the pancreatic duct in the       area of the uncinate it measured up to 5 mm -> 6.1 mm -> 8.0 mm in       diameter. There was concern at the main area of dilation of an       intrapapillary lesion/mural nodularity concerning for potential more       invasive IPMN physiology. It measured 14 mm x 10 mm. Fine needle biopsy       was performed of this area. Color Doppler imaging was utilized prior to       needle puncture to confirm a lack of significant vascular structures       within the needle path. Six passes were made with the 25 gauge       ultrasound biopsy needle using a transduodenal approach. A visible core       of tissue was obtained. Preliminary cytologic examination and touch       preps were performed. Final cytology results are pending. I attempted to       gain fluid from the dilated uncinate process duct to send for       CEA/Amylase but was not able to have any pull back of fluid on the 25       guage needle. Previous MRICP had suggested cystic region in the       uncinate, and this is the only area that I could visualize due to the       angulation and short duodenal 2 region.      The main pancreatic duct had a regular endosonographic appearance in the       genu of the pancreas (2.2 mm), body of the pancreas (0.8 mm) and tail of       the pancreas (0.9 mm). There was possiblity of underlying connection       from the uncinate process PD into the main PD over the CBD region. This       is concerning for the possibility of an incomplete pancreatic divisum. I  did not visualize a secondary orifice for pancreatic duct insertion in       the duodenum as had been suggested.      Pancreatic parenchymal abnormalities were noted in the entire pancreas.       These consisted of atrophy, hyperechoic foci without shadowing,        lobularity without honeycombing and hyperechoic strands.      Multiple stones and gallbladder sludge were visualized       endosonographically in the gallbladder body. The stones were round. They       were hyperechoic.      There was no sign of significant endosonographic abnormality in the       common bile duct and in the common hepatic duct. Ducts with regular       contour were identified.      Endosonographic imaging of the ampulla showed no intramural       (subepithelial) lesion.      Endosonographic imaging in the visualized portion of the liver showed a       small hepatic cyst but no other abnormalities.      No malignant-appearing lymph nodes were visualized in the celiac region       (level 20), peripancreatic region and porta hepatis region.      The celiac region was visualized. Impression:               EGD Impression:                           - LA Grade A esophagitis with no bleeding distally.                           - Gastritis. Biopsied.                           - Duodenitis.                           EUS Impression:                           - The pancreatic duct in the Uncinate process had a                            dilated endosonographic appearance and had findings                            concerning for intramural nodularity. Fine needle                            biopsy performed of the mural nodularity.                           - Pancreatic parenchymal abnormalities consisting                            of atrophy, hyperechoic foci without shadowing,                            lobularity and hyperechoic strands were noted  in                            the entire pancreas. Based on [place] criteria the                            findings are suggestive of Chronic Pancreatitis but                            not daignostic.                           - The main pancreatic duct had a regular                            endosonographic appearance in the genu  of the                            pancreas, body of the pancreas and tail of the                            pancreas. The pancreatic duct in the head connected                            to a region of the accessory and was suggestive of                            possible incomplete pancreatic divisum.                           - Multiple stones were visualized                            endosonographically in the gallbladder body as well                            as gallbladder sludge.                           - There was no sign of significant pathology in the                            common bile duct and in the common hepatic duct.                           - No malignant-appearing lymph nodes were                            visualized in the celiac region (level 20),                            peripancreatic region and porta hepatis region. Moderate Sedation:      Not Applicable - Patient had care per Anesthesia. Recommendation:           -  The patient will be observed post-procedure,                            until all discharge criteria are met.                           - Discharge patient to home.                           - Patient has a contact number available for                            emergencies. The signs and symptoms of potential                            delayed complications were discussed with the                            patient. Return to normal activities tomorrow.                            Written discharge instructions were provided to the                            patient.                           - Low fat diet.                           - Observe patient's clinical course.                           - Monitor for signs/symptoms of bleeding,                            perforation, pancreatitis and infection. If issues                            please call our number to get further assistance as                            needed.                            - Await cytology results and await path results.                           - Would send Fecal elastase in future to evaluate                            for pancreatic insufficiency.                           - Start Omeprazole 40 mg daily.                           -  Ciprofloxacin 500 mg BID x 5-days to decrease                            risk of post-EUS infectious complications.                           - May need repeat MRICP.                           - Likely needs discussion at [facility] in the coming                            weeks.                           - The findings and recommendations were discussed                            with the patient.                           - The findings and recommendations were discussed                            with the patient's family. Procedure Code(s):        --- Professional ---                           [phone], Esophagogastroduodenoscopy, flexible,                            transoral; with transendoscopic ultrasound-guided                            intramural or transmural fine needle                            aspiration/biopsy(s), (includes endoscopic                            ultrasound examination limited to the esophagus,                            stomach or duodenum, and adjacent structures) Diagnosis Code(s):        --- Professional ---                           K20.90, Esophagitis, unspecified without bleeding                           K29.70, Gastritis, unspecified, without bleeding                           K29.80, Duodenitis without bleeding                           K86.9, Disease of pancreas, unspecified  K80.20, Calculus of gallbladder without                            cholecystitis without obstruction                           I89.9, Noninfective disorder of lymphatic vessels                            and lymph nodes, unspecified                           K86.89, Other specified  diseases of pancreas                           K86.2, Cyst of pancreas                           K85.90, Acute pancreatitis without necrosis or                            infection, unspecified                           R93.3, Abnormal findings on diagnostic imaging of                            other parts of digestive tract                           R93.2, Abnormal findings on diagnostic imaging of                            liver and biliary tract CPT copyright 2019 American Medical Association. All rights reserved. The codes documented in this report are preliminary and upon coder review may  be revised to meet current compliance requirements. Justice Britain, MD 01/18/2020 11:14:09 AM Number of Addenda: 0

## 2020-01-18 NOTE — H&P (Signed)
GASTROENTEROLOGY PROCEDURE H&P NOTE   Primary Care Physician: [name], NP  HPI: Stephen Evans is a 74 y.o. male who presents for EGD/EUS for evaluation of recurrent pancreatitis and as well as abnormalities and cysts in the pancreas.  Past Medical History:  Diagnosis Date  . Melanoma ([place]) x 2  . Melanoma ([place])   . Melanoma ([place])   . Pancreatitis 06/2018   thought to be due to alcohol  . Testicular cancer ([place]) 2011   testicular--radiation  . Testicular tumor    Past Surgical History:  Procedure Laterality Date  . COLONOSCOPY  11/01/2003  . LYMPH GLAND EXCISION Right 1979   axillary, benign  . MELANOMA EXCISION Right 03/2014   elbow  . ORCHIECTOMY Right 2001   followed by radiation  . TONSILLECTOMY  1959  . wide excision of melanoma on back  1979   No current facility-administered medications for this encounter.   Allergies  Allergen Reactions  . Prednisone     pancreatitis   Family History  Problem Relation Age of Onset  . Epilepsy Mother   . Esophageal cancer Father         smoker/drinker died at 53  . Breast cancer Sister         diagnosed at 68  . Alcohol abuse Brother   . Colon cancer Neg Hx   . Colon polyps Neg Hx   . Rectal cancer Neg Hx   . Stomach cancer Neg Hx   . Pancreatic cancer Neg Hx    Social History   Socioeconomic History  . Marital status: Married    Spouse name: Not on file  . Number of children: 2  . Years of education: Not on file  . Highest education level: Not on file  Occupational History  . Occupation: retired    Comment: Arts development officer  Tobacco Use  . Smoking status: Former Smoker    Years: 30.00    Types: Cigarettes    Quit date: 09/06/1997    Years since quitting: 22.3  . Smokeless tobacco: Never Used  Vaping Use  . Vaping Use: Never used  Substance and Sexual Activity  . Alcohol use: Yes    Comment: 1 every 2 weeks  . Drug use: No  . Sexual activity: Not on file  Other Topics Concern  . Not  on file  Social History Narrative   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[employer]   Married- second marriage x 20 yrs   66 yr old son- lives in [place], [place] manages a private hedge fund   9 yr old daughter- lives in [place], [place]   Enjoys golf   Social Determinants of Health   Financial Resource Strain:   . Difficulty of Paying Living Expenses:   Food Insecurity:   . Worried About Charity fundraiser in the Last Year:   . Arboriculturist in the Last Year:   Transportation Needs:   . Film/video editor (Medical):   Marland Kitchen Lack of Transportation (Non-Medical):   Physical Activity:   . Days of Exercise per Week:   . Minutes of Exercise per Session:   Stress:   . Feeling of Stress :   Social Connections:   . Frequency of Communication with Friends and Family:   . Frequency of Social Gatherings with Friends and Family:   . Attends Religious Services:   . Active Member of Clubs or Organizations:   .  Attends Archivist Meetings:   Marland Kitchen Marital Status:   Intimate Partner Violence:   . Fear of Current or Ex-Partner:   . Emotionally Abused:   Marland Kitchen Physically Abused:   . Sexually Abused:     Physical Exam: Vital signs in last 24 hours:     GEN: NAD EYE: Sclerae anicteric ENT: MMM CV: Non-tachycardic GI: Soft, TTP in midabdomen NEURO:  Alert & Oriented x 3  Lab Results: No results for input(s): WBC, HGB, HCT, PLT in the last 72 hours. BMET No results for input(s): NA, K, CL, CO2, GLUCOSE, BUN, CREATININE, CALCIUM in the last 72 hours. LFT No results for input(s): PROT, ALBUMIN, AST, ALT, ALKPHOS, BILITOT, BILIDIR, IBILI in the last 72 hours. PT/INR No results for input(s): LABPROT, INR in the last 72 hours.   Impression / Plan: This is a 74 y.o.male who presents for EGD/EUS for evaluation of recurrent pancreatitis and as well as abnormalities and cysts in the pancreas.  The risks of an EUS including intestinal  perforation, bleeding, infection, aspiration, and medication effects were discussed as was the possibility it may not give a definitive diagnosis if a biopsy is performed.  When a biopsy of the pancreas is done as part of the EUS, there is an additional risk of pancreatitis at the rate of about 1-2%.  It was explained that procedure related pancreatitis is typically mild, although it can be severe and even life threatening, which is why we do not perform random pancreatic biopsies and only biopsy a lesion/area we feel is concerning enough to warrant the risk.  The risks and benefits of endoscopic evaluation were discussed with the patient; these include but are not limited to the risk of perforation, infection, bleeding, missed lesions, lack of diagnosis, severe illness requiring hospitalization, as well as anesthesia and sedation related illnesses.  The patient is agreeable to proceed.    Justice Britain, MD [facility] Office # [phone]

## 2020-01-19 ENCOUNTER — Other Ambulatory Visit: Payer: Self-pay

## 2020-01-19 LAB — SURGICAL PATHOLOGY

## 2020-01-20 ENCOUNTER — Encounter: Payer: Self-pay | Admitting: Gastroenterology

## 2020-01-20 ENCOUNTER — Other Ambulatory Visit: Payer: Self-pay

## 2020-01-20 DIAGNOSIS — K859 Acute pancreatitis without necrosis or infection, unspecified: Secondary | ICD-10-CM

## 2020-01-20 LAB — CYTOLOGY - NON PAP

## 2020-01-22 ENCOUNTER — Encounter: Payer: Self-pay | Admitting: Family

## 2020-01-22 DIAGNOSIS — D499 Neoplasm of unspecified behavior of unspecified site: Secondary | ICD-10-CM

## 2020-01-23 ENCOUNTER — Other Ambulatory Visit (INDEPENDENT_AMBULATORY_CARE_PROVIDER_SITE_OTHER): Payer: Medicare HMO

## 2020-01-23 ENCOUNTER — Other Ambulatory Visit: Payer: Self-pay

## 2020-01-23 DIAGNOSIS — D490 Neoplasm of unspecified behavior of digestive system: Secondary | ICD-10-CM

## 2020-01-23 LAB — COMPREHENSIVE METABOLIC PANEL
ALT: 13 U/L (ref 0–53)
AST: 10 U/L (ref 0–37)
Albumin: 4.3 g/dL (ref 3.5–5.2)
Alkaline Phosphatase: 71 U/L (ref 39–117)
BUN: 18 mg/dL (ref 6–23)
CO2: 26 mEq/L (ref 19–32)
Calcium: 9.3 mg/dL (ref 8.4–10.5)
Chloride: 107 mEq/L (ref 96–112)
Creatinine, Ser: 0.87 mg/dL (ref 0.40–1.50)
GFR: 86.33 mL/min (ref >60.00)
Glucose, Bld: 120 mg/dL — ABNORMAL HIGH (ref 70–99)
Potassium: 3.9 mEq/L (ref 3.5–5.1)
Sodium: 140 mEq/L (ref 135–145)
Total Bilirubin: 0.4 mg/dL (ref 0.2–1.2)
Total Protein: 6.8 g/dL (ref 6.0–8.3)

## 2020-01-23 LAB — CBC WITH DIFFERENTIAL/PLATELET
Basophils Absolute: 0.1 10*3/uL (ref 0.0–0.1)
Basophils Relative: 1 % (ref 0.0–3.0)
Eosinophils Absolute: 0.4 10*3/uL (ref 0.0–0.7)
Eosinophils Relative: 6.1 % — ABNORMAL HIGH (ref 0.0–5.0)
HCT: 43.8 % (ref 39.0–52.0)
Hemoglobin: 14.8 g/dL (ref 13.0–17.0)
Lymphocytes Relative: 32.8 % (ref 12.0–46.0)
Lymphs Abs: 2.2 10*3/uL (ref 0.7–4.0)
MCHC: 33.8 g/dL (ref 30.0–36.0)
MCV: 91 fl (ref 78.0–100.0)
Monocytes Absolute: 0.3 10*3/uL (ref 0.1–1.0)
Monocytes Relative: 5 % (ref 3.0–12.0)
Neutro Abs: 3.8 10*3/uL (ref 1.4–7.7)
Neutrophils Relative %: 55.1 % (ref 43.0–77.0)
Platelets: 231 10*3/uL (ref 150.0–400.0)
RBC: 4.82 Mil/uL (ref 4.22–5.81)
RDW: 13.3 % (ref 11.5–15.5)
WBC: 6.9 10*3/uL (ref 4.0–10.5)

## 2020-01-24 LAB — CANCER ANTIGEN 19-9: CA 19-9: 29 U/mL (ref <34)

## 2020-01-25 ENCOUNTER — Telehealth: Payer: Self-pay | Admitting: Gastroenterology

## 2020-01-25 ENCOUNTER — Encounter: Payer: Self-pay | Admitting: Family

## 2020-01-25 NOTE — Telephone Encounter (Signed)
Patient called stated he was referred over for a whipple procedure by Dr. Rush Landmark and he was wanting to let the providers know here that he is also seeking a second opinion with [facility] Dr. Jyl Heinz just incase they call to request his records

## 2020-01-25 NOTE — Telephone Encounter (Signed)
noted 

## 2020-01-27 ENCOUNTER — Other Ambulatory Visit: Payer: Medicare HMO

## 2020-01-27 DIAGNOSIS — D490 Neoplasm of unspecified behavior of digestive system: Secondary | ICD-10-CM | POA: Diagnosis not present

## 2020-01-31 ENCOUNTER — Encounter (HOSPITAL_COMMUNITY): Payer: Self-pay | Admitting: Gastroenterology

## 2020-01-31 NOTE — Addendum Note (Signed)
Addendum  created 01/31/20 1237 by Lidia Collum, MD   Intraprocedure Event edited, Intraprocedure Staff edited

## 2020-02-02 ENCOUNTER — Encounter (HOSPITAL_COMMUNITY): Payer: Self-pay

## 2020-02-02 ENCOUNTER — Other Ambulatory Visit: Payer: Self-pay

## 2020-02-02 ENCOUNTER — Ambulatory Visit (HOSPITAL_COMMUNITY)
Admission: RE | Admit: 2020-02-02 | Discharge: 2020-02-02 | Disposition: A | Discharge status: Home / Self Care | Payer: Medicare HMO | Source: Ambulatory Visit | Attending: Gastroenterology | Admitting: Gastroenterology

## 2020-02-02 ENCOUNTER — Ambulatory Visit: Payer: Medicare HMO | Attending: Internal Medicine

## 2020-02-02 DIAGNOSIS — Z20822 Contact with and (suspected) exposure to covid-19: Secondary | ICD-10-CM | POA: Diagnosis not present

## 2020-02-02 DIAGNOSIS — I2510 Atherosclerotic heart disease of native coronary artery without angina pectoris: Secondary | ICD-10-CM | POA: Diagnosis not present

## 2020-02-02 DIAGNOSIS — D490 Neoplasm of unspecified behavior of digestive system: Secondary | ICD-10-CM | POA: Diagnosis not present

## 2020-02-02 DIAGNOSIS — M47814 Spondylosis without myelopathy or radiculopathy, thoracic region: Secondary | ICD-10-CM | POA: Diagnosis not present

## 2020-02-02 DIAGNOSIS — J8410 Pulmonary fibrosis, unspecified: Secondary | ICD-10-CM | POA: Diagnosis not present

## 2020-02-02 DIAGNOSIS — I700 Atherosclerosis of aorta: Secondary | ICD-10-CM | POA: Diagnosis not present

## 2020-02-02 LAB — PANCREATIC ELASTASE, FECAL: Pancreatic Elastase-1, Stool: >500 mcg/g

## 2020-02-02 MED ORDER — IOHEXOL 300 MG/ML  SOLN
75.0000 mL | Freq: Once | INTRAMUSCULAR | Status: AC | PRN
  Administered 2020-02-02: 75 mL via INTRAVENOUS

## 2020-02-02 MED ORDER — SODIUM CHLORIDE (PF) 0.9 % IJ SOLN
INTRAMUSCULAR | Status: AC
  Filled 2020-02-02: qty 50

## 2020-02-03 LAB — SARS-COV-2, NAA 2 DAY TAT

## 2020-02-03 LAB — NOVEL CORONAVIRUS, NAA: SARS-CoV-2, NAA: NOT DETECTED

## 2020-02-11 IMAGING — CT CT ABD-PELV W/ CM
2 of 5 series · 16 of 46 positions shown, 18 images · IV contrast (APPLIED)
Comparison: None.

CLINICAL DATA: Dyspnea, epigastric pain and diaphoresis.

EXAM:
CT ABDOMEN AND PELVIS WITH CONTRAST
TECHNIQUE: Multidetector CT imaging of the abdomen and pelvis was performed
using the standard protocol following bolus administration of
intravenous contrast.
CONTRAST:  100mL H7PIT7-A22 IOPAMIDOL (H7PIT7-A22) INJECTION 61%

[Series 2: axial st · axial · 0.93mm/px · z∈[-558,-84]mm · 13 of 107 slices shown, 15 images]
[im 6/107  soft-tissue]
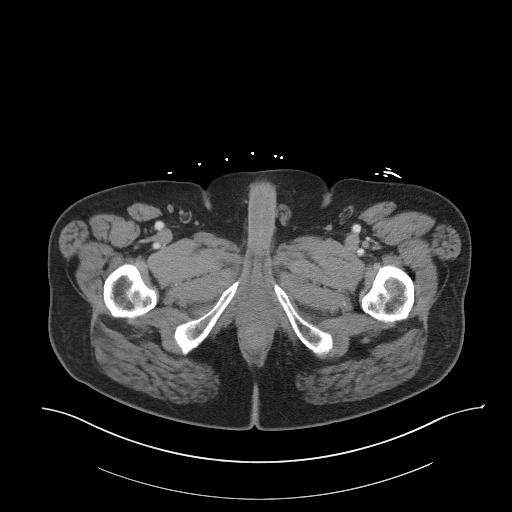
[im 6/107  bone]
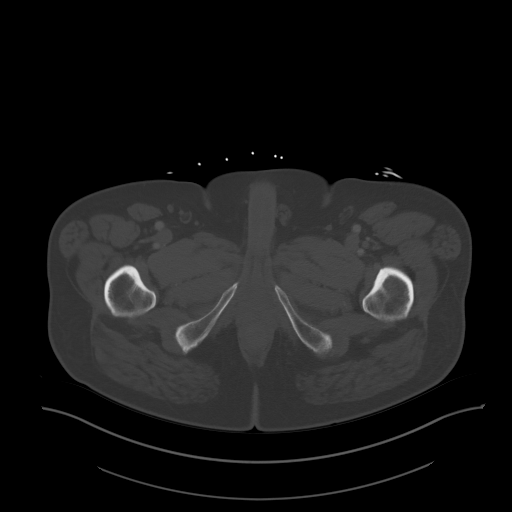
[im 12/107  soft-tissue]
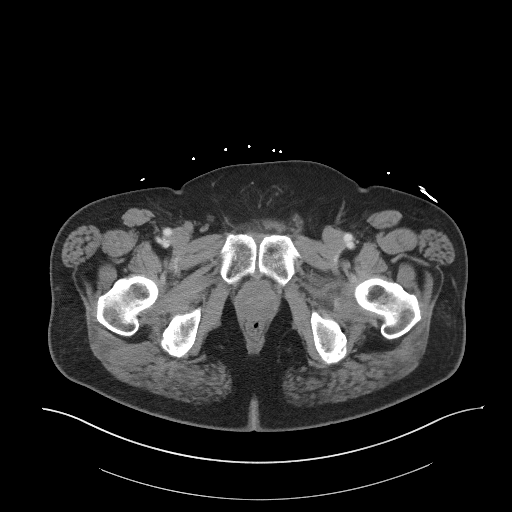
[im 24/107  soft-tissue]
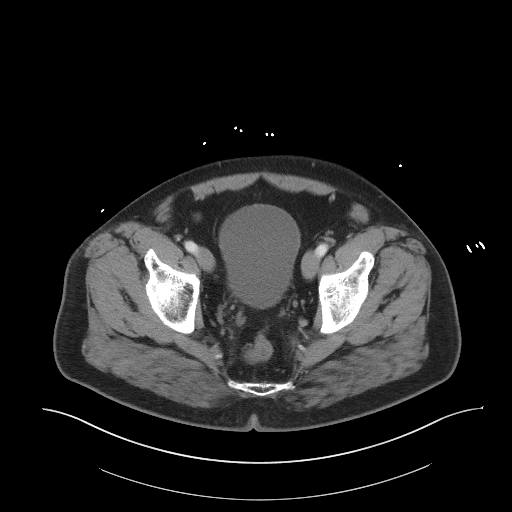
[im 30/107  soft-tissue]
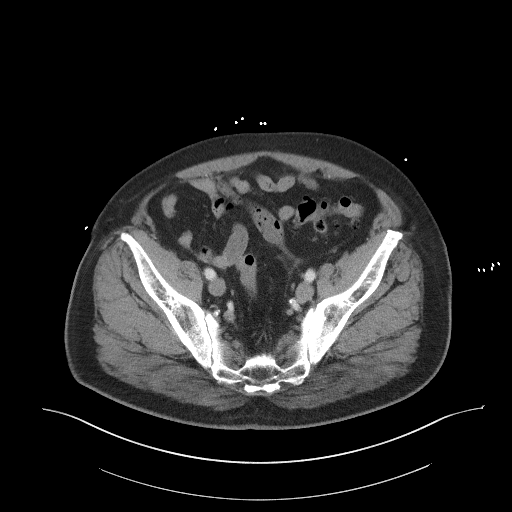
[im 36/107  soft-tissue]
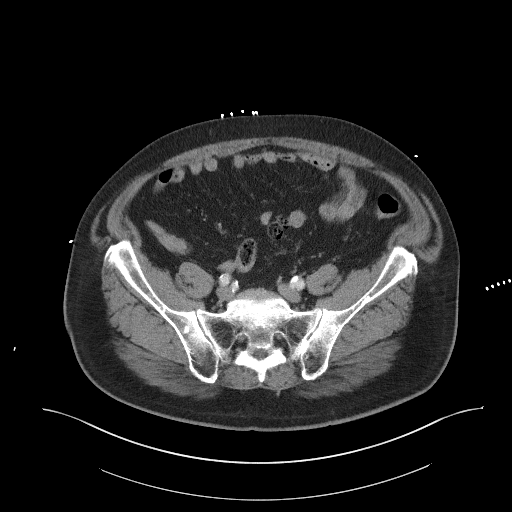
[im 48/107  soft-tissue]
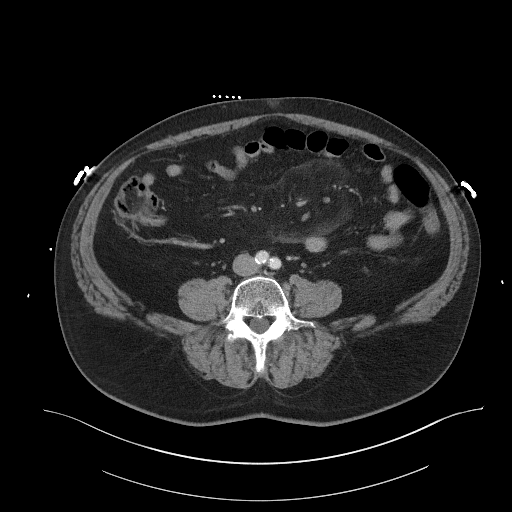
[im 54/107  soft-tissue]
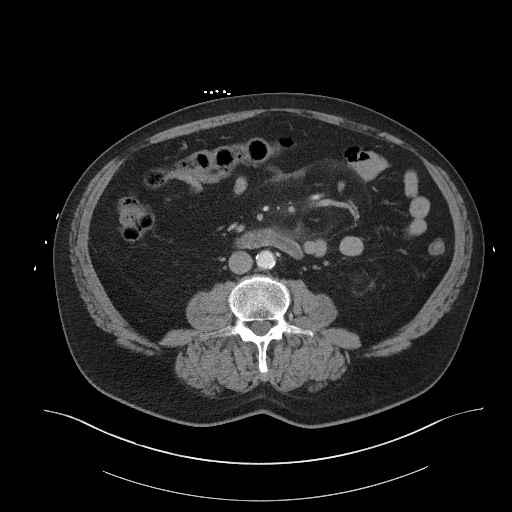
[im 59/107  soft-tissue]
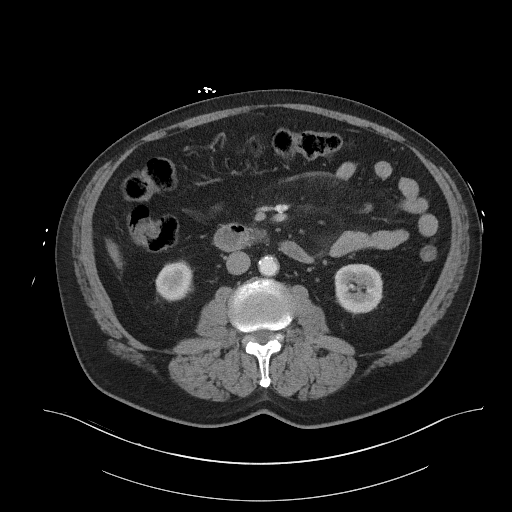
[im 71/107  soft-tissue]
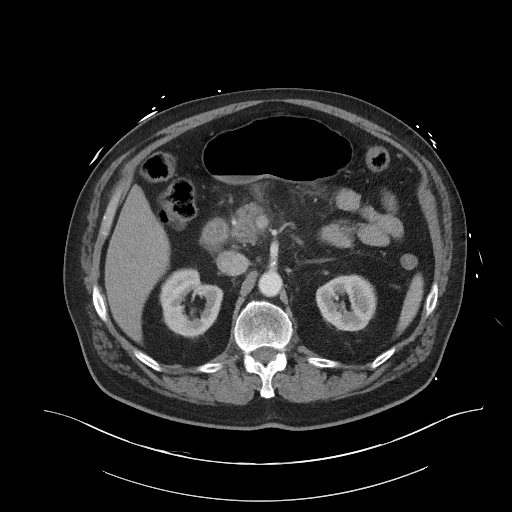
[im 71/107  bone]
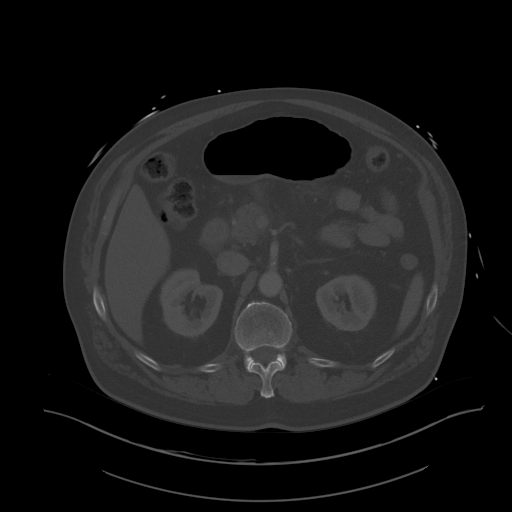
[im 77/107  soft-tissue]
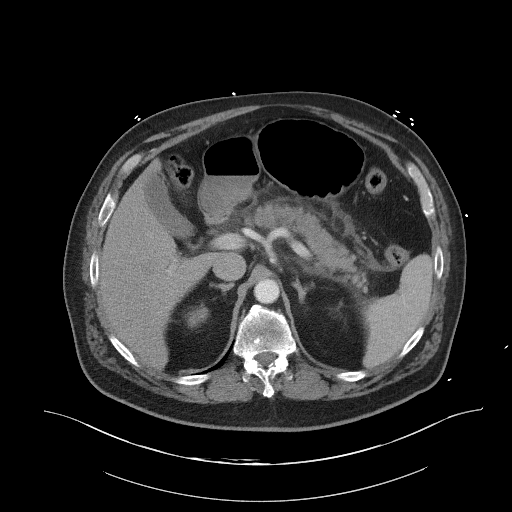
[im 83/107  soft-tissue]
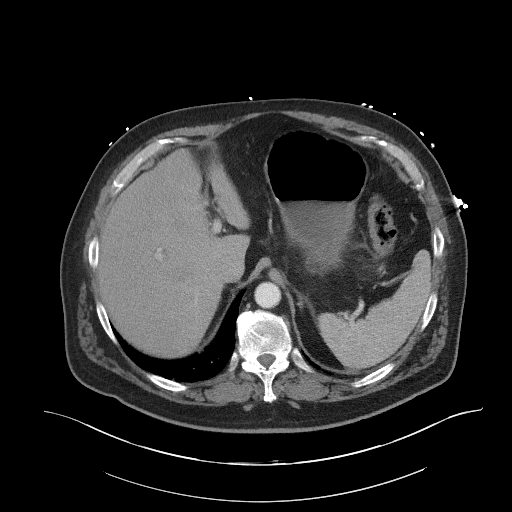
[im 95/107  soft-tissue]
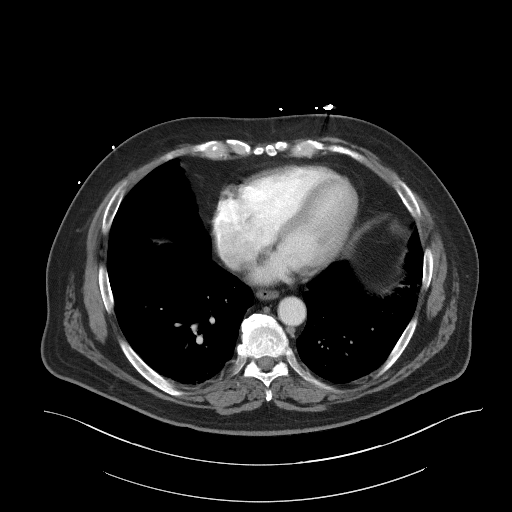
[im 101/107  soft-tissue]
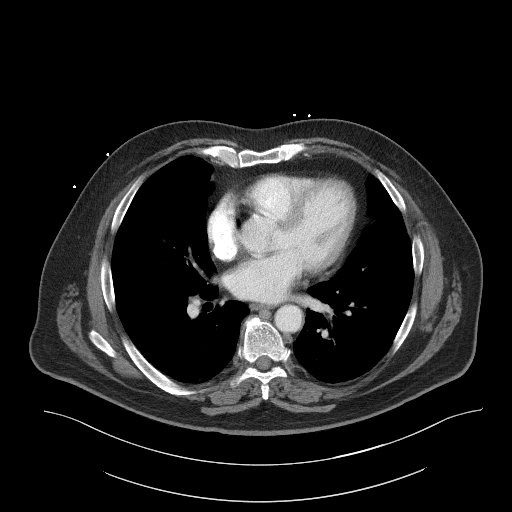

[Series 5: coronal st · coronal · 0.83mm/px · 3 of 103 slices shown]
[im 35/103  soft-tissue]
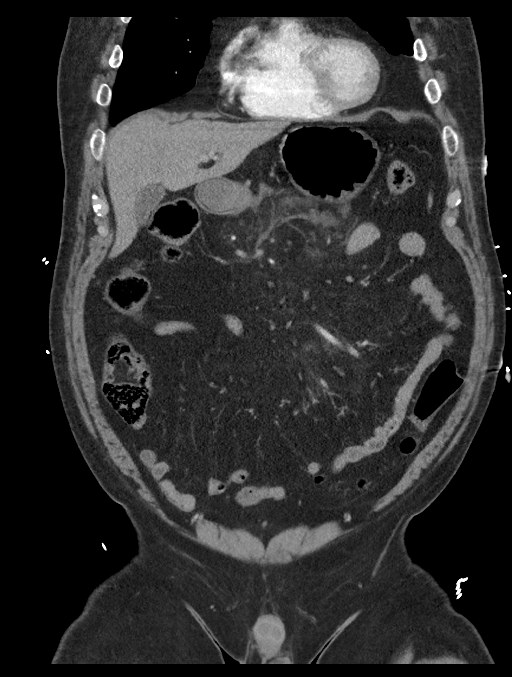
[im 46/103  soft-tissue]
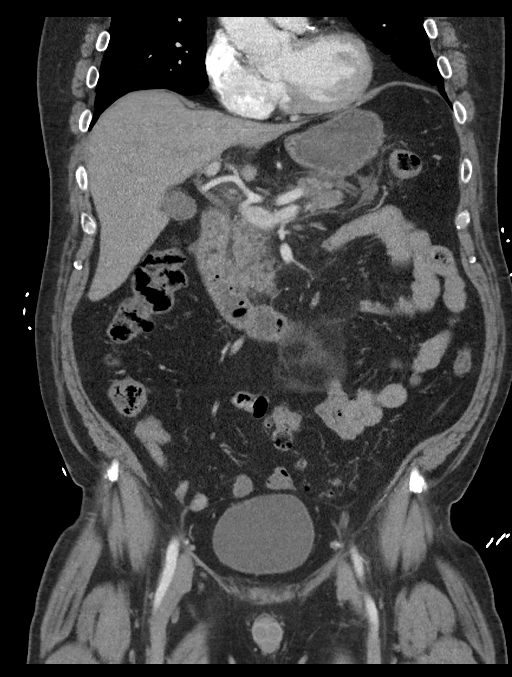
[im 57/103  soft-tissue]
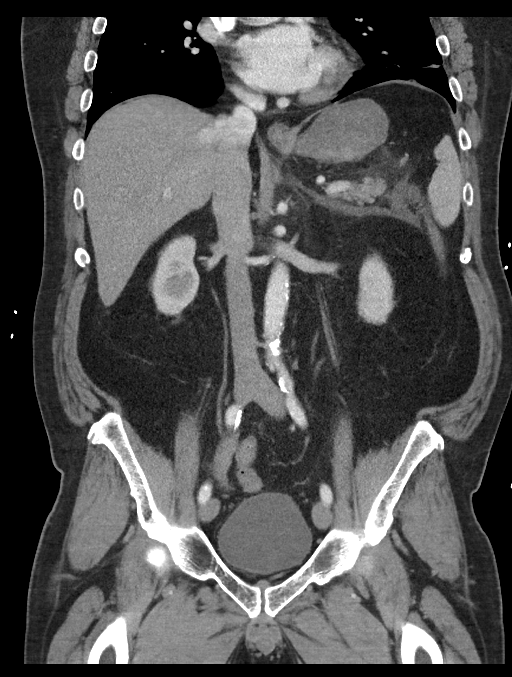

[16 of 46 positions shown; findings below may reference images not displayed]

FINDINGS: Lower chest: Dependent bibasilar atelectasis. Top-normal size heart.

Hepatobiliary: Tiny cyst or hemangioma in the right hepatic lobe
near the dome measuring 7 mm. This too small to further
characterize.

Pancreas: Peripancreatic inflammation without abscess. No pancreatic
gland necrosis or pseudocyst formation. No ductal dilatation. No
stones.

Spleen: Normal

Adrenals/Urinary Tract: Normal

Stomach/Bowel: Stomach is within normal limits. Appendix appears
normal. No evidence of bowel wall thickening, distention, or
inflammatory changes.

Vascular/Lymphatic: Mild moderate aortoiliac atherosclerosis. No
aneurysm or dissection. No lymphadenopathy.

Reproductive: Normal size prostate and seminal vesicles.

Other: No free air nor free fluid. Tiny periumbilical fat containing
hernia.

Musculoskeletal: Mild degenerative change along the lower thoracic
spine. No acute osseous appearing abnormality.
IMPRESSION: 1. Acute uncomplicated pancreatitis.
2. 7 mm cyst or hemangioma in the right hepatic lobe.
3. Aortoiliac atherosclerosis without aneurysm or dissection.

## 2020-02-21 DIAGNOSIS — D490 Neoplasm of unspecified behavior of digestive system: Secondary | ICD-10-CM | POA: Diagnosis not present

## 2020-03-02 DIAGNOSIS — Z8547 Personal history of malignant neoplasm of testis: Secondary | ICD-10-CM | POA: Diagnosis not present

## 2020-03-02 DIAGNOSIS — K8020 Calculus of gallbladder without cholecystitis without obstruction: Secondary | ICD-10-CM | POA: Diagnosis not present

## 2020-03-02 DIAGNOSIS — Z8719 Personal history of other diseases of the digestive system: Secondary | ICD-10-CM | POA: Diagnosis not present

## 2020-03-02 DIAGNOSIS — Z85820 Personal history of malignant melanoma of skin: Secondary | ICD-10-CM | POA: Diagnosis not present

## 2020-03-02 DIAGNOSIS — D490 Neoplasm of unspecified behavior of digestive system: Secondary | ICD-10-CM | POA: Diagnosis not present

## 2020-03-05 DIAGNOSIS — Z0101 Encounter for examination of eyes and vision with abnormal findings: Secondary | ICD-10-CM | POA: Diagnosis not present

## 2020-03-20 DIAGNOSIS — D490 Neoplasm of unspecified behavior of digestive system: Secondary | ICD-10-CM | POA: Diagnosis not present

## 2020-03-30 ENCOUNTER — Ambulatory Visit: Payer: Medicare HMO | Attending: Internal Medicine

## 2020-03-30 ENCOUNTER — Other Ambulatory Visit (HOSPITAL_COMMUNITY): Payer: Self-pay | Admitting: Internal Medicine

## 2020-03-30 DIAGNOSIS — Z23 Encounter for immunization: Secondary | ICD-10-CM

## 2020-03-30 NOTE — Progress Notes (Signed)
   Covid-19 Vaccination Clinic  Name:  Stephen Evans    MRN: [id number] DOB: 03-01-1946  03/30/2020  Stephen Evans was observed post Covid-19 immunization for 15 minutes without incident. He was provided with Vaccine Information Sheet and instruction to access the V-Safe system.   Stephen Evans was instructed to call 911 with any severe reactions post vaccine: Marland Kitchen Difficulty breathing  . Swelling of face and throat  . A fast heartbeat  . A bad rash all over body  . Dizziness and weakness

## 2020-03-31 ENCOUNTER — Encounter: Payer: Self-pay | Admitting: Family

## 2020-04-05 DIAGNOSIS — I491 Atrial premature depolarization: Secondary | ICD-10-CM | POA: Diagnosis not present

## 2020-04-05 DIAGNOSIS — C250 Malignant neoplasm of head of pancreas: Secondary | ICD-10-CM | POA: Diagnosis not present

## 2020-04-05 DIAGNOSIS — Z8547 Personal history of malignant neoplasm of testis: Secondary | ICD-10-CM | POA: Diagnosis not present

## 2020-04-05 DIAGNOSIS — K861 Other chronic pancreatitis: Secondary | ICD-10-CM | POA: Diagnosis not present

## 2020-04-05 DIAGNOSIS — C799 Secondary malignant neoplasm of unspecified site: Secondary | ICD-10-CM | POA: Diagnosis not present

## 2020-04-05 DIAGNOSIS — Y838 Other surgical procedures as the cause of abnormal reaction of the patient, or of later complication, without mention of misadventure at the time of the procedure: Secondary | ICD-10-CM | POA: Diagnosis not present

## 2020-04-05 DIAGNOSIS — R001 Bradycardia, unspecified: Secondary | ICD-10-CM | POA: Diagnosis not present

## 2020-04-05 DIAGNOSIS — K9189 Other postprocedural complications and disorders of digestive system: Secondary | ICD-10-CM | POA: Diagnosis not present

## 2020-04-05 DIAGNOSIS — K567 Ileus, unspecified: Secondary | ICD-10-CM | POA: Diagnosis not present

## 2020-04-05 DIAGNOSIS — Z8249 Family history of ischemic heart disease and other diseases of the circulatory system: Secondary | ICD-10-CM | POA: Diagnosis not present

## 2020-04-05 DIAGNOSIS — I2510 Atherosclerotic heart disease of native coronary artery without angina pectoris: Secondary | ICD-10-CM | POA: Diagnosis not present

## 2020-04-05 DIAGNOSIS — E876 Hypokalemia: Secondary | ICD-10-CM | POA: Diagnosis not present

## 2020-04-05 DIAGNOSIS — Z01818 Encounter for other preprocedural examination: Secondary | ICD-10-CM | POA: Diagnosis not present

## 2020-04-05 DIAGNOSIS — K859 Acute pancreatitis without necrosis or infection, unspecified: Secondary | ICD-10-CM | POA: Diagnosis not present

## 2020-04-11 DIAGNOSIS — C250 Malignant neoplasm of head of pancreas: Secondary | ICD-10-CM | POA: Diagnosis not present

## 2020-04-11 DIAGNOSIS — K861 Other chronic pancreatitis: Secondary | ICD-10-CM | POA: Diagnosis not present

## 2020-04-11 DIAGNOSIS — I2510 Atherosclerotic heart disease of native coronary artery without angina pectoris: Secondary | ICD-10-CM | POA: Diagnosis not present

## 2020-04-11 DIAGNOSIS — Z9889 Other specified postprocedural states: Secondary | ICD-10-CM | POA: Diagnosis not present

## 2020-04-11 DIAGNOSIS — Z8249 Family history of ischemic heart disease and other diseases of the circulatory system: Secondary | ICD-10-CM | POA: Diagnosis not present

## 2020-04-11 DIAGNOSIS — C251 Malignant neoplasm of body of pancreas: Secondary | ICD-10-CM | POA: Diagnosis not present

## 2020-04-11 DIAGNOSIS — R140 Abdominal distension (gaseous): Secondary | ICD-10-CM | POA: Diagnosis not present

## 2020-04-11 DIAGNOSIS — K835 Biliary cyst: Secondary | ICD-10-CM | POA: Diagnosis not present

## 2020-04-11 DIAGNOSIS — R188 Other ascites: Secondary | ICD-10-CM | POA: Diagnosis not present

## 2020-04-11 DIAGNOSIS — D490 Neoplasm of unspecified behavior of digestive system: Secondary | ICD-10-CM | POA: Diagnosis not present

## 2020-04-11 DIAGNOSIS — K567 Ileus, unspecified: Secondary | ICD-10-CM | POA: Diagnosis not present

## 2020-04-11 DIAGNOSIS — Y838 Other surgical procedures as the cause of abnormal reaction of the patient, or of later complication, without mention of misadventure at the time of the procedure: Secondary | ICD-10-CM | POA: Diagnosis not present

## 2020-04-11 DIAGNOSIS — D135 Benign neoplasm of extrahepatic bile ducts: Secondary | ICD-10-CM | POA: Diagnosis not present

## 2020-04-11 DIAGNOSIS — K6389 Other specified diseases of intestine: Secondary | ICD-10-CM | POA: Diagnosis not present

## 2020-04-11 DIAGNOSIS — G8918 Other acute postprocedural pain: Secondary | ICD-10-CM | POA: Diagnosis not present

## 2020-04-11 DIAGNOSIS — K9189 Other postprocedural complications and disorders of digestive system: Secondary | ICD-10-CM | POA: Diagnosis not present

## 2020-04-11 DIAGNOSIS — C799 Secondary malignant neoplasm of unspecified site: Secondary | ICD-10-CM | POA: Diagnosis not present

## 2020-04-11 DIAGNOSIS — K8010 Calculus of gallbladder with chronic cholecystitis without obstruction: Secondary | ICD-10-CM | POA: Diagnosis not present

## 2020-04-11 DIAGNOSIS — K811 Chronic cholecystitis: Secondary | ICD-10-CM | POA: Diagnosis not present

## 2020-04-11 DIAGNOSIS — D136 Benign neoplasm of pancreas: Secondary | ICD-10-CM | POA: Diagnosis not present

## 2020-04-11 DIAGNOSIS — Z8547 Personal history of malignant neoplasm of testis: Secondary | ICD-10-CM | POA: Diagnosis not present

## 2020-04-11 DIAGNOSIS — E876 Hypokalemia: Secondary | ICD-10-CM | POA: Diagnosis not present

## 2020-04-11 DIAGNOSIS — I808 Phlebitis and thrombophlebitis of other sites: Secondary | ICD-10-CM | POA: Diagnosis not present

## 2020-04-12 DIAGNOSIS — D490 Neoplasm of unspecified behavior of digestive system: Secondary | ICD-10-CM | POA: Diagnosis not present

## 2020-04-13 DIAGNOSIS — D490 Neoplasm of unspecified behavior of digestive system: Secondary | ICD-10-CM | POA: Diagnosis not present

## 2020-04-13 DIAGNOSIS — D135 Benign neoplasm of extrahepatic bile ducts: Secondary | ICD-10-CM | POA: Diagnosis not present

## 2020-04-13 DIAGNOSIS — K811 Chronic cholecystitis: Secondary | ICD-10-CM | POA: Diagnosis not present

## 2020-04-13 DIAGNOSIS — K9189 Other postprocedural complications and disorders of digestive system: Secondary | ICD-10-CM | POA: Diagnosis not present

## 2020-04-13 DIAGNOSIS — Z9889 Other specified postprocedural states: Secondary | ICD-10-CM | POA: Diagnosis not present

## 2020-04-13 DIAGNOSIS — C250 Malignant neoplasm of head of pancreas: Secondary | ICD-10-CM | POA: Diagnosis not present

## 2020-04-14 DIAGNOSIS — D490 Neoplasm of unspecified behavior of digestive system: Secondary | ICD-10-CM | POA: Diagnosis not present

## 2020-04-15 DIAGNOSIS — R140 Abdominal distension (gaseous): Secondary | ICD-10-CM | POA: Diagnosis not present

## 2020-04-15 DIAGNOSIS — D490 Neoplasm of unspecified behavior of digestive system: Secondary | ICD-10-CM | POA: Diagnosis not present

## 2020-04-16 DIAGNOSIS — R140 Abdominal distension (gaseous): Secondary | ICD-10-CM | POA: Diagnosis not present

## 2020-04-16 DIAGNOSIS — D490 Neoplasm of unspecified behavior of digestive system: Secondary | ICD-10-CM | POA: Diagnosis not present

## 2020-04-17 DIAGNOSIS — D490 Neoplasm of unspecified behavior of digestive system: Secondary | ICD-10-CM | POA: Diagnosis not present

## 2020-04-18 DIAGNOSIS — D490 Neoplasm of unspecified behavior of digestive system: Secondary | ICD-10-CM | POA: Diagnosis not present

## 2020-04-18 DIAGNOSIS — K6389 Other specified diseases of intestine: Secondary | ICD-10-CM | POA: Diagnosis not present

## 2020-04-19 DIAGNOSIS — R188 Other ascites: Secondary | ICD-10-CM | POA: Diagnosis not present

## 2020-04-19 DIAGNOSIS — K6389 Other specified diseases of intestine: Secondary | ICD-10-CM | POA: Diagnosis not present

## 2020-04-20 DIAGNOSIS — D490 Neoplasm of unspecified behavior of digestive system: Secondary | ICD-10-CM | POA: Diagnosis not present

## 2020-04-21 DIAGNOSIS — I808 Phlebitis and thrombophlebitis of other sites: Secondary | ICD-10-CM | POA: Diagnosis not present

## 2020-04-23 DIAGNOSIS — K9189 Other postprocedural complications and disorders of digestive system: Secondary | ICD-10-CM | POA: Diagnosis not present

## 2020-04-23 DIAGNOSIS — D490 Neoplasm of unspecified behavior of digestive system: Secondary | ICD-10-CM | POA: Diagnosis not present

## 2020-04-25 DIAGNOSIS — K567 Ileus, unspecified: Secondary | ICD-10-CM | POA: Diagnosis not present

## 2020-05-09 DIAGNOSIS — Z483 Aftercare following surgery for neoplasm: Secondary | ICD-10-CM | POA: Diagnosis not present

## 2020-05-09 DIAGNOSIS — D378 Neoplasm of uncertain behavior of other specified digestive organs: Secondary | ICD-10-CM | POA: Diagnosis not present

## 2020-05-09 DIAGNOSIS — Z9049 Acquired absence of other specified parts of digestive tract: Secondary | ICD-10-CM | POA: Diagnosis not present

## 2020-05-09 DIAGNOSIS — Z888 Allergy status to other drugs, medicaments and biological substances status: Secondary | ICD-10-CM | POA: Diagnosis not present

## 2020-05-15 ENCOUNTER — Encounter: Payer: Self-pay | Admitting: Family

## 2020-05-15 DIAGNOSIS — C259 Malignant neoplasm of pancreas, unspecified: Secondary | ICD-10-CM

## 2020-05-15 DIAGNOSIS — C253 Malignant neoplasm of pancreatic duct: Secondary | ICD-10-CM | POA: Diagnosis not present

## 2020-05-15 DIAGNOSIS — Z85820 Personal history of malignant melanoma of skin: Secondary | ICD-10-CM | POA: Diagnosis not present

## 2020-05-15 DIAGNOSIS — Z923 Personal history of irradiation: Secondary | ICD-10-CM | POA: Diagnosis not present

## 2020-05-15 DIAGNOSIS — Z87891 Personal history of nicotine dependence: Secondary | ICD-10-CM | POA: Diagnosis not present

## 2020-05-15 DIAGNOSIS — Z90411 Acquired partial absence of pancreas: Secondary | ICD-10-CM | POA: Diagnosis not present

## 2020-05-15 DIAGNOSIS — Z9049 Acquired absence of other specified parts of digestive tract: Secondary | ICD-10-CM | POA: Diagnosis not present

## 2020-05-15 DIAGNOSIS — Z8547 Personal history of malignant neoplasm of testis: Secondary | ICD-10-CM | POA: Diagnosis not present

## 2020-05-15 DIAGNOSIS — Z9079 Acquired absence of other genital organ(s): Secondary | ICD-10-CM | POA: Diagnosis not present

## 2020-05-15 DIAGNOSIS — Z803 Family history of malignant neoplasm of breast: Secondary | ICD-10-CM | POA: Diagnosis not present

## 2020-05-16 ENCOUNTER — Encounter: Payer: Self-pay | Admitting: *Deleted

## 2020-05-16 DIAGNOSIS — R69 Illness, unspecified: Secondary | ICD-10-CM | POA: Diagnosis not present

## 2020-05-16 NOTE — Progress Notes (Signed)
Patient is seeking a second opinion.   Reached out to [employer] to introduce myself as the office RN Navigator and explain our new patient process. Reviewed the reason for their referral and scheduled their new patient appointment along with labs. Provided address and directions to the office including call back phone number. Reviewed with patient any concerns they may have or any possible barriers to attending their appointment.   Informed patient about my role as a navigator and that I will meet with them prior to their New Patient appointment and more fully discuss what services I can provide. At this time patient has no further questions or needs.   Oncology Nurse Navigator Documentation  Oncology Nurse Navigator Flowsheets 05/16/2020  Navigator Follow Up Date: 05/28/2020  Navigator Follow Up Reason: New Patient Appointment  Navigator Location [facility]  Referral Date to RadOnc/MedOnc 05/16/2020  Navigator Encounter Type Introductory Phone Call  Patient Visit Type MedOnc  Treatment Phase Active Tx  Barriers/Navigation Needs Coordination of Care;Education  Education Other  Interventions Coordination of Care  Acuity Level 2-Minimal Needs (1-2 Barriers Identified)  Coordination of Care Appts  Time Spent with Patient 45

## 2020-05-17 ENCOUNTER — Telehealth: Payer: Self-pay | Admitting: Family

## 2020-05-17 DIAGNOSIS — R918 Other nonspecific abnormal finding of lung field: Secondary | ICD-10-CM

## 2020-05-17 NOTE — Telephone Encounter (Signed)
See mychart.  

## 2020-05-21 DIAGNOSIS — R69 Illness, unspecified: Secondary | ICD-10-CM | POA: Diagnosis not present

## 2020-05-22 ENCOUNTER — Telehealth: Payer: Self-pay | Admitting: Family

## 2020-05-22 ENCOUNTER — Encounter: Payer: Self-pay | Admitting: Family

## 2020-05-22 NOTE — Telephone Encounter (Signed)
-----   Message from Katha Hamming sent at 05/22/2020 11:03 AM EDT ----- Regarding: CT chest w/o [name] says he is busy juggling other appointments with [facility].  He will call back when he is ready to schedule.  He is a aware of the time frame of getting the CT (3 to 6 months).  Thanks, Hoyle Sauer

## 2020-05-23 ENCOUNTER — Telehealth: Payer: Self-pay | Admitting: Family

## 2020-05-23 ENCOUNTER — Encounter: Payer: Self-pay | Admitting: Family

## 2020-05-23 DIAGNOSIS — C259 Malignant neoplasm of pancreas, unspecified: Secondary | ICD-10-CM

## 2020-05-23 NOTE — Telephone Encounter (Signed)
See mychart.  

## 2020-05-28 ENCOUNTER — Encounter: Payer: Self-pay | Admitting: Family

## 2020-05-28 ENCOUNTER — Encounter: Payer: Self-pay | Admitting: Hematology & Oncology

## 2020-05-28 ENCOUNTER — Other Ambulatory Visit: Payer: Self-pay

## 2020-05-28 ENCOUNTER — Inpatient Hospital Stay: Payer: Medicare HMO | Attending: Hematology & Oncology | Admitting: Hematology & Oncology

## 2020-05-28 ENCOUNTER — Inpatient Hospital Stay: Payer: Medicare HMO

## 2020-05-28 VITALS — BP 135/76 | HR 83 | Temp 98.8°F | Resp 18 | Ht 67.0 in | Wt 178.0 lb

## 2020-05-28 DIAGNOSIS — C259 Malignant neoplasm of pancreas, unspecified: Secondary | ICD-10-CM | POA: Diagnosis not present

## 2020-05-28 DIAGNOSIS — Z85820 Personal history of malignant melanoma of skin: Secondary | ICD-10-CM | POA: Insufficient documentation

## 2020-05-28 DIAGNOSIS — Z8547 Personal history of malignant neoplasm of testis: Secondary | ICD-10-CM | POA: Insufficient documentation

## 2020-05-28 DIAGNOSIS — Z87891 Personal history of nicotine dependence: Secondary | ICD-10-CM

## 2020-05-28 DIAGNOSIS — Z800 Family history of malignant neoplasm of digestive organs: Secondary | ICD-10-CM

## 2020-05-28 DIAGNOSIS — Z7189 Other specified counseling: Secondary | ICD-10-CM

## 2020-05-28 DIAGNOSIS — R911 Solitary pulmonary nodule: Secondary | ICD-10-CM

## 2020-05-28 DIAGNOSIS — C251 Malignant neoplasm of body of pancreas: Secondary | ICD-10-CM

## 2020-05-28 DIAGNOSIS — Z803 Family history of malignant neoplasm of breast: Secondary | ICD-10-CM

## 2020-05-28 HISTORY — DX: Other specified counseling: Z71.89

## 2020-05-28 HISTORY — DX: Malignant neoplasm of pancreas, unspecified: C25.9

## 2020-05-28 LAB — CBC WITH DIFFERENTIAL (CANCER CENTER ONLY)
Abs Immature Granulocytes: 0.05 10*3/uL (ref 0.00–0.07)
Basophils Absolute: 0.1 10*3/uL (ref 0.0–0.1)
Basophils Relative: 1 %
Eosinophils Absolute: 0.2 10*3/uL (ref 0.0–0.5)
Eosinophils Relative: 2 %
HCT: 40.7 % (ref 39.0–52.0)
Hemoglobin: 13.1 g/dL (ref 13.0–17.0)
Immature Granulocytes: 1 %
Lymphocytes Relative: 22 %
Lymphs Abs: 2 10*3/uL (ref 0.7–4.0)
MCH: 29.5 pg (ref 26.0–34.0)
MCHC: 32.2 g/dL (ref 30.0–36.0)
MCV: 91.7 fL (ref 80.0–100.0)
Monocytes Absolute: 0.5 10*3/uL (ref 0.1–1.0)
Monocytes Relative: 5 %
Neutro Abs: 6.3 10*3/uL (ref 1.7–7.7)
Neutrophils Relative %: 69 %
Platelet Count: 475 10*3/uL — ABNORMAL HIGH (ref 150–400)
RBC: 4.44 MIL/uL (ref 4.22–5.81)
RDW: 12.3 % (ref 11.5–15.5)
WBC Count: 9 10*3/uL (ref 4.0–10.5)
nRBC: 0 % (ref 0.0–0.2)

## 2020-05-28 LAB — CMP (CANCER CENTER ONLY)
ALT: 12 U/L (ref 0–44)
AST: 10 U/L — ABNORMAL LOW (ref 15–41)
Albumin: 4.1 g/dL (ref 3.5–5.0)
Alkaline Phosphatase: 84 U/L (ref 38–126)
Anion gap: 8 (ref 5–15)
BUN: 15 mg/dL (ref 8–23)
CO2: 30 mmol/L (ref 22–32)
Calcium: 10.1 mg/dL (ref 8.9–10.3)
Chloride: 102 mmol/L (ref 98–111)
Creatinine: 0.88 mg/dL (ref 0.61–1.24)
GFR, Estimated: >60 mL/min (ref >60)
Glucose, Bld: 118 mg/dL — ABNORMAL HIGH (ref 70–99)
Potassium: 4.2 mmol/L (ref 3.5–5.1)
Sodium: 140 mmol/L (ref 135–145)
Total Bilirubin: 0.4 mg/dL (ref 0.3–1.2)
Total Protein: 7.5 g/dL (ref 6.5–8.1)

## 2020-05-28 LAB — LACTATE DEHYDROGENASE: LDH: 101 U/L (ref 98–192)

## 2020-05-28 NOTE — Progress Notes (Signed)
Referral MD  Reason for Referral: Stage IA (T1cN0M0) adenocarcinoma of the pancreas-status post Whipple procedure on 04/11/2020  Chief Complaint  Patient presents with  . New Patient (Initial Visit)  : I am seeing if I need treatment after surgery for wife pancreatic cancer.  HPI: Stephen Evans is a really nice 74 year old white male.  He is originally from [place].  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  He has been down in [place] for 10 years.  He basically came down here so he could not play golf 12 months a year.  He comes in with his wife.  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  He was in the reserves for a couple years.  He was in the [employer].  His history is incredibly interesting.  He has had melanoma twice.  He had a melanoma on the back about 45 years ago.  He said it was stage III.  He had wide local excision.  He then had a melanoma on the right arm about 5 years ago.  He had testicular cancer.  This was of the left testicle.  He subsequently had orchiectomy and radiation therapy for this.  I think this was about 20 years ago.  He now has a fourth cancer.  He clearly is in need of genetic counseling.  He began to have some problems with abdominal pain in the late part of 2019.  He had about a 30 pound weight loss.  He was having episodes of what appear to be pancreatitis.  He then ultimately underwent procedures to see what was going on.  In May 2021, he had a MRCP done.  This showed a cystic lesion in the uncinate 8 of the pancreas.  There is some slight ductal dilatation.  He underwent a EUS in June.  There was some findings concerning for intramural nodularity.  Multiple stones were visualized in the gallbladder.  There is no obvious pathology that was noted.  A CT scan done in July showed findings suspicious for a lesion in the left upper lung measuring 2 mm.  He had a follow-up CT scan in August.   He had some duct dilatation.  No obvious mass was noted.  He of ultimately underwent a Whipple procedure.  This is done by the incredibly talented Dr. Crisoforo Oxford at [facility].  This was done on 04/11/2020.  The pathology report showed a 1 cm well-differentiated invasive adenocarcinoma.  This was a mucinous neoplasm.  Margins were all negative.  Most importantly, is the fact that there were 24 lymph nodes that were all negative.  On the pathology report, is said that there was no lymphovascular space invasion.  There was perineural invasion.  He did have repeat surgery about 3 days later for a biliary leak.  He does have a T-tube that hopefully will be removed soon.  I must say that he look incredibly well for having surgery just about 6 weeks ago.  As such, he was a stage IA (T1cN0M0) adenocarcinoma of the pancreas.  He was seen at [facility] by Dr. Emeterio Reeve of oncology.  Dr. Emeterio Reeve recommended adjuvant therapy.  He recommended Gemzar/Xeloda.  Stephen Evans is now seeking a second opinion as to whether or not he needs adjuvant chemotherapy.  He is in fantastic shape.  His overall performance status is probably ECOG 0.     Past Medical History:  Diagnosis Date  .  Melanoma ([place]) x 2  . Melanoma ([place])   . Melanoma ([place])   . Pancreatitis 06/2018   thought to be due to alcohol  . Testicular cancer ([place]) 2011   testicular--radiation  . Testicular tumor   :  Past Surgical History:  Procedure Laterality Date  . BIOPSY  01/18/2020   Procedure: BIOPSY;  Surgeon: Irving Copas., MD;  Location: [facility];  Service: Gastroenterology;;  . COLONOSCOPY  11/01/2003  . ESOPHAGOGASTRODUODENOSCOPY (EGD) WITH PROPOFOL N/A 01/18/2020   Procedure: ESOPHAGOGASTRODUODENOSCOPY (EGD) WITH PROPOFOL;  Surgeon: Rush Landmark [name], MD;  Location: [facility];  Service: Gastroenterology;  Laterality: N/A;  . EUS N/A 01/18/2020   Procedure: UPPER ENDOSCOPIC ULTRASOUND (EUS) LINEAR;   Surgeon: Irving Copas., MD;  Location: [facility];  Service: Gastroenterology;  Laterality: N/A;  . FINE NEEDLE ASPIRATION N/A 01/18/2020   Procedure: FINE NEEDLE ASPIRATION (FNA) LINEAR;  Surgeon: Irving Copas., MD;  Location: [facility];  Service: Gastroenterology;  Laterality: N/A;  . LYMPH GLAND EXCISION Right 1979   axillary, benign  . MELANOMA EXCISION Right 03/2014   elbow  . ORCHIECTOMY Right 2001   followed by radiation  . TONSILLECTOMY  1959  . wide excision of melanoma on back  1979  :   Current Outpatient Medications:  .  sodium chloride flush 0.9 % SOLN injection, 10 mLs by Intracatheter route as needed., Disp: , Rfl:  .  acetaminophen (TYLENOL) 500 MG tablet, Take by mouth., Disp: , Rfl:  .  enoxaparin (LOVENOX) 40 MG/0.4ML injection, Inject into the skin., Disp: , Rfl:  .  metoCLOPramide (REGLAN) 10 MG tablet, Take 10 mg by mouth 4 (four) times daily., Disp: , Rfl:  .  Multiple Vitamin (MULTIVITAMIN) tablet, Take 1 tablet by mouth daily., Disp: , Rfl:  .  omeprazole (PRILOSEC) 40 MG capsule, Take 1 capsule (40 mg total) by mouth daily., Disp: 30 capsule, Rfl: 3 .  pantoprazole (PROTONIX) 40 MG tablet, Take 40 mg by mouth daily., Disp: , Rfl:  .  Testosterone 100 MG PLLT, Apply 1 application topically daily. , Disp: , Rfl:  .  valACYclovir (VALTREX) 1000 MG tablet, Take 1,000 mg by mouth daily as needed (cold sores). , Disp: , Rfl: :  :  Allergies  Allergen Reactions  . Prednisone     pancreatitis  :  Family History  Problem Relation Age of Onset  . Epilepsy Mother   . Esophageal cancer Father         smoker/drinker died at 64  . Breast cancer Sister         diagnosed at 74  . Alcohol abuse Brother   . Colon cancer Neg Hx   . Colon polyps Neg Hx   . Rectal cancer Neg Hx   . Stomach cancer Neg Hx   . Pancreatic cancer Neg Hx   :  Social History   Socioeconomic History  . Marital status: Married    Spouse name: Not on file  .  Number of children: 2  . Years of education: Not on file  . Highest education level: Not on file  Occupational History  . Occupation: retired    Comment: Arts development officer  Tobacco Use  . Smoking status: Former Smoker    Years: 30.00    Types: Cigarettes    Quit date: 09/06/1997    Years since quitting: 22.7  . Smokeless tobacco: Never Used  Vaping Use  . Vaping Use: Never used  Substance and Sexual Activity  .  Alcohol use: Yes    Comment: 1 every 2 weeks  . Drug use: No  . Sexual activity: Not on file  Other Topics Concern  . 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 Difficulty of Paying Living Expenses: Not on file  Food Insecurity:   . Worried About Charity fundraiser in the Last Year: Not on file  . Ran Out of Food in the Last Year: Not on file  Transportation Needs:   . Lack of Transportation (Medical): Not on file  . Lack of Transportation (Non-Medical): Not on file  Physical Activity:   . Days of Exercise per Week: Not on file  . Minutes of Exercise per Session: Not on file  Stress:   . Feeling of Stress : Not on file  Social Connections:   . Frequency of Communication with Friends and Family: Not on file  . Frequency of Social Gatherings with Friends and Family: Not on file  . Attends Religious Services: Not on file  . Active Member of Clubs or Organizations: Not on file  . Attends Archivist Meetings: Not on file  . Marital Status: Not on file  Intimate Partner Violence:   . Fear of Current or Ex-Partner: Not on file  . Emotionally Abused: Not on file  . Physically Abused: Not on file  . Sexually Abused: Not on file   :  Review of Systems  Constitutional: Negative.   HENT: Negative.   Eyes: Negative.   Respiratory: Negative.   Cardiovascular: Negative.   Gastrointestinal: Positive for nausea.  Genitourinary: Negative.   Musculoskeletal: Negative.   Skin: Negative.   Neurological: Negative.   Endo/Heme/Allergies: Negative.   Psychiatric/Behavioral: Negative.      Exam:   This is a well-developed and well-nourished white male in no obvious distress.  Vital signs show temperature of 98.8.  Pulse 83.  Blood pressure 135/76.  Weight is 178 pounds.  Head and neck exam shows no ocular or oral lesions.  There are no palpable cervical or supraclavicular lymph nodes.  He has no scleral icterus.  Lungs are clear to percussion and auscultation bilaterally.  Cardiac exam regular rate and rhythm with no murmurs, rubs or bruits.  Abdomen is soft.  He has good bowel sounds.  There is no fluid wave.  He has a T-tube coming out from the right upper quadrant.  He has the laparotomy scar that is well-healed.  There is no fluid wave.  There is no palpable liver or spleen tip.  Back exam shows a wide local excision scar in the right upper back.  There is no tenderness over the spine, ribs or hips.  Extremities shows the wide local excision scar on the right arm.  He has good range of motion of his joints.  He has good strength in his upper and lower extremities.  Neurological exam shows no focal neurological deficits.  Skin exam shows no rashes, ecchymoses or petechia.    @IPVITALS @   Recent Labs    05/28/20 1059  WBC 9.0  HGB 13.1  HCT 40.7  PLT 475*  Recent Labs    05/28/20 1059  NA 140  K 4.2  CL 102  CO2 30  GLUCOSE 118*  BUN 15  CREATININE 0.88  CALCIUM 10.1    Blood smear review: None  Pathology: See above    Assessment and Plan: Stephen Evans is a very nice 44 year old white male.  He has a very interesting history with multiple malignancies.  Recently, he was found to have a stage IA  pancreatic cancer.  This is incredibly unusual.  This was really a matter of being aggressive with his evaluation and being persistent.  I have to believe that his risk of recurrence is going to be less than 30%.  I realize that even with early stage pancreatic cancer, there is definitely a risk of recurrence.  To me, the most important part of the pathology report of the 24 lymph nodes that were all negative.  Also, there is no lymphovascular space invasion.  There was some perineural invasion which might be construed as a high risk for recurrence.  All surgical margins were negative.  The tumor was well differentiated.  He is struggling with whether or not to take chemotherapy or not.  I told him that the standard of care clearly is adjuvant chemotherapy for resected pancreatic cancer.  Again, it be hard to find a lot of studies show this early stage pancreatic cancer and chemotherapy and the benefit.  He had a recommendation from Dr. Emeterio Reeve regarding gemcitabine/Xeloda.  I think this certainly is reasonable.  I know that FOLFIRINOX is definitely a standard protocol for pancreatic cancer.  However, this is for the typical pancreatic cancer that was resected and is of a higher stage.  Stephen Evans is going to have a Port-A-Cath placed this Friday.  He would like to have treatment in our office.  We are only about 5 minutes from where he lives.  I think this would be reasonable.  I am sure Dr. Emeterio Reeve would have no problems with [place] treating him for convenience of the patient.  We will certainly do follow-up with our scans.  Stephen Evans will let us know what he would like to do.  Again, we can certainly set him up here for treatment.  I spent a good hour with he and his wife.  They are both incredibly interesting to talk to.  We had a lot of fun and I answered all of their questions.

## 2020-05-28 NOTE — Progress Notes (Signed)
START ON PATHWAY REGIMEN - Pancreatic Adenocarcinoma     A cycle is every 28 days:     Capecitabine      Gemcitabine   **Always confirm dose/schedule in your pharmacy ordering system**  Patient Characteristics: Postoperative without Neoadjuvant Therapy (Pathologic Staging), Negative Margins (Includes Positive Lymph Nodes) Therapeutic Status: Postoperative without Neoadjuvant Therapy (Pathologic Staging) AJCC T Category: pT1c AJCC N Category: pN0 AJCC M Category: cM0 AJCC 8 Stage Grouping: IA Intent of Therapy: Curative Intent, Discussed with Patient

## 2020-05-29 ENCOUNTER — Telehealth: Payer: Self-pay | Admitting: Hematology & Oncology

## 2020-05-29 LAB — CANCER ANTIGEN 19-9: CA 19-9: 40 U/mL — ABNORMAL HIGH (ref 0–35)

## 2020-05-29 NOTE — Telephone Encounter (Signed)
No los 1/11 °

## 2020-05-30 ENCOUNTER — Telehealth: Payer: Self-pay | Admitting: Genetic Counselor

## 2020-05-30 ENCOUNTER — Encounter: Payer: Self-pay | Admitting: Hematology & Oncology

## 2020-05-30 ENCOUNTER — Encounter: Payer: Self-pay | Admitting: *Deleted

## 2020-05-30 DIAGNOSIS — R69 Illness, unspecified: Secondary | ICD-10-CM | POA: Diagnosis not present

## 2020-05-30 NOTE — Addendum Note (Signed)
Addended by: Burney Gauze R on: 05/30/2020 08:15 AM   Modules accepted: Orders

## 2020-05-30 NOTE — Progress Notes (Signed)
Per Dr Marin Olp, patient will decide if he wants to be treated here, or remain at [facility]. Called today and left a message on personal voice mail, reaching out to see if patient had any further questions, or had reached at decision. Requested call back.  Patient was scheduled with genetics on 06/04/2020.  Oncology Nurse Navigator Documentation  Oncology Nurse Navigator Flowsheets 05/30/2020  Navigator Follow Up Date: -  Navigator Follow Up Reason: -  Navigator Location [facility]  Referral Date to RadOnc/MedOnc -  Navigator Encounter Type Telephone  Telephone Appt Confirmation/Clarification;Asess Navigation Needs;Outgoing Call  Patient Visit Type MedOnc  Treatment Phase Active Tx  Barriers/Navigation Needs Coordination of Care;Education  Education -  Interventions Coordination of Care  Acuity Level 2-Minimal Needs (1-2 Barriers Identified)  Coordination of Care Appts  Support Groups/Services Friends and Family  Time Spent with Patient 15  Genetic Counseling Type Urgent  Genetic Counseling Date 06/04/2020

## 2020-05-30 NOTE — Telephone Encounter (Signed)
Received an urgent genetic counseling referral from Dr. Marin Olp. Mr. Yeates has been cld and scheduled for a genetic counseling appt on 11/8 at 11am. Pt aware to arrive 15 minutes early.

## 2020-05-31 ENCOUNTER — Encounter: Payer: Self-pay | Admitting: *Deleted

## 2020-05-31 DIAGNOSIS — C259 Malignant neoplasm of pancreas, unspecified: Secondary | ICD-10-CM | POA: Diagnosis not present

## 2020-05-31 DIAGNOSIS — K838 Other specified diseases of biliary tract: Secondary | ICD-10-CM | POA: Diagnosis not present

## 2020-05-31 DIAGNOSIS — K9189 Other postprocedural complications and disorders of digestive system: Secondary | ICD-10-CM | POA: Diagnosis not present

## 2020-05-31 NOTE — Progress Notes (Signed)
Please see MyChart message  Oncology Nurse Navigator Documentation  Oncology Nurse Navigator Flowsheets 05/31/2020  Navigator Follow Up Date: -  Navigator Follow Up Reason: -  Navigation Complete Date: 05/31/2020  Post Navigation: Continue to Follow Patient? No  Reason Not Navigating Patient: Treatment Not Indicated  Navigator Location [facility]  Referral Date to RadOnc/MedOnc -  Navigator Encounter Type MyChart  Telephone -  Patient Visit Type MedOnc  Treatment Phase Active Tx  Barriers/Navigation Needs Coordination of Care;Education  Education -  Interventions Psycho-Social Support  Acuity Level 2-Minimal Needs (1-2 Barriers Identified)  Coordination of Care -  Support Groups/Services Friends and Family  Time Spent with Patient 30  Genetic Counseling Type -  Genetic Counseling Date -

## 2020-06-04 ENCOUNTER — Inpatient Hospital Stay: Payer: Medicare HMO

## 2020-06-04 ENCOUNTER — Other Ambulatory Visit: Payer: Self-pay | Admitting: Hematology & Oncology

## 2020-06-04 ENCOUNTER — Other Ambulatory Visit: Payer: Self-pay

## 2020-06-04 ENCOUNTER — Inpatient Hospital Stay (HOSPITAL_BASED_OUTPATIENT_CLINIC_OR_DEPARTMENT_OTHER): Payer: Medicare HMO | Admitting: Genetic Counselor

## 2020-06-04 DIAGNOSIS — Z803 Family history of malignant neoplasm of breast: Secondary | ICD-10-CM

## 2020-06-04 DIAGNOSIS — Z85820 Personal history of malignant melanoma of skin: Secondary | ICD-10-CM

## 2020-06-04 DIAGNOSIS — C251 Malignant neoplasm of body of pancreas: Secondary | ICD-10-CM

## 2020-06-05 ENCOUNTER — Encounter: Payer: Self-pay | Admitting: Genetic Counselor

## 2020-06-05 NOTE — Progress Notes (Signed)
REFERRING PROVIDER: [name], MD [address] [place],  [place] [address]  PRIMARY PROVIDER:  [name], NP  PRIMARY REASON FOR VISIT:  1. Malignant neoplasm of body of pancreas ([place])   2. Personal history of malignant melanoma   3. Family history of breast cancer    HISTORY OF PRESENT ILLNESS:   Stephen Evans, a 74 y.o. male, was seen for a [facility] cancer genetics consultation at the request of Dr. Marin Olp due to a personal history of pancreatic cancer and melanoma and family history of breast cancer.  Stephen Evans presents to clinic today with his wife to discuss the possibility of a hereditary predisposition to cancer, to discuss genetic testing, and to further clarify his future cancer risks, as well as potential cancer risks for family members.   In 2021, at the age of 53, Stephen Evans was diagnosed with adenocarcinoma of the pancreas stage IA. The treatment plan included Whipple procedure.  Stephen Evans had pancreatitis in 2019.  Approximately 5 years ago, Stephen Evans was diagnosed with melanoma on his right arm.  Approximately 45 years ago, Stephen Evans was diagnosed with melanoma on his back.  Per Stephen Evans, this was stage III melanoma and was treated with wide local excision.   Approximately 20 years ago, Stephen Evans was diagnosed with testicular cancer.  He treated treated with orchiectomy and radiation therapy.   RISK FACTORS:  Colonoscopy: yes; most recent in 2005; normal fecal occult blood test per patient. Prostate cancer screening: PSA regularly per patient Other cancer screening: dermatology annually   Past Medical History:  Diagnosis Date  . Goals of care, counseling/discussion 05/28/2020  . Melanoma ([place]) x 2  . Melanoma ([place])   . Melanoma ([place])   . Pancreas cancer ([place]) 05/28/2020  . Pancreatitis 06/2018   thought to be due to alcohol  . Testicular cancer ([place]) 2011   testicular--radiation  . Testicular tumor     Past Surgical History:  Procedure  Laterality Date  . BIOPSY  01/18/2020   Procedure: BIOPSY;  Surgeon: Irving Copas., MD;  Location: [facility];  Service: Gastroenterology;;  . COLONOSCOPY  11/01/2003  . ESOPHAGOGASTRODUODENOSCOPY (EGD) WITH PROPOFOL N/A 01/18/2020   Procedure: ESOPHAGOGASTRODUODENOSCOPY (EGD) WITH PROPOFOL;  Surgeon: Rush Landmark [name], MD;  Location: [facility];  Service: Gastroenterology;  Laterality: N/A;  . EUS N/A 01/18/2020   Procedure: UPPER ENDOSCOPIC ULTRASOUND (EUS) LINEAR;  Surgeon: Irving Copas., MD;  Location: [facility];  Service: Gastroenterology;  Laterality: N/A;  . FINE NEEDLE ASPIRATION N/A 01/18/2020   Procedure: FINE NEEDLE ASPIRATION (FNA) LINEAR;  Surgeon: Irving Copas., MD;  Location: [facility];  Service: Gastroenterology;  Laterality: N/A;  . LYMPH GLAND EXCISION Right 1979   axillary, benign  . MELANOMA EXCISION Right 03/2014   elbow  . ORCHIECTOMY Right 2001   followed by radiation  . TONSILLECTOMY  1959  . wide excision of melanoma on back  1979    Social History   Socioeconomic History  . Marital status: Married    Spouse name: Not on file  . Number of children: 2  . Years of education: Not on file  . Highest education level: Not on file  Occupational History  . Occupation: retired    Comment: Arts development officer  Tobacco Use  . Smoking status: Former Smoker    Years: 30.00    Types: Cigarettes    Quit date: 09/06/1997    Years since quitting: 22.7  . Smokeless  tobacco: Never Used  Vaping Use  . Vaping Use: Never used  Substance and Sexual Activity  . Alcohol use: Yes    Comment: 1 every 2 weeks  . Drug use: No  . Sexual activity: Not on file  Other Topics Concern  . 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 Difficulty of Paying Living Expenses: Not on file  Food Insecurity:   . Worried About Charity fundraiser in the Last Year: Not on file  . Ran Out of Food in the Last Year: Not on file  Transportation Needs:   . Lack of Transportation (Medical): Not on file  . Lack of Transportation (Non-Medical): Not on file  Physical Activity:   . Days of Exercise per Week: Not on file  . Minutes of Exercise per Session: Not on file  Stress:   . Feeling of Stress : Not on file  Social Connections:   . Frequency of Communication with Friends and Family: Not on file  . Frequency of Social Gatherings with Friends and Family: Not on file  . Attends Religious Services: Not on file  . Active Member of Clubs or Organizations: Not on file  . Attends Archivist Meetings: Not on file  . Marital Status: Not on file     FAMILY HISTORY:  We obtained a detailed, 4-generation family history.  Significant diagnoses are listed below: Family History  Problem Relation Age of Onset  . Esophageal cancer Father 75       smoking/drinking hx  . Breast cancer Sister 9  . Cancer Maternal Uncle        unknown type; d. late 21s  . Testicular cancer Maternal Grandfather        unknown age       Stephen Evans has one brother and two sisters in their 2s.  One sister was diagnosed with breast cancer at age 13.  Stephen Evans mother is living at age 4.  Stephen Evans maternal uncle was diagnosed with an unknown cancer and passed away in his late 63s.  Stephen Evans maternal grandfather was diagnosed with testicular cancer at an unknown age and passed away at 85.  No other maternal family history of cancer was reported.  Stephen Evans father passed away at age 18 from cancer of the esophagus.  No other paternal family history of cancer was reported.   Stephen Evans is unaware of previous family history of  genetic testing for hereditary cancer risks. Patient's maternal ancestors are of [place] and [place] descent, and paternal ancestors are of [place] descent. There is no reported Ashkenazi Jewish ancestry. There is no known consanguinity.  GENETIC COUNSELING ASSESSMENT: Stephen Evans is a 74 y.o. male with a personal history of pancreatic cancer and melanoma which is somewhat suggestive of a hereditary cancer syndrome and predisposition to cancer. We, therefore, discussed and recommended the following at today's visit.   DISCUSSION: We discussed that 5 - 10% of cancer is hereditary, with many cases of hereditary pancreatic cancer associated with mutations in BRCA1/2.  There are other genes that can be associated with hereditary  pancreatic cancer syndromes.  These include but are not limited to CDKN2A.  We discussed that testing is beneficial for several reasons including knowing how to follow individuals after completing their treatment and understanding if other family members could be at risk for cancer and allowing them to undergo genetic testing.   We reviewed the characteristics, features and inheritance patterns of hereditary cancer syndromes. We also discussed genetic testing, including the appropriate family members to test, the process of testing, insurance coverage and turn-around-time for results. We discussed the implications of a negative, positive, carrier and/or variant of uncertain significant result. We recommended Stephen Evans pursue genetic testing for a panel that includes genes associated with pancreatic cancer, melanoma, and breast cancer.   Stephen Evans  was offered a common hereditary cancer panel and an expanded pan-cancer panel. Stephen Evans was informed of the benefits and limitations of each panel, including that expanded pan-cancer panels contain several preliminary evidence genes that do not have clear management guidelines at this point in time.  We also discussed that as the number of genes  included on a panel increases, the chances of variants of uncertain significance increases.  After considering the benefits and limitations of each gene panel, Stephen Evans. Klemens  elected to have an expanded pan-cancer panel through [facility].  The Multi-Cancer Panel with pancreatitis genes and preliminary pancreatic cancer genes offered by [facility] includes sequencing and/or deletion duplication testing of the following 91 genes: AIP, ALK, APC, ATM, AXIN2,BAP1,  BARD1, BLM, BMPR1A, BRCA1, BRCA2, BRIP1, CASR, CDC73, CDH1, CDK4, CDKN1B, CDKN1C, CDKN2A (p14ARF), CDKN2A (p16INK4a), CEBPA, CFTR, CHEK2, CPA1, CTNNA1, CTRC, DICER1, DIS3L2, EGFR (c.2369C>T, p.Thr790Met variant only), EPCAM (Deletion/duplication testing only), FANCC, FH, FLCN, GATA2, GPC3, GREM1 (Promoter region deletion/duplication testing only), HOXB13 (c.251G>A, p.Gly84Glu), HRAS, KIT, MAX, MEN1, MET, MITF (c.952G>A, p.Glu318Lys variant only), MLH1, MSH2, MSH3, MSH6, MUTYH, NBN, NF1, NF2, NTHL1, PALB2, PALLD, PDGFRA, PHOX2B, PMS2, POLD1, POLE, POT1, PRKAR1A, PRSS1, PTCH1, PTEN, RAD50, RAD51C, RAD51D, RB1, RECQL4, RET, RUNX1, SDHAF2, SDHA (sequence changes only), SDHB, SDHC, SDHD, SMAD4, SMARCA4, SMARCB1, SMARCE1, SPINK1, STK11, SUFU, TERC, TERT, TMEM127, TP53, TSC1, TSC2, VHL, WRN and WT1.   Based on Stephen Evans. Appleyard personal history of pancreatic cancer, he meets medical criteria for genetic testing. Despite that he meets criteria, he may still have an out of pocket cost. We discussed [facility]'s sponsored genetic testing program for those affected with pancreatic cancer. Stephen Evans. Karen was informed that, through this program, [facility] offers testing at no cost to the patient and may elect to share de-identified patient information to third parties and commercial organizations.  After considering the sponsored program and being informed of other cost options, Stephen Evans. Hamre elected to proceed with the genetic test through the sponsored program called [facility] Detect Pancreatic  Cancer.   PLAN: After considering the risks, benefits, and limitations, Stephen Evans. Bufkin provided informed consent to pursue genetic testing and the saliva sample was sent to [facility] for analysis of the Multi-Cancer Panel. Results should be available within approximately 3 weeks' time, at which point they will be disclosed by telephone to Stephen Evans. Kohan, as will any additional recommendations warranted by these results. Stephen Evans. Riemenschneider will receive a summary of his genetic counseling visit and a copy of his results once available. This information will also be available in Epic.   Lastly, we encouraged Stephen Evans. Cuffe to remain in contact with cancer genetics annually so that we can continuously update the family history and inform him of any changes in cancer genetics and testing that may be  of benefit for this family.   Stephen Evans. Borum questions were answered to his satisfaction today. Our contact information was provided should additional questions or concerns arise. Thank you for the referral and allowing [place] to share in the care of your patient.   Marilea Gwynne M. Joette Catching, [place], [facility] Certified Film/video [email or web address] (P) [phone]  The patient was seen for a total of 60 minutes in face-to-face genetic counseling.  This patient was discussed with Drs. Magrinat, Lindi Adie and/or Burr Medico who agrees with the above.  _______________________________________________________________________ For Office Staff:  Number of people involved in session: 1 Was an Intern/ student involved with case: no

## 2020-06-12 DIAGNOSIS — C259 Malignant neoplasm of pancreas, unspecified: Secondary | ICD-10-CM | POA: Diagnosis not present

## 2020-06-14 ENCOUNTER — Encounter: Payer: Self-pay | Admitting: Hematology & Oncology

## 2020-06-14 ENCOUNTER — Telehealth: Payer: Self-pay

## 2020-06-14 NOTE — Telephone Encounter (Signed)
Spoke with pt and he is aware of his f/u appt 06/2020 per inbasket message... AOM

## 2020-06-18 ENCOUNTER — Other Ambulatory Visit: Payer: Self-pay

## 2020-06-18 ENCOUNTER — Encounter (HOSPITAL_BASED_OUTPATIENT_CLINIC_OR_DEPARTMENT_OTHER): Payer: Self-pay

## 2020-06-18 ENCOUNTER — Telehealth: Payer: Self-pay | Admitting: Genetic Counselor

## 2020-06-18 ENCOUNTER — Ambulatory Visit (HOSPITAL_BASED_OUTPATIENT_CLINIC_OR_DEPARTMENT_OTHER)
Admission: RE | Admit: 2020-06-18 | Discharge: 2020-06-18 | Disposition: A | Discharge status: Home / Self Care | Payer: Medicare HMO | Source: Ambulatory Visit | Attending: Family | Admitting: Family

## 2020-06-18 DIAGNOSIS — I2510 Atherosclerotic heart disease of native coronary artery without angina pectoris: Secondary | ICD-10-CM | POA: Diagnosis not present

## 2020-06-18 DIAGNOSIS — R911 Solitary pulmonary nodule: Secondary | ICD-10-CM | POA: Diagnosis not present

## 2020-06-18 DIAGNOSIS — R918 Other nonspecific abnormal finding of lung field: Secondary | ICD-10-CM | POA: Diagnosis present

## 2020-06-18 DIAGNOSIS — I77810 Thoracic aortic ectasia: Secondary | ICD-10-CM | POA: Diagnosis not present

## 2020-06-18 DIAGNOSIS — I700 Atherosclerosis of aorta: Secondary | ICD-10-CM | POA: Diagnosis not present

## 2020-06-18 MED ORDER — IOHEXOL 300 MG/ML  SOLN
100.0000 mL | Freq: Once | INTRAMUSCULAR | Status: AC | PRN
  Administered 2020-06-18: 80 mL via INTRAVENOUS

## 2020-06-18 NOTE — Telephone Encounter (Signed)
Contacted patient in attempt to disclose results of genetic testing.  LVM with contact information requesting a call back.  

## 2020-06-20 ENCOUNTER — Telehealth: Payer: Self-pay | Admitting: Genetic Counselor

## 2020-06-20 ENCOUNTER — Ambulatory Visit: Payer: Self-pay | Admitting: Genetic Counselor

## 2020-06-20 ENCOUNTER — Encounter: Payer: Self-pay | Admitting: Genetic Counselor

## 2020-06-20 DIAGNOSIS — Z803 Family history of malignant neoplasm of breast: Secondary | ICD-10-CM

## 2020-06-20 DIAGNOSIS — Z85820 Personal history of malignant melanoma of skin: Secondary | ICD-10-CM

## 2020-06-20 DIAGNOSIS — Z1379 Encounter for other screening for genetic and chromosomal anomalies: Secondary | ICD-10-CM

## 2020-06-20 DIAGNOSIS — C251 Malignant neoplasm of body of pancreas: Secondary | ICD-10-CM

## 2020-06-20 NOTE — Telephone Encounter (Signed)
Revealed negative genetic testing.  Discussed that we do not know why he has a history of pancreatic cancer or why there is cancer in the family. It could be sporadic, due to a different gene that we are not testing, or maybe our current technology may not be able to pick something up.  It will be important for him to keep in contact with genetics to keep up with whether additional testing may be needed.

## 2020-06-20 NOTE — Progress Notes (Signed)
HPI:  Mr. Ceesay was previously seen in the [place] clinic due to a personal and family history of cancer and concerns regarding a hereditary predisposition to cancer. Please refer to our prior cancer genetics clinic note for more information regarding our discussion, assessment and recommendations, at the time. Mr. Asfaw recent genetic test results were disclosed to him, as were recommendations warranted by these results. These results and recommendations are discussed in more detail below.  CANCER HISTORY:   In 2021, at the age of 72, Mr. Rappaport was diagnosed with adenocarcinoma of the pancreas stage IA. The treatment plan included Whipple procedure.  Mr. Chew had pancreatitis in 2019.  Approximately 5 years ago, Mr. Plantz was diagnosed with melanoma on his right arm.  Approximately 45 years ago, Mr. Lortie was diagnosed with melanoma on his back.  Per Mr. Bunn, this was stage III melanoma and was treated with wide local excision.   Approximately 20 years ago, Mr. Mcclenton was diagnosed with testicular cancer.  He treated treated with orchiectomy and radiation therapy.  Oncology History  Pancreas cancer ([place])  05/28/2020 Initial Diagnosis   Pancreas cancer ([place])   06/04/2020 -  Chemotherapy   The patient had gemcitabine (GEMZAR) 1,938 mg in sodium chloride 0.9 % 250 mL chemo infusion, 1,000 mg/m2, Intravenous,  Once, 0 of 6 cycles  for chemotherapy treatment.    06/18/2020 Genetic Testing   Negative genetic testing: no pathogenic variants detected in [facility] Multi-Cancer, Pancreatic Cancer Panel, and Pancreatitis Genes.  The report date is June 18, 2020.   The Multi-Cancer Panel with pancreatitis genes and preliminary pancreatic cancer genes offered by [facility] includes sequencing and/or deletion duplication testing of the following 91 genes: AIP, ALK, APC, ATM, AXIN2,BAP1,  BARD1, BLM, BMPR1A, BRCA1, BRCA2, BRIP1, CASR, CDC73, CDH1, CDK4, CDKN1B, CDKN1C, CDKN2A (p14ARF), CDKN2A  (p16INK4a), CEBPA, CFTR, CHEK2, CPA1, CTNNA1, CTRC, DICER1, DIS3L2, EGFR (c.2369C>T, p.Thr790Met variant only), EPCAM (Deletion/duplication testing only), FANCC, FH, FLCN, GATA2, GPC3, GREM1 (Promoter region deletion/duplication testing only), HOXB13 (c.251G>A, p.Gly84Glu), HRAS, KIT, MAX, MEN1, MET, MITF (c.952G>A, p.Glu318Lys variant only), MLH1, MSH2, MSH3, MSH6, MUTYH, NBN, NF1, NF2, NTHL1, PALB2, PALLD, PDGFRA, PHOX2B, PMS2, POLD1, POLE, POT1, PRKAR1A, PRSS1, PTCH1, PTEN, RAD50, RAD51C, RAD51D, RB1, RECQL4, RET, RNF43, RUNX1, SDHAF2, SDHA (sequence changes only), SDHB, SDHC, SDHD, SMAD4, SMARCA4, SMARCB1, SMARCE1, SPINK1, STK11, SUFU, TERC, TERT, TMEM127, TP53, TSC1, TSC2, VHL, WRN and WT1.     FAMILY HISTORY:  We obtained a detailed, 4-generation family history.  Significant diagnoses are listed below: Family History  Problem Relation Age of Onset   Esophageal cancer Father 37       smoking/drinking hx   Breast cancer Sister 69   Cancer Maternal Uncle        unknown type; d. late 60s   Testicular cancer Maternal Grandfather        unknown age        Mr. Leanos has one brother and two sisters in their 65s.  One sister was diagnosed with breast cancer at age 83.  Mr. Lua mother is living at age 62.  Mr. Albrecht maternal uncle was diagnosed with an unknown cancer and passed away in his late 15s.  Mr. Livingood maternal grandfather was diagnosed with testicular cancer at an unknown age and passed away at 80.  No other maternal family history of cancer was reported.  Mr. Nilsson father passed away at age 12 from cancer of the esophagus.  No other paternal family history of cancer was  reported.   Mr. Macauley is unaware of previous family history of genetic testing for hereditary cancer risks. Patient's maternal ancestors are of [place] and [place] descent, and paternal ancestors are of [place] descent. There is no reported Ashkenazi Jewish ancestry. There is no known consanguinity.     GENETIC TEST RESULTS: Genetic testing reported out on June 18, 2020. The [facility] Multi-Cancer Panel+Pancreatic Cancer Panel+Pancreatitis Genes found no pathogenic mutations. The Multi-Cancer Panel with pancreatitis genes and preliminary pancreatic cancer genes offered by [facility] includes sequencing and/or deletion duplication testing of the following 91 genes: AIP, ALK, APC, ATM, AXIN2,BAP1,  BARD1, BLM, BMPR1A, BRCA1, BRCA2, BRIP1, CASR, CDC73, CDH1, CDK4, CDKN1B, CDKN1C, CDKN2A (p14ARF), CDKN2A (p16INK4a), CEBPA, CFTR, CHEK2, CPA1, CTNNA1, CTRC, DICER1, DIS3L2, EGFR (c.2369C>T, p.Thr790Met variant only), EPCAM (Deletion/duplication testing only), FANCC, FH, FLCN, GATA2, GPC3, GREM1 (Promoter region deletion/duplication testing only), HOXB13 (c.251G>A, p.Gly84Glu), HRAS, KIT, MAX, MEN1, MET, MITF (c.952G>A, p.Glu318Lys variant only), MLH1, MSH2, MSH3, MSH6, MUTYH, NBN, NF1, NF2, NTHL1, PALB2, PALLD, PDGFRA, PHOX2B, PMS2, POLD1, POLE, POT1, PRKAR1A, PRSS1, PTCH1, PTEN, RAD50, RAD51C, RAD51D, RB1, RECQL4, RET, RNF43, RUNX1, SDHAF2, SDHA (sequence changes only), SDHB, SDHC, SDHD, SMAD4, SMARCA4, SMARCB1, SMARCE1, SPINK1, STK11, SUFU, TERC, TERT, TMEM127, TP53, TSC1, TSC2, VHL, WRN and WT1.   The test report has been scanned into EPIC and is located under the Molecular Pathology section of the Results Review tab.  A portion of the result report is included below for reference.      We discussed with Mr. Velazquez that because current genetic testing is not perfect, it is possible there may be a gene mutation in one of these genes that current testing cannot detect, but that chance is small.  We also discussed, that there could be another gene that has not yet been discovered, or that we have not yet tested, that is responsible for the cancer diagnoses in the family. It is also possible there is a hereditary cause for the cancer in the family that Mr. Portell did not inherit and therefore was not identified in  his testing.  Therefore, it is important to remain in touch with cancer genetics in the future so that we can continue to offer Mr. Beedle the most up to date genetic testing.   ADDITIONAL GENETIC TESTING: We discussed with Mr. Maberry that his genetic testing was fairly extensive.  If there are genes identified to increase cancer risk that can be analyzed in the future, we would be happy to discuss and coordinate this testing at that time.    CANCER SCREENING RECOMMENDATIONS: Mr. Frommelt test result is considered negative (normal).  This means that we have not identified a hereditary cause for his personal history of pancreatic cancer at this time. Most cancers happen by chance and this negative test suggests that his cancer may fall into this category.    While reassuring, this does not definitively rule out a hereditary predisposition to cancer. It is still possible that there could be genetic mutations that are undetectable by current technology. There could be genetic mutations in genes that have not been tested or identified to increase cancer risk.  Therefore, it is recommended he continue to follow the cancer management and screening guidelines provided by his oncology and primary healthcare provider.   An individual's cancer risk and medical management are not determined by genetic test results alone. Overall cancer risk assessment incorporates additional factors, including personal medical history, family history, and any available genetic information that may result in a personalized plan  for cancer prevention and surveillance  RECOMMENDATIONS FOR FAMILY MEMBERS:  Individuals in this family might be at some increased risk of developing cancer, over the general population risk, simply due to the family history of cancer.  We recommended women in this family have a yearly mammogram beginning at age 54, or 110 years younger than the earliest onset of cancer, an annual clinical breast exam, and perform  monthly breast self-exams. Women in this family should also have a gynecological exam as recommended by their primary provider. All family members should be referred for colonoscopy starting at age 39.  FOLLOW-UP: Lastly, we discussed with Mr. Bring that cancer genetics is a rapidly advancing field and it is possible that new genetic tests will be appropriate for him and/or his family members in the future. We encouraged him to remain in contact with cancer genetics on an annual basis so we can update his personal and family histories and let him know of advances in cancer genetics that may benefit this family.   Our contact number was provided. Mr. Diel questions were answered to his satisfaction, and he knows he is welcome to call us at anytime with additional questions or concerns.   Breonia Kirstein M. Joette Catching, [place], [place] Film/video [email or web address] (P) [phone]

## 2020-06-27 DIAGNOSIS — R69 Illness, unspecified: Secondary | ICD-10-CM | POA: Diagnosis not present

## 2020-07-24 ENCOUNTER — Other Ambulatory Visit: Payer: Self-pay | Admitting: *Deleted

## 2020-07-24 DIAGNOSIS — C251 Malignant neoplasm of body of pancreas: Secondary | ICD-10-CM

## 2020-07-24 DIAGNOSIS — Z85820 Personal history of malignant melanoma of skin: Secondary | ICD-10-CM

## 2020-07-25 ENCOUNTER — Encounter: Payer: Self-pay | Admitting: Hematology & Oncology

## 2020-07-25 ENCOUNTER — Inpatient Hospital Stay (HOSPITAL_BASED_OUTPATIENT_CLINIC_OR_DEPARTMENT_OTHER): Payer: Medicare HMO | Admitting: Hematology & Oncology

## 2020-07-25 ENCOUNTER — Other Ambulatory Visit: Payer: Self-pay

## 2020-07-25 ENCOUNTER — Inpatient Hospital Stay: Payer: Medicare HMO | Attending: Hematology & Oncology

## 2020-07-25 ENCOUNTER — Telehealth: Payer: Self-pay

## 2020-07-25 VITALS — BP 127/70 | HR 73 | Temp 98.4°F | Resp 20 | Wt 184.0 lb

## 2020-07-25 DIAGNOSIS — C251 Malignant neoplasm of body of pancreas: Secondary | ICD-10-CM

## 2020-07-25 DIAGNOSIS — Z85820 Personal history of malignant melanoma of skin: Secondary | ICD-10-CM

## 2020-07-25 DIAGNOSIS — Z8547 Personal history of malignant neoplasm of testis: Secondary | ICD-10-CM | POA: Insufficient documentation

## 2020-07-25 LAB — CMP (CANCER CENTER ONLY)
ALT: 41 U/L (ref 0–44)
AST: 25 U/L (ref 15–41)
Albumin: 4.2 g/dL (ref 3.5–5.0)
Alkaline Phosphatase: 126 U/L (ref 38–126)
Anion gap: 8 (ref 5–15)
BUN: 17 mg/dL (ref 8–23)
CO2: 30 mmol/L (ref 22–32)
Calcium: 10.1 mg/dL (ref 8.9–10.3)
Chloride: 104 mmol/L (ref 98–111)
Creatinine: 0.87 mg/dL (ref 0.61–1.24)
GFR, Estimated: >60 mL/min (ref >60)
Glucose, Bld: 89 mg/dL (ref 70–99)
Potassium: 4 mmol/L (ref 3.5–5.1)
Sodium: 142 mmol/L (ref 135–145)
Total Bilirubin: 0.5 mg/dL (ref 0.3–1.2)
Total Protein: 7.1 g/dL (ref 6.5–8.1)

## 2020-07-25 LAB — CBC WITH DIFFERENTIAL (CANCER CENTER ONLY)
Abs Immature Granulocytes: 0.02 10*3/uL (ref 0.00–0.07)
Basophils Absolute: 0 10*3/uL (ref 0.0–0.1)
Basophils Relative: 0 %
Eosinophils Absolute: 0.3 10*3/uL (ref 0.0–0.5)
Eosinophils Relative: 4 %
HCT: 42 % (ref 39.0–52.0)
Hemoglobin: 13.5 g/dL (ref 13.0–17.0)
Immature Granulocytes: 0 %
Lymphocytes Relative: 27 %
Lymphs Abs: 1.8 10*3/uL (ref 0.7–4.0)
MCH: 28.7 pg (ref 26.0–34.0)
MCHC: 32.1 g/dL (ref 30.0–36.0)
MCV: 89.2 fL (ref 80.0–100.0)
Monocytes Absolute: 0.4 10*3/uL (ref 0.1–1.0)
Monocytes Relative: 6 %
Neutro Abs: 4.3 10*3/uL (ref 1.7–7.7)
Neutrophils Relative %: 63 %
Platelet Count: 338 10*3/uL (ref 150–400)
RBC: 4.71 MIL/uL (ref 4.22–5.81)
RDW: 13.2 % (ref 11.5–15.5)
WBC Count: 6.8 10*3/uL (ref 4.0–10.5)
nRBC: 0 % (ref 0.0–0.2)

## 2020-07-25 LAB — LACTATE DEHYDROGENASE: LDH: 118 U/L (ref 98–192)

## 2020-07-25 MED ORDER — PANCRELIPASE (LIP-PROT-AMYL) 36000-114000 UNITS PO CPEP: ORAL_CAPSULE | ORAL | 11 refills | Status: DC

## 2020-07-25 NOTE — Telephone Encounter (Signed)
appts made per 07/25/20 los, pt req not to print...aom 

## 2020-07-25 NOTE — Progress Notes (Signed)
Hematology and Oncology Follow Up Visit  Stephen Evans [id number] 1945-08-04 74 y.o. 07/25/2020   Principle Diagnosis:   Stage Ia (T1cN0M0) adenocarcinoma of the pancreas-pancreas body  Current Therapy:    Status post Whipple procedure on 04/11/2020     Interim History:  Stephen Evans is back for second office visit  First saw him in early November.  At that time, we talked about adjuvant chemotherapy for him.  It was recommended at [facility] which I truly understand.  However, he really did not wish to undergo adjuvant chemotherapy.  When I saw him, his CA 19-9 was slightly elevated at 40.  He is doing okay.  Has a lot of "gas."  I think that Creon might help this.  We will try him on Creon taking 2 pills 3 times a day.  Has little bit of abdominal discomfort.  I suspect this probably is from his surgery and scar tissue.  He has not had diarrhea.  He did have a CT scan of the chest done on 06/18/2020.  This was ordered by his family doctor.  This showed no worrisome pulmonary lesions that would suggest metastatic disease.  He and his wife had a nice Christmas.  Before Christmas, 2 grandchildren came in from [place].  It was nice that they could see them.  Hopefully, next year they will go on cruises.  This might be difficult given the COVID outbreak again.  He has had no cough.  He walks.  He plays golf.  There is no bleeding.  Overall, I would say his performance status is ECOG 1.  Medications:  Current Outpatient Medications:  .  acetaminophen (TYLENOL) 500 MG tablet, Take 500 mg by mouth every 8 (eight) hours as needed., Disp: , Rfl:  .  ibuprofen (ADVIL) 200 MG tablet, Take 200 mg by mouth every 6 (six) hours as needed., Disp: , Rfl:  .  lipase/protease/amylase (CREON) 36000 UNITS CPEP capsule, Take 2 capsules (72,000 Units total) by mouth 3 (three) times daily with meals. May also take 1 capsule (36,000 Units total) as needed (with snacks)., Disp: 240 capsule, Rfl:  11 .  Multiple Vitamin (MULTIVITAMIN) tablet, Take 1 tablet by mouth daily., Disp: , Rfl:  .  pantoprazole (PROTONIX) 40 MG tablet, Take 40 mg by mouth daily., Disp: , Rfl:  .  Testosterone 100 MG PLLT, Apply 1 application topically daily. , Disp: , Rfl:  .  valACYclovir (VALTREX) 1000 MG tablet, Take 1,000 mg by mouth daily as needed (cold sores). , Disp: , Rfl:   Allergies:  Allergies  Allergen Reactions  . Prednisone     pancreatitis    Past Medical History, Surgical history, Social history, and Family History were reviewed and updated.  Review of Systems: Review of Systems  Constitutional: Negative.   HENT:  Negative.   Eyes: Negative.   Respiratory: Negative.   Cardiovascular: Negative.   Gastrointestinal: Positive for abdominal pain.  Endocrine: Negative.   Genitourinary: Negative.    Musculoskeletal: Negative.   Skin: Negative.   Neurological: Negative.   Hematological: Negative.   Psychiatric/Behavioral: Negative.     Physical Exam:  weight is 184 lb (83.5 kg). His oral temperature is 98.4 F (36.9 C). His blood pressure is 127/70 and his pulse is 73. His respiration is 20 and oxygen saturation is 99%.   Wt Readings from Last 3 Encounters:  07/25/20 184 lb (83.5 kg)  05/28/20 178 lb (80.7 kg)  01/18/20 190 lb (86.2 kg)  Physical Exam Vitals reviewed.  HENT:     Head: Normocephalic and atraumatic.     Mouth/Throat:     Mouth: Oropharynx is clear and moist.  Eyes:     Extraocular Movements: EOM normal.     Pupils: Pupils are equal, round, and reactive to light.  Cardiovascular:     Rate and Rhythm: Normal rate and regular rhythm.     Heart sounds: Normal heart sounds.  Pulmonary:     Effort: Pulmonary effort is normal.     Breath sounds: Normal breath sounds.  Abdominal:     General: Bowel sounds are normal.     Palpations: Abdomen is soft.     Comments: Abdominal exam shows a soft abdomen.  Bowel sounds are present.  Has well healed laparotomy scar  in the midline.  He does not have a keloid formation.  There is no fluid wave.  He has little bit of tenderness throughout the abdomen to palpation.  He has no rebound tenderness.  There is no obvious fluid wave.  I cannot palpate his liver or spleen tip.  Musculoskeletal:        General: No tenderness, deformity or edema. Normal range of motion.     Cervical back: Normal range of motion.  Lymphadenopathy:     Cervical: No cervical adenopathy.  Skin:    General: Skin is warm and dry.     Findings: No erythema or rash.  Neurological:     Mental Status: He is alert and oriented to person, place, and time.  Psychiatric:        Mood and Affect: Mood and affect normal.        Behavior: Behavior normal.        Thought Content: Thought content normal.        Judgment: Judgment normal.      Lab Results  Component Value Date   WBC 6.8 07/25/2020   HGB 13.5 07/25/2020   HCT 42.0 07/25/2020   MCV 89.2 07/25/2020   PLT 338 07/25/2020     Chemistry      Component Value Date/Time   NA 142 07/25/2020 0824   K 4.0 07/25/2020 0824   CL 104 07/25/2020 0824   CO2 30 07/25/2020 0824   BUN 17 07/25/2020 0824   CREATININE 0.87 07/25/2020 0824   CREATININE 0.90 05/21/2018 1606      Component Value Date/Time   CALCIUM 10.1 07/25/2020 0824   ALKPHOS 126 07/25/2020 0824   AST 25 07/25/2020 0824   ALT 41 07/25/2020 0824   BILITOT 0.5 07/25/2020 0824      Impression and Plan: [name] is a very nice 74 year old white male.  Again is very interesting to talk to.  He is originally from [place].  He worked for [employer].  He was in the [employer].  He has seen our Dietitian.  Does have a history of melanoma and testicular cancer.  Testing did not reveal any specific genetic mutation that puts him at risk.  He sees Dr. Crisoforo Oxford at [facility] in May.  Dr. Crisoforo Oxford will do scans on him at that time.  I would like to see him back in March.  I want to see him before he goes on his second cruise, which will be  to the [place].  We will see what his CA 19-9 looks like.  Hopefully, the Creon will help with the "gas" that he has.  Again, is very interesting to talk to he and his wife.  Hopefully, there will be no evidence of recurrent disease although this is certainly a risk with pancreatic cancer.   [name], MD 12/29/202110:52 AM

## 2020-07-26 ENCOUNTER — Encounter: Payer: Self-pay | Admitting: Hematology & Oncology

## 2020-07-26 ENCOUNTER — Encounter: Payer: Self-pay | Admitting: Family

## 2020-07-26 LAB — CANCER ANTIGEN 19-9: CA 19-9: 61 U/mL — ABNORMAL HIGH (ref 0–35)

## 2020-07-26 NOTE — Addendum Note (Signed)
Addended by: Arlan Organ R on: 07/26/2020 02:22 PM   Modules accepted: Orders

## 2020-07-30 ENCOUNTER — Encounter: Payer: Self-pay | Admitting: Hematology & Oncology

## 2020-08-01 DIAGNOSIS — Z85820 Personal history of malignant melanoma of skin: Secondary | ICD-10-CM | POA: Diagnosis not present

## 2020-08-01 DIAGNOSIS — D2261 Melanocytic nevi of right upper limb, including shoulder: Secondary | ICD-10-CM | POA: Diagnosis not present

## 2020-08-01 DIAGNOSIS — L821 Other seborrheic keratosis: Secondary | ICD-10-CM | POA: Diagnosis not present

## 2020-08-01 DIAGNOSIS — D1801 Hemangioma of skin and subcutaneous tissue: Secondary | ICD-10-CM | POA: Diagnosis not present

## 2020-08-01 DIAGNOSIS — Z85828 Personal history of other malignant neoplasm of skin: Secondary | ICD-10-CM | POA: Diagnosis not present

## 2020-08-01 DIAGNOSIS — L905 Scar conditions and fibrosis of skin: Secondary | ICD-10-CM | POA: Diagnosis not present

## 2020-08-01 DIAGNOSIS — D224 Melanocytic nevi of scalp and neck: Secondary | ICD-10-CM | POA: Diagnosis not present

## 2020-08-01 DIAGNOSIS — L814 Other melanin hyperpigmentation: Secondary | ICD-10-CM | POA: Diagnosis not present

## 2020-08-02 DIAGNOSIS — C259 Malignant neoplasm of pancreas, unspecified: Secondary | ICD-10-CM | POA: Diagnosis not present

## 2020-08-03 ENCOUNTER — Encounter: Payer: Self-pay | Admitting: Hematology & Oncology

## 2020-08-06 ENCOUNTER — Encounter: Payer: Self-pay | Admitting: Hematology & Oncology

## 2020-08-13 ENCOUNTER — Ambulatory Visit: Payer: Medicare HMO | Admitting: Family

## 2020-08-14 ENCOUNTER — Ambulatory Visit: Payer: Medicare HMO | Admitting: Family

## 2020-08-15 ENCOUNTER — Ambulatory Visit (HOSPITAL_BASED_OUTPATIENT_CLINIC_OR_DEPARTMENT_OTHER)
Admission: RE | Admit: 2020-08-15 | Discharge: 2020-08-15 | Disposition: A | Discharge status: Home / Self Care | Payer: Medicare HMO | Source: Ambulatory Visit | Attending: Family | Admitting: Family

## 2020-08-15 ENCOUNTER — Other Ambulatory Visit: Payer: Self-pay

## 2020-08-15 ENCOUNTER — Ambulatory Visit (INDEPENDENT_AMBULATORY_CARE_PROVIDER_SITE_OTHER): Payer: Medicare HMO | Admitting: Family

## 2020-08-15 ENCOUNTER — Encounter: Payer: Self-pay | Admitting: Family

## 2020-08-15 VITALS — BP 135/70 | HR 71 | Temp 98.1°F | Resp 16 | Ht 67.0 in | Wt 186.0 lb

## 2020-08-15 DIAGNOSIS — M25512 Pain in left shoulder: Secondary | ICD-10-CM

## 2020-08-15 DIAGNOSIS — R1013 Epigastric pain: Secondary | ICD-10-CM | POA: Diagnosis not present

## 2020-08-15 DIAGNOSIS — C259 Malignant neoplasm of pancreas, unspecified: Secondary | ICD-10-CM

## 2020-08-15 DIAGNOSIS — S4992XA Unspecified injury of left shoulder and upper arm, initial encounter: Secondary | ICD-10-CM | POA: Diagnosis not present

## 2020-08-15 DIAGNOSIS — Z23 Encounter for immunization: Secondary | ICD-10-CM

## 2020-08-15 LAB — CBC WITH DIFFERENTIAL/PLATELET
Basophils Absolute: 0 10*3/uL (ref 0.0–0.1)
Basophils Relative: 0.7 % (ref 0.0–3.0)
Eosinophils Absolute: 0.3 10*3/uL (ref 0.0–0.7)
Eosinophils Relative: 4 % (ref 0.0–5.0)
HCT: 41.1 % (ref 39.0–52.0)
Hemoglobin: 13.7 g/dL (ref 13.0–17.0)
Lymphocytes Relative: 22 % (ref 12.0–46.0)
Lymphs Abs: 1.5 10*3/uL (ref 0.7–4.0)
MCHC: 33.2 g/dL (ref 30.0–36.0)
MCV: 86.7 fl (ref 78.0–100.0)
Monocytes Absolute: 0.5 10*3/uL (ref 0.1–1.0)
Monocytes Relative: 6.6 % (ref 3.0–12.0)
Neutro Abs: 4.7 10*3/uL (ref 1.4–7.7)
Neutrophils Relative %: 66.7 % (ref 43.0–77.0)
Platelets: 293 10*3/uL (ref 150.0–400.0)
RBC: 4.74 Mil/uL (ref 4.22–5.81)
RDW: 14.5 % (ref 11.5–15.5)
WBC: 7 10*3/uL (ref 4.0–10.5)

## 2020-08-15 LAB — HEPATIC FUNCTION PANEL
ALT: 42 U/L (ref 0–53)
AST: 71 U/L — ABNORMAL HIGH (ref 0–37)
Albumin: 4.2 g/dL (ref 3.5–5.2)
Alkaline Phosphatase: 108 U/L (ref 39–117)
Bilirubin, Direct: 0.1 mg/dL (ref 0.0–0.3)
Total Bilirubin: 0.4 mg/dL (ref 0.2–1.2)
Total Protein: 6.5 g/dL (ref 6.0–8.3)

## 2020-08-15 NOTE — Patient Instructions (Signed)
Please complete lab work prior to leaving and x-ray on the first floor.  Good luck with your MRI.

## 2020-08-15 NOTE — Progress Notes (Signed)
Subjective:    Patient ID: Stephen Evans, male    DOB: [birth year], 75 y.o.   MRN: [id number]  HPI  Patient is a 75 year old male who presents today for follow-up.  Since our last appointment he has undergone a Whipple procedure for stage 1 pancreatic cancer.  Most recently, he underwent a follow-up CT of the chest abdomen and pelvis.  Postsurgical changes of Whipple procedure were noted as well as new biliary ductal dilation with enhancement of the common bile duct and narrowing just before the choledocho jejunostomy with associated ill-defined density in this area.  It was felt that recurrent tumor could not be excluded.  MRI or short-term CT follow-up was suggested.  He is currently scheduled for an MRI MRCP with and without on Monday, January 31.  He reports that he has had some mild epigastric pain yesterday and today which is reminiscent of his previous pancreatitis episodes.    Left shoulder- notes pain with movement and decreased ROM.    Colonoscopy- states that he has completed cologuard along with annual IFOB's with his GI. He declines colonoscopy.  GERD- maintained on protonix.  Hx of melanoma- recently had follow up with his dermatologist for surveillance.  Hx of testicular cancer- followed by urology.  Review of Systems See HPI  Past Medical History:  Diagnosis Date  . Goals of care, counseling/discussion 05/28/2020  . Melanoma ([place]) x 2  . Melanoma ([place])   . Melanoma ([place])   . Pancreas cancer ([place]) 05/28/2020  . Pancreatitis 06/2018   thought to be due to alcohol  . Testicular cancer ([place]) 2011   testicular--radiation  . Testicular tumor      Social History   Socioeconomic History  . Marital status: Married    Spouse name: Not on file  . Number of children: 2  . Years of education: Not on file  . Highest education level: Not on file  Occupational History  . Occupation: retired    Comment: Arts development officer  Tobacco Use  . Smoking status: Former Smoker     Years: 30.00    Types: Cigarettes    Quit date: 09/06/1997    Years since quitting: 22.9  . Smokeless tobacco: Never Used  Vaping Use  . Vaping Use: Never used  Substance and Sexual Activity  . Alcohol use: Yes    Comment: 1 every 2 weeks  . Drug use: No  . Sexual activity: Not on file  Other Topics Concern  . Not on file  Social History Narrative   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[employer]   Married- second marriage x 24 yrs   62 yr old son- lives in [place], [place] manages a private hedge fund   17 yr old daughter- lives in [place], [place]   Enjoys golf   Social Determinants of Health   Financial Resource Strain: Not on file  Food Insecurity: Not on file  Transportation Needs: Not on file  Physical Activity: Not on file  Stress: Not on file  Social Connections: Not on file  Intimate Partner Violence: Not on file    Past Surgical History:  Procedure Laterality Date  . BIOPSY  01/18/2020   Procedure: BIOPSY;  Surgeon: Irving Copas., MD;  Location: [facility];  Service: Gastroenterology;;  . COLONOSCOPY  11/01/2003  . ESOPHAGOGASTRODUODENOSCOPY (EGD) WITH PROPOFOL N/A 01/18/2020   Procedure: ESOPHAGOGASTRODUODENOSCOPY (EGD) WITH PROPOFOL;  Surgeon: Rush Landmark [name], MD;  Location: [facility];  Service: Gastroenterology;  Laterality: N/A;  . EUS N/A 01/18/2020   Procedure: UPPER ENDOSCOPIC ULTRASOUND (EUS) LINEAR;  Surgeon: Irving Copas., MD;  Location: [facility];  Service: Gastroenterology;  Laterality: N/A;  . FINE NEEDLE ASPIRATION N/A 01/18/2020   Procedure: FINE NEEDLE ASPIRATION (FNA) LINEAR;  Surgeon: Irving Copas., MD;  Location: [facility];  Service: Gastroenterology;  Laterality: N/A;  . LYMPH GLAND EXCISION Right 1979   axillary, benign  . MELANOMA EXCISION Right 03/2014   elbow  . ORCHIECTOMY Right 2001   followed by radiation  . TONSILLECTOMY  1959  . wide  excision of melanoma on back  1979    Family History  Problem Relation Age of Onset  . Epilepsy Mother   . Esophageal cancer Father 87       smoking/drinking hx  . Breast cancer Sister 3  . Alcohol abuse Brother   . Cancer Maternal Uncle        unknown type; d. late 74s  . Testicular cancer Maternal Grandfather        unknown age  . Colon cancer Neg Hx   . Colon polyps Neg Hx   . Rectal cancer Neg Hx   . Stomach cancer Neg Hx   . Pancreatic cancer Neg Hx     Allergies  Allergen Reactions  . Prednisone     pancreatitis    Current Outpatient Medications on File Prior to Visit  Medication Sig Dispense Refill  . acetaminophen (TYLENOL) 500 MG tablet Take 500 mg by mouth every 8 (eight) hours as needed.    Marland Kitchen ibuprofen (ADVIL) 200 MG tablet Take 200 mg by mouth every 6 (six) hours as needed.    . lipase/protease/amylase (CREON) 36000 UNITS CPEP capsule Take 2 capsules (72,000 Units total) by mouth 3 (three) times daily with meals. May also take 1 capsule (36,000 Units total) as needed (with snacks). 240 capsule 11  . Multiple Vitamin (MULTIVITAMIN) tablet Take 1 tablet by mouth daily.    . pantoprazole (PROTONIX) 40 MG tablet Take 40 mg by mouth daily.    . Testosterone 100 MG PLLT Apply 1 application topically daily.     . valACYclovir (VALTREX) 1000 MG tablet Take 1,000 mg by mouth daily as needed (cold sores).      No current facility-administered medications on file prior to visit.    BP 135/70 (BP Location: Right Arm, Patient Position: Sitting, Cuff Size: Small)   Pulse 71   Temp 98.1 F (36.7 C) (Oral)   Resp 16   Ht 5\' 7"  (1.702 m)   Wt 186 lb (84.4 kg)   SpO2 100%   BMI 29.13 kg/m       Objective:   Physical Exam Constitutional:      General: He is not in acute distress.    Appearance: He is well-developed and well-nourished.  HENT:     Head: Normocephalic and atraumatic.  Cardiovascular:     Rate and Rhythm: Normal rate and regular rhythm.     Heart  sounds: No murmur heard.   Pulmonary:     Effort: Pulmonary effort is normal. No respiratory distress.     Breath sounds: Normal breath sounds. No wheezing or rales.  Abdominal:     General: Bowel sounds are normal.     Palpations: Abdomen is soft.     Tenderness: There is abdominal tenderness (mild epigastric tenderness without guarding).  Musculoskeletal:        General: No edema.  Comments: Decreased ROM with extension of left shoulder, no tenderness to palpation.    Skin:    General: Skin is warm and dry.  Neurological:     Mental Status: He is alert and oriented to person, place, and time.  Psychiatric:        Mood and Affect: Mood and affect normal.        Behavior: Behavior normal.        Thought Content: Thought content normal.           Assessment & Plan:  Epigastric pain- check CBC, LFT's.  Findings on his CT are certainly concerning for possibility of recurrent pancreatic cancer.  Thankfully his has an MRI/MRCP already scheduled.  If symptoms worsen, we may need to try to get this scheduled sooner.  Hx of pancreatic cancer- s/p whipple procedure. He opted against chemotherapy as he felt that the risks did not outweight the benefits for him.  He continues to follow with oncology (Dr. Marin Olp).  Left shoulder pain- Will obtain x-ray to look for significant arthritis. He is not interested in a big ortho work up or PT at this time due to his other health issues.    >40 minutes spent on today's visit. Time was spent reviewing outside records, interviewing/counselling patient and formulating medical plan.   This visit occurred during the SARS-CoV-2 public health emergency.  Safety protocols were in place, including screening questions prior to the visit, additional usage of staff PPE, and extensive cleaning of exam room while observing appropriate contact time as indicated for disinfecting solutions.

## 2020-08-16 ENCOUNTER — Encounter: Payer: Self-pay | Admitting: Family

## 2020-08-16 NOTE — Telephone Encounter (Signed)
Spoke with patient and let him know that I would reach out to Dr. Crisoforo Oxford to see if MRCP could be moved up. In the meantime- if he develops severe/worsening symptoms he should go to the ER. Pt verbalizes understanding.  I called [facility] line and left message for Dr. Crisoforo Oxford, who is in surgery, to contact me to discuss Stephen Evans.

## 2020-08-27 DIAGNOSIS — K838 Other specified diseases of biliary tract: Secondary | ICD-10-CM | POA: Diagnosis not present

## 2020-08-29 ENCOUNTER — Encounter: Payer: Self-pay | Admitting: Family

## 2020-08-29 ENCOUNTER — Encounter: Payer: Self-pay | Admitting: Hematology & Oncology

## 2020-08-29 DIAGNOSIS — M25512 Pain in left shoulder: Secondary | ICD-10-CM

## 2020-08-31 ENCOUNTER — Other Ambulatory Visit: Payer: Self-pay | Admitting: Hematology & Oncology

## 2020-08-31 DIAGNOSIS — C251 Malignant neoplasm of body of pancreas: Secondary | ICD-10-CM

## 2020-09-03 ENCOUNTER — Ambulatory Visit: Payer: Medicare HMO | Attending: Internal Medicine

## 2020-09-03 ENCOUNTER — Ambulatory Visit: Payer: Medicare HMO

## 2020-09-03 DIAGNOSIS — Z23 Encounter for immunization: Secondary | ICD-10-CM

## 2020-09-03 NOTE — Progress Notes (Signed)
   Covid-19 Vaccination Clinic  Name:  Stephen Evans    MRN: [id number] DOB: 1946-03-17  09/03/2020  Stephen Evans was observed post Covid-19 immunization for 15 minutes without incident. He was provided with Vaccine Information Sheet and instruction to access the V-Safe system.   Stephen Evans was instructed to call 911 with any severe reactions post vaccine: Marland Kitchen Difficulty breathing  . Swelling of face and throat  . A fast heartbeat  . A bad rash all over body  . Dizziness and weakness   Immunizations Administered    Name Date Dose VIS Date Route   PFIZER Comrnaty(Gray TOP) Covid-19 Vaccine 09/03/2020  1:49 PM 0.3 mL 07/05/2020 Intramuscular   Manufacturer: [employer]   Lot: [id number]   NDC: [phone]

## 2020-09-04 MED FILL — PFIZER-BIONT COVID-19 VAC-T: 30 | 21 days supply | Qty: 2 | Fill #0

## 2020-09-12 DIAGNOSIS — Z20822 Contact with and (suspected) exposure to covid-19: Secondary | ICD-10-CM | POA: Diagnosis not present

## 2020-09-28 DIAGNOSIS — M25512 Pain in left shoulder: Secondary | ICD-10-CM | POA: Diagnosis not present

## 2020-10-04 ENCOUNTER — Encounter: Payer: Self-pay | Admitting: Hematology & Oncology

## 2020-10-04 ENCOUNTER — Ambulatory Visit (HOSPITAL_BASED_OUTPATIENT_CLINIC_OR_DEPARTMENT_OTHER)
Admission: RE | Admit: 2020-10-04 | Discharge: 2020-10-04 | Disposition: A | Discharge status: Home / Self Care | Payer: Medicare HMO | Source: Ambulatory Visit | Attending: Hematology & Oncology | Admitting: Hematology & Oncology

## 2020-10-04 ENCOUNTER — Other Ambulatory Visit: Payer: Self-pay | Admitting: *Deleted

## 2020-10-04 ENCOUNTER — Telehealth: Payer: Self-pay | Admitting: *Deleted

## 2020-10-04 ENCOUNTER — Other Ambulatory Visit: Payer: Self-pay

## 2020-10-04 ENCOUNTER — Inpatient Hospital Stay (HOSPITAL_BASED_OUTPATIENT_CLINIC_OR_DEPARTMENT_OTHER): Payer: Medicare HMO | Admitting: Hematology & Oncology

## 2020-10-04 ENCOUNTER — Inpatient Hospital Stay: Payer: Medicare HMO | Attending: Hematology & Oncology

## 2020-10-04 VITALS — BP 103/63 | HR 107 | Temp 99.7°F | Resp 18 | Wt 189.0 lb

## 2020-10-04 DIAGNOSIS — C251 Malignant neoplasm of body of pancreas: Secondary | ICD-10-CM

## 2020-10-04 DIAGNOSIS — R7989 Other specified abnormal findings of blood chemistry: Secondary | ICD-10-CM | POA: Diagnosis not present

## 2020-10-04 DIAGNOSIS — Z803 Family history of malignant neoplasm of breast: Secondary | ICD-10-CM

## 2020-10-04 DIAGNOSIS — R109 Unspecified abdominal pain: Secondary | ICD-10-CM | POA: Diagnosis not present

## 2020-10-04 DIAGNOSIS — Z85820 Personal history of malignant melanoma of skin: Secondary | ICD-10-CM

## 2020-10-04 LAB — CMP (CANCER CENTER ONLY)
ALT: 349 U/L (ref 0–44)
AST: 442 U/L (ref 15–41)
Albumin: 4.5 g/dL (ref 3.5–5.0)
Alkaline Phosphatase: 146 U/L — ABNORMAL HIGH (ref 38–126)
Anion gap: 6 (ref 5–15)
BUN: 14 mg/dL (ref 8–23)
CO2: 29 mmol/L (ref 22–32)
Calcium: 10 mg/dL (ref 8.9–10.3)
Chloride: 102 mmol/L (ref 98–111)
Creatinine: 0.93 mg/dL (ref 0.61–1.24)
GFR, Estimated: >60 mL/min (ref >60)
Glucose, Bld: 117 mg/dL — ABNORMAL HIGH (ref 70–99)
Potassium: 4.6 mmol/L (ref 3.5–5.1)
Sodium: 137 mmol/L (ref 135–145)
Total Bilirubin: 1.9 mg/dL — ABNORMAL HIGH (ref 0.3–1.2)
Total Protein: 7.1 g/dL (ref 6.5–8.1)

## 2020-10-04 LAB — CBC WITH DIFFERENTIAL (CANCER CENTER ONLY)
Abs Immature Granulocytes: 0.02 10*3/uL (ref 0.00–0.07)
Basophils Absolute: 0 10*3/uL (ref 0.0–0.1)
Basophils Relative: 0 %
Eosinophils Absolute: 0.1 10*3/uL (ref 0.0–0.5)
Eosinophils Relative: 1 %
HCT: 46 % (ref 39.0–52.0)
Hemoglobin: 15.3 g/dL (ref 13.0–17.0)
Immature Granulocytes: 0 %
Lymphocytes Relative: 7 %
Lymphs Abs: 0.7 10*3/uL (ref 0.7–4.0)
MCH: 28.8 pg (ref 26.0–34.0)
MCHC: 33.3 g/dL (ref 30.0–36.0)
MCV: 86.6 fL (ref 80.0–100.0)
Monocytes Absolute: 0.6 10*3/uL (ref 0.1–1.0)
Monocytes Relative: 7 %
Neutro Abs: 8.2 10*3/uL — ABNORMAL HIGH (ref 1.7–7.7)
Neutrophils Relative %: 85 %
Platelet Count: 249 10*3/uL (ref 150–400)
RBC: 5.31 MIL/uL (ref 4.22–5.81)
RDW: 13.3 % (ref 11.5–15.5)
WBC Count: 9.6 10*3/uL (ref 4.0–10.5)
nRBC: 0 % (ref 0.0–0.2)

## 2020-10-04 LAB — AMYLASE: Amylase: 45 U/L (ref 28–100)

## 2020-10-04 LAB — LIPASE, BLOOD: Lipase: 30 U/L (ref 11–51)

## 2020-10-04 MED ORDER — IOHEXOL 300 MG/ML  SOLN
100.0000 mL | Freq: Once | INTRAMUSCULAR | Status: AC | PRN
  Administered 2020-10-04: 100 mL via INTRAVENOUS

## 2020-10-04 MED ORDER — AMOXICILLIN-POT CLAVULANATE 875-125 MG PO TABS: 1.0000 | ORAL_TABLET | Freq: Two times a day (BID) | ORAL | 0 refills | Status: DC

## 2020-10-04 MED ORDER — CIPROFLOXACIN HCL 500 MG PO TABS: 500.0000 mg | ORAL_TABLET | Freq: Two times a day (BID) | ORAL | 0 refills | Status: DC

## 2020-10-04 NOTE — Telephone Encounter (Signed)
Dr. Marin Olp notified of AST-442 and ALT-349. Order received from Dr. Marin Olp to add Amylase and Lipase on to patient's labs from today and for pt to have a stat CT of the abdomen/pelvis.

## 2020-10-04 NOTE — Progress Notes (Signed)
Hematology and Oncology Follow Up Visit  KEKOA FYOCK [id number] [birth year] 75 y.o. 10/04/2020   Principle Diagnosis:   Stage Ia (T1cN0M0) adenocarcinoma of the pancreas-pancreas body  Current Therapy:    Status post Whipple procedure on 04/11/2020     Interim History:  Mr. Oyama is back for a scheduled visit.  He called the office this morning.  He began having right upper quadrant abdominal pain yesterday.  He had some chills.  He had no associated diarrhea.  He had no urinary issues.  He had no vomiting.  He may have had a little bit of nausea.  We got him in.  His lab work does show his LFTs are elevated.  The bilirubin was 1.9.  His SGPT was 349 SGOT 442.  Alkaline phosphatase 46.  I thought that he may have had pancreatitis.  However, the amylase and lipase were both normal.  We did get a CT scan on him.  The report came back with nothing that looked significant.  There is no obvious malignancy.  He did have a low-grade dilatation from the Whipple procedure.  There is no obvious liver inflammation or infiltration.  He had no adenopathy.  I did speak with his surgeon, Dr. Jyl Heinz, who actually was in [place] for a conference.  I updated him with what was going on.  Mr. Candace Cruise had not traveled anywhere.  Had not eaten anything unusual.  He really does not drink alcohol.  He had no obvious fever.  In the office he did have a temperature of 100.8.  There is been no bleeding.  He had no rash.  He had no cough or shortness of breath.  Overall, I would say his performance status is ECOG 1.   Medications:  Current Outpatient Medications:  .  acetaminophen (TYLENOL) 500 MG tablet, Take 500 mg by mouth every 8 (eight) hours as needed., Disp: , Rfl:  .  ibuprofen (ADVIL) 200 MG tablet, Take 200 mg by mouth every 6 (six) hours as needed., Disp: , Rfl:  .  Multiple Vitamin (MULTIVITAMIN) tablet, Take 1 tablet by mouth daily., Disp: , Rfl:  .  pantoprazole (PROTONIX) 40 MG tablet, Take  40 mg by mouth daily., Disp: , Rfl:  .  Testosterone 100 MG PLLT, Apply 1 application topically daily. , Disp: , Rfl:  .  valACYclovir (VALTREX) 1000 MG tablet, Take 1,000 mg by mouth daily as needed (cold sores). , Disp: , Rfl:   Allergies:  Allergies  Allergen Reactions  . Prednisone Other (See Comments)    pancreatitis pancreatitis    Past Medical History, Surgical history, Social history, and Family History were reviewed and updated.  Review of Systems: Review of Systems  Constitutional: Negative.   HENT:  Negative.   Eyes: Negative.   Respiratory: Negative.   Cardiovascular: Negative.   Gastrointestinal: Positive for abdominal pain.  Endocrine: Negative.   Genitourinary: Negative.    Musculoskeletal: Negative.   Skin: Negative.   Neurological: Negative.   Hematological: Negative.   Psychiatric/Behavioral: Negative.     Physical Exam:  weight is 189 lb (85.7 kg). His oral temperature is 99.7 F (37.6 C). His blood pressure is 103/63 and his pulse is 107 (abnormal). His respiration is 18 and oxygen saturation is 98%.   Wt Readings from Last 3 Encounters:  10/04/20 189 lb (85.7 kg)  08/15/20 186 lb (84.4 kg)  07/25/20 184 lb (83.5 kg)    Physical Exam Vitals reviewed.  HENT:  Head: Normocephalic and atraumatic.  Eyes:     Pupils: Pupils are equal, round, and reactive to light.  Cardiovascular:     Rate and Rhythm: Normal rate and regular rhythm.     Heart sounds: Normal heart sounds.  Pulmonary:     Effort: Pulmonary effort is normal.     Breath sounds: Normal breath sounds.  Abdominal:     General: Bowel sounds are normal.     Palpations: Abdomen is soft.     Comments: Abdominal exam shows a soft abdomen.  Bowel sounds are present.  Has well healed laparotomy scar in the midline.  He does not have a keloid formation.  There is no fluid wave.  He has little bit of tenderness throughout the abdomen to palpation.  He has no rebound tenderness.  There is no  obvious fluid wave.  I cannot palpate his liver or spleen tip.  Musculoskeletal:        General: No tenderness or deformity. Normal range of motion.     Cervical back: Normal range of motion.  Lymphadenopathy:     Cervical: No cervical adenopathy.  Skin:    General: Skin is warm and dry.     Findings: No erythema or rash.  Neurological:     Mental Status: He is alert and oriented to person, place, and time.  Psychiatric:        Behavior: Behavior normal.        Thought Content: Thought content normal.        Judgment: Judgment normal.      Lab Results  Component Value Date   WBC 9.6 10/04/2020   HGB 15.3 10/04/2020   HCT 46.0 10/04/2020   MCV 86.6 10/04/2020   PLT 249 10/04/2020     Chemistry      Component Value Date/Time   NA 137 10/04/2020 1124   K 4.6 10/04/2020 1124   CL 102 10/04/2020 1124   CO2 29 10/04/2020 1124   BUN 14 10/04/2020 1124   CREATININE 0.93 10/04/2020 1124   CREATININE 0.90 05/21/2018 1606      Component Value Date/Time   CALCIUM 10.0 10/04/2020 1124   ALKPHOS 146 (H) 10/04/2020 1124   AST 442 (HH) 10/04/2020 1124   ALT 349 (HH) 10/04/2020 1124   BILITOT 1.9 (H) 10/04/2020 1124      Impression and Plan: Mr. Milliron is a very nice 75 year old white male.  Again is very interesting to talk to.  He is originally from [place].  He worked for [employer].  He was in the [employer].  He had early stage pancreatic cancer.  He underwent a Whipple procedure in September 2021.  I have to believe that this stricture is a problem.  I know that his CT scan not show anything that was obvious.  However, I have no other explanation for the elevated liver function studies and bilirubin.  He does not look like he has cholangitis.  However, I will go ahead and get him on some antibiotics just to be cautious.  I will start him on ciprofloxacin and Augmentin.  I think both of these should be adequate for coverage of bacteria that are in the biliary system.  I told him to go  easy on food right now.  Hopefully, he will go light on the proteins and fatty foods.  Dr. Crisoforo Oxford will call his office and get Mr. Bramble seen, looks like early next week.  We will work on getting the scans sent over to  Dr. Lyda Jester office.  I do think Mr. Villarruel needs IV fluids.  Again he looked pretty healthy.  He did not look dehydrated.  We did check his temperature before he left and he was afebrile.  This is somewhat tenuous.  We will follow up with Mr. Diop in the morning and see how he is feeling.   [name], MD 3/10/202212:41 PM

## 2020-10-05 ENCOUNTER — Inpatient Hospital Stay (HOSPITAL_BASED_OUTPATIENT_CLINIC_OR_DEPARTMENT_OTHER): Admission: RE | Admit: 2020-10-05 | Payer: Medicare HMO | Source: Ambulatory Visit

## 2020-10-05 ENCOUNTER — Telehealth: Payer: Self-pay | Admitting: *Deleted

## 2020-10-05 LAB — CANCER ANTIGEN 19-9: CA 19-9: 67 U/mL — ABNORMAL HIGH (ref 0–35)

## 2020-10-05 NOTE — Telephone Encounter (Signed)
Call placed to check on patient per order of Dr. Marin Olp.  Patient states that he had vomiting yesterday evening after taking antibiotics, but has had no N/V since then.  He states that he is "95% better" from yesterday and has no complaints at this time.  Dr. Marin Olp notified.

## 2020-10-08 ENCOUNTER — Encounter: Payer: Self-pay | Admitting: Hematology & Oncology

## 2020-10-11 ENCOUNTER — Encounter: Payer: Self-pay | Admitting: Hematology & Oncology

## 2020-10-12 ENCOUNTER — Encounter: Payer: Self-pay | Admitting: Hematology & Oncology

## 2020-10-22 DIAGNOSIS — K8051 Calculus of bile duct without cholangitis or cholecystitis with obstruction: Secondary | ICD-10-CM | POA: Diagnosis not present

## 2020-10-22 DIAGNOSIS — Z8719 Personal history of other diseases of the digestive system: Secondary | ICD-10-CM | POA: Diagnosis not present

## 2020-10-22 DIAGNOSIS — Z9889 Other specified postprocedural states: Secondary | ICD-10-CM | POA: Diagnosis not present

## 2020-10-22 DIAGNOSIS — Z90410 Acquired total absence of pancreas: Secondary | ICD-10-CM | POA: Diagnosis not present

## 2020-10-22 DIAGNOSIS — K831 Obstruction of bile duct: Secondary | ICD-10-CM | POA: Diagnosis not present

## 2020-10-22 DIAGNOSIS — K9189 Other postprocedural complications and disorders of digestive system: Secondary | ICD-10-CM | POA: Diagnosis not present

## 2020-10-22 DIAGNOSIS — C61 Malignant neoplasm of prostate: Secondary | ICD-10-CM | POA: Diagnosis not present

## 2020-10-23 DIAGNOSIS — K8051 Calculus of bile duct without cholangitis or cholecystitis with obstruction: Secondary | ICD-10-CM | POA: Diagnosis not present

## 2020-10-23 DIAGNOSIS — C61 Malignant neoplasm of prostate: Secondary | ICD-10-CM | POA: Diagnosis not present

## 2020-10-23 DIAGNOSIS — Z8719 Personal history of other diseases of the digestive system: Secondary | ICD-10-CM | POA: Diagnosis not present

## 2020-10-23 DIAGNOSIS — K9189 Other postprocedural complications and disorders of digestive system: Secondary | ICD-10-CM | POA: Diagnosis not present

## 2020-10-23 DIAGNOSIS — K831 Obstruction of bile duct: Secondary | ICD-10-CM | POA: Diagnosis not present

## 2020-10-23 DIAGNOSIS — Z9889 Other specified postprocedural states: Secondary | ICD-10-CM | POA: Diagnosis not present

## 2020-10-24 ENCOUNTER — Other Ambulatory Visit: Payer: Medicare HMO

## 2020-10-24 ENCOUNTER — Ambulatory Visit: Payer: Medicare HMO | Admitting: Hematology & Oncology

## 2020-11-05 DIAGNOSIS — Z20822 Contact with and (suspected) exposure to covid-19: Secondary | ICD-10-CM | POA: Diagnosis not present

## 2020-11-17 ENCOUNTER — Encounter: Payer: Self-pay | Admitting: Hematology & Oncology

## 2020-11-19 ENCOUNTER — Telehealth: Payer: Self-pay

## 2020-11-19 NOTE — Telephone Encounter (Signed)
S/w pt and he is aware of his appt per sch/staff message  Webb Silversmith

## 2020-11-23 ENCOUNTER — Other Ambulatory Visit (INDEPENDENT_AMBULATORY_CARE_PROVIDER_SITE_OTHER): Payer: Medicare HMO

## 2020-11-23 ENCOUNTER — Other Ambulatory Visit: Payer: Self-pay

## 2020-11-23 ENCOUNTER — Encounter: Payer: Self-pay | Admitting: Family

## 2020-11-23 DIAGNOSIS — N390 Urinary tract infection, site not specified: Secondary | ICD-10-CM

## 2020-11-23 MED ORDER — CEPHALEXIN 500 MG PO CAPS: 500.0000 mg | ORAL_CAPSULE | Freq: Three times a day (TID) | ORAL | 0 refills | Status: DC

## 2020-11-23 NOTE — Telephone Encounter (Signed)
Patient advised of new prescription and scheduled to come in today to provide urine for culture.

## 2020-11-23 NOTE — Telephone Encounter (Signed)
I will send rx to his pharmacy, but I would like him to stop by the pharmacy to provide a urine specimen for urine culture- dx UTI.

## 2020-11-25 LAB — URINE CULTURE
MICRO NUMBER:: 11831607
SPECIMEN QUALITY:: ADEQUATE

## 2020-11-27 ENCOUNTER — Ambulatory Visit (INDEPENDENT_AMBULATORY_CARE_PROVIDER_SITE_OTHER): Payer: Medicare HMO | Admitting: Family

## 2020-11-27 ENCOUNTER — Other Ambulatory Visit: Payer: Self-pay

## 2020-11-27 ENCOUNTER — Encounter: Payer: Self-pay | Admitting: Family

## 2020-11-27 VITALS — BP 130/67 | HR 79 | Temp 98.6°F | Resp 16 | Ht 67.0 in | Wt 180.0 lb

## 2020-11-27 DIAGNOSIS — N3000 Acute cystitis without hematuria: Secondary | ICD-10-CM | POA: Insufficient documentation

## 2020-11-27 DIAGNOSIS — K831 Obstruction of bile duct: Secondary | ICD-10-CM

## 2020-11-27 DIAGNOSIS — C251 Malignant neoplasm of body of pancreas: Secondary | ICD-10-CM | POA: Diagnosis not present

## 2020-11-27 LAB — HEPATIC FUNCTION PANEL
ALT: 14 U/L (ref 0–53)
AST: 11 U/L (ref 0–37)
Albumin: 4.2 g/dL (ref 3.5–5.2)
Alkaline Phosphatase: 84 U/L (ref 39–117)
Bilirubin, Direct: 0.1 mg/dL (ref 0.0–0.3)
Total Bilirubin: 0.6 mg/dL (ref 0.2–1.2)
Total Protein: 6.7 g/dL (ref 6.0–8.3)

## 2020-11-27 MED ORDER — CEPHALEXIN 500 MG PO CAPS: 500.0000 mg | ORAL_CAPSULE | Freq: Three times a day (TID) | ORAL | 0 refills | Status: DC

## 2020-11-27 NOTE — Progress Notes (Signed)
Established Patient Office Visit  Subjective:  Patient ID: Stephen Evans, male    DOB: [birth year]  Age: 75 y.o. MRN: [id number]  CC:  Chief Complaint  Patient presents with  . Follow-up    HPI Stephen Evans presents for follow up.   Pancreatic Cancer- he had lab work on 10/04/20.  LFT's elevated.  Repeat LFT's were WNL on 10/22/20. He underwent ERCP 10/22/20 and was treated with balloon dilation/plastic biliary stent placement.  Plan is for repeat ERCP in the end of June.   UTI-  Recent urine culture + for pseudomonas. He reports that he is feeling better. Today is is last day of abx. Still having some sharp pain at the end of urination. He had a UTI in the hospital post op with urinary retention. He sees urology later this month.  He sees Dr. Jeffie Pollock.     Past Medical History:  Diagnosis Date  . Goals of care, counseling/discussion 05/28/2020  . Melanoma ([place]) x 2  . Melanoma ([place])   . Melanoma ([place])   . Pancreas cancer ([place]) 05/28/2020  . Pancreatitis 06/2018   thought to be due to alcohol  . Testicular cancer ([place]) 2011   testicular--radiation  . Testicular tumor     Past Surgical History:  Procedure Laterality Date  . BIOPSY  01/18/2020   Procedure: BIOPSY;  Surgeon: Irving Copas., MD;  Location: [facility];  Service: Gastroenterology;;  . COLONOSCOPY  11/01/2003  . ESOPHAGOGASTRODUODENOSCOPY (EGD) WITH PROPOFOL N/A 01/18/2020   Procedure: ESOPHAGOGASTRODUODENOSCOPY (EGD) WITH PROPOFOL;  Surgeon: Rush Landmark [name], MD;  Location: [facility];  Service: Gastroenterology;  Laterality: N/A;  . EUS N/A 01/18/2020   Procedure: UPPER ENDOSCOPIC ULTRASOUND (EUS) LINEAR;  Surgeon: Irving Copas., MD;  [facility]: [facility];  Service: Gastroenterology;  Laterality: N/A;  . FINE NEEDLE ASPIRATION N/A 01/18/2020   Procedure: FINE NEEDLE ASPIRATION (FNA) LINEAR;  Surgeon: Irving Copas., MD;  Location: [facility];  Service: Gastroenterology;   Laterality: N/A;  . LYMPH GLAND EXCISION Right 1979   axillary, benign  . MELANOMA EXCISION Right 03/2014   elbow  . ORCHIECTOMY Right 2001   followed by radiation  . TONSILLECTOMY  1959  . wide excision of melanoma on back  1979    Family History  Problem Relation Age of Onset  . Epilepsy Mother   . Esophageal cancer Father 58       smoking/drinking hx  . Breast cancer Sister 52  . Alcohol abuse Brother   . Cancer Maternal Uncle        unknown type; d. late 86s  . Testicular cancer Maternal Grandfather        unknown age  . Colon cancer Neg Hx   . Colon polyps Neg Hx   . Rectal cancer Neg Hx   . Stomach cancer Neg Hx   . Pancreatic cancer Neg Hx     Social History   Socioeconomic History  . Marital status: Married    Spouse name: Not on file  . Number of children: 2  . Years of education: Not on file  . Highest education level: Not on file  Occupational History  . Occupation: retired    Comment: Arts development officer  Tobacco Use  . Smoking status: Former Smoker    Years: 30.00    Types: Cigarettes    Quit date: 09/06/1997    Years since quitting: 23.2  . Smokeless tobacco: Never Used  Vaping Use  . Vaping Use: Never  used  Substance and Sexual Activity  . Alcohol use: Yes    Comment: 1 every 2 weeks  . Drug use: No  . Sexual activity: Not on file  Other Topics Concern  . 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 acetaminophen (TYLENOL) 500 MG tablet Take 500 mg by mouth every 8 (eight) hours as needed.    . cephALEXin (KEFLEX) 500 MG capsule Take 1 capsule (500 mg total) by mouth 3 (three) times daily. 15 capsule 0  . COVID-19 mRNA Vac-TriS, Pfizer, SUSP injection TO BE ADMINISTERED .3 mL 0  . ibuprofen (ADVIL) 200 MG tablet Take 200 mg by mouth every 6 (six) hours as needed.    . Multiple Vitamin (MULTIVITAMIN) tablet Take 1 tablet by mouth daily.    . pantoprazole (PROTONIX) 40 MG tablet Take 40 mg by mouth daily.    . Testosterone 100 MG PLLT Apply 1 application topically daily.     . valACYclovir (VALTREX) 1000 MG tablet Take 1,000 mg by mouth daily as needed (cold sores).      No facility-administered medications prior to visit.    Allergies  Allergen Reactions  . Prednisone Other (See Comments)    pancreatitis pancreatitis    ROS Review of Systems    Objective:    Physical Exam Constitutional:      General: He is not in acute distress.    Appearance: He is well-developed.  HENT:     Head: Normocephalic and atraumatic.  Cardiovascular:     Rate and Rhythm: Normal rate and regular rhythm.     Heart sounds: No murmur heard.   Pulmonary:     Effort: Pulmonary effort is normal. No respiratory distress.     Breath sounds: Normal breath sounds. No wheezing or rales.  Skin:    General: Skin is warm and dry.  Neurological:     Mental Status: He is alert and oriented to person, place, and time.  Psychiatric:        Behavior: Behavior normal.        Thought Content: Thought content normal.     BP 130/67 (BP Location: Right Arm, Patient Position: Sitting, Cuff Size: Small)   Pulse 79   Temp 98.6 F (37 C) (Oral)   Resp 16   Ht 5\' 7"  (1.702 m)   Wt 180 lb (81.6 kg)   SpO2 100%   BMI 28.19 kg/m  Wt Readings from Last 3 Encounters:  11/27/20 180 lb (81.6 kg)  10/04/20 189 lb (85.7 kg)  08/15/20 186 lb (84.4  kg)     Health Maintenance Due  Topic Date Due  . Hepatitis C Screening  Never done    There are no preventive care reminders to display for this patient.  Lab Results  Component Value Date  TSH 1.74 11/15/2018   Lab Results  Component Value Date   WBC 9.6 10/04/2020   HGB 15.3 10/04/2020   HCT 46.0 10/04/2020   MCV 86.6 10/04/2020   PLT 249 10/04/2020   Lab Results  Component Value Date   NA 137 10/04/2020   K 4.6 10/04/2020   CO2 29 10/04/2020   GLUCOSE 117 (H) 10/04/2020   BUN 14 10/04/2020   CREATININE 0.93 10/04/2020   BILITOT 1.9 (H) 10/04/2020   ALKPHOS 146 (H) 10/04/2020   AST 442 (HH) 10/04/2020   ALT 349 (HH) 10/04/2020   PROT 7.1 10/04/2020   ALBUMIN 4.5 10/04/2020   CALCIUM 10.0 10/04/2020   ANIONGAP 6 10/04/2020   GFR 86.33 01/23/2020   Lab Results  Component Value Date   CHOL 188 08/16/2014   Lab Results  Component Value Date   HDL 62.30 08/16/2014   Lab Results  Component Value Date   LDLCALC 99 08/16/2014   Lab Results  Component Value Date   TRIG 28 07/21/2018   Lab Results  Component Value Date   CHOLHDL 3 08/16/2014   No results found for: HGBA1C    Assessment & Plan:   Problem List Items Addressed This Visit   None     No orders of the defined types were placed in this encounter.   Follow-up: No follow-ups on file.    Nance Pear, NP

## 2020-11-27 NOTE — Assessment & Plan Note (Signed)
S/p Whipple procedure. He continues to follow with oncology.

## 2020-11-27 NOTE — Assessment & Plan Note (Signed)
Clinically improving but not completely resolved.  Will extend keflex 500mg  tid for an additional 2 days to complete a full 7 day course. I advised pt to follow up with his urologist to discuss possibility of adding flomax.

## 2020-11-27 NOTE — Assessment & Plan Note (Signed)
S/p stending. Clinically stable. Obtain follow up lft.

## 2020-12-12 DIAGNOSIS — N401 Enlarged prostate with lower urinary tract symptoms: Secondary | ICD-10-CM | POA: Diagnosis not present

## 2020-12-12 DIAGNOSIS — E291 Testicular hypofunction: Secondary | ICD-10-CM | POA: Diagnosis not present

## 2020-12-17 DIAGNOSIS — R972 Elevated prostate specific antigen [PSA]: Secondary | ICD-10-CM | POA: Diagnosis not present

## 2020-12-17 DIAGNOSIS — R351 Nocturia: Secondary | ICD-10-CM | POA: Diagnosis not present

## 2020-12-17 DIAGNOSIS — N410 Acute prostatitis: Secondary | ICD-10-CM | POA: Diagnosis not present

## 2020-12-17 DIAGNOSIS — E291 Testicular hypofunction: Secondary | ICD-10-CM | POA: Diagnosis not present

## 2020-12-17 DIAGNOSIS — N401 Enlarged prostate with lower urinary tract symptoms: Secondary | ICD-10-CM | POA: Diagnosis not present

## 2021-01-02 DIAGNOSIS — N410 Acute prostatitis: Secondary | ICD-10-CM | POA: Diagnosis not present

## 2021-01-02 DIAGNOSIS — E291 Testicular hypofunction: Secondary | ICD-10-CM | POA: Diagnosis not present

## 2021-01-02 DIAGNOSIS — N401 Enlarged prostate with lower urinary tract symptoms: Secondary | ICD-10-CM | POA: Diagnosis not present

## 2021-01-02 DIAGNOSIS — R972 Elevated prostate specific antigen [PSA]: Secondary | ICD-10-CM | POA: Diagnosis not present

## 2021-01-07 DIAGNOSIS — K9189 Other postprocedural complications and disorders of digestive system: Secondary | ICD-10-CM | POA: Diagnosis not present

## 2021-01-07 DIAGNOSIS — K831 Obstruction of bile duct: Secondary | ICD-10-CM | POA: Diagnosis not present

## 2021-01-07 DIAGNOSIS — Z4689 Encounter for fitting and adjustment of other specified devices: Secondary | ICD-10-CM | POA: Diagnosis not present

## 2021-01-07 DIAGNOSIS — C259 Malignant neoplasm of pancreas, unspecified: Secondary | ICD-10-CM | POA: Diagnosis not present

## 2021-01-07 DIAGNOSIS — K862 Cyst of pancreas: Secondary | ICD-10-CM | POA: Diagnosis not present

## 2021-01-07 DIAGNOSIS — Z9049 Acquired absence of other specified parts of digestive tract: Secondary | ICD-10-CM | POA: Diagnosis not present

## 2021-01-23 ENCOUNTER — Telehealth: Payer: Self-pay

## 2021-01-23 ENCOUNTER — Inpatient Hospital Stay: Payer: Medicare HMO | Attending: Hematology & Oncology

## 2021-01-23 ENCOUNTER — Inpatient Hospital Stay: Payer: Medicare HMO | Admitting: Hematology & Oncology

## 2021-01-23 ENCOUNTER — Other Ambulatory Visit: Payer: Self-pay

## 2021-01-23 ENCOUNTER — Encounter: Payer: Self-pay | Admitting: Hematology & Oncology

## 2021-01-23 VITALS — BP 144/65 | HR 63 | Temp 98.8°F | Resp 20 | Wt 191.0 lb

## 2021-01-23 DIAGNOSIS — C253 Malignant neoplasm of pancreatic duct: Secondary | ICD-10-CM

## 2021-01-23 DIAGNOSIS — C251 Malignant neoplasm of body of pancreas: Secondary | ICD-10-CM | POA: Insufficient documentation

## 2021-01-23 DIAGNOSIS — R972 Elevated prostate specific antigen [PSA]: Secondary | ICD-10-CM | POA: Diagnosis not present

## 2021-01-23 LAB — CBC WITH DIFFERENTIAL (CANCER CENTER ONLY)
Abs Immature Granulocytes: 0.02 10*3/uL (ref 0.00–0.07)
Basophils Absolute: 0.1 10*3/uL (ref 0.0–0.1)
Basophils Relative: 1 %
Eosinophils Absolute: 0.3 10*3/uL (ref 0.0–0.5)
Eosinophils Relative: 4 %
HCT: 42.7 % (ref 39.0–52.0)
Hemoglobin: 14.2 g/dL (ref 13.0–17.0)
Immature Granulocytes: 0 %
Lymphocytes Relative: 26 %
Lymphs Abs: 1.7 10*3/uL (ref 0.7–4.0)
MCH: 30 pg (ref 26.0–34.0)
MCHC: 33.3 g/dL (ref 30.0–36.0)
MCV: 90.3 fL (ref 80.0–100.0)
Monocytes Absolute: 0.4 10*3/uL (ref 0.1–1.0)
Monocytes Relative: 6 %
Neutro Abs: 4 10*3/uL (ref 1.7–7.7)
Neutrophils Relative %: 63 %
Platelet Count: 236 10*3/uL (ref 150–400)
RBC: 4.73 MIL/uL (ref 4.22–5.81)
RDW: 12.7 % (ref 11.5–15.5)
WBC Count: 6.4 10*3/uL (ref 4.0–10.5)
nRBC: 0 % (ref 0.0–0.2)

## 2021-01-23 LAB — CMP (CANCER CENTER ONLY)
ALT: 11 U/L (ref 0–44)
AST: 11 U/L — ABNORMAL LOW (ref 15–41)
Albumin: 4.2 g/dL (ref 3.5–5.0)
Alkaline Phosphatase: 83 U/L (ref 38–126)
Anion gap: 6 (ref 5–15)
BUN: 19 mg/dL (ref 8–23)
CO2: 30 mmol/L (ref 22–32)
Calcium: 9.5 mg/dL (ref 8.9–10.3)
Chloride: 103 mmol/L (ref 98–111)
Creatinine: 0.86 mg/dL (ref 0.61–1.24)
GFR, Estimated: >60 mL/min (ref >60)
Glucose, Bld: 101 mg/dL — ABNORMAL HIGH (ref 70–99)
Potassium: 4.3 mmol/L (ref 3.5–5.1)
Sodium: 139 mmol/L (ref 135–145)
Total Bilirubin: 0.5 mg/dL (ref 0.3–1.2)
Total Protein: 6.5 g/dL (ref 6.5–8.1)

## 2021-01-23 LAB — LIPASE, BLOOD: Lipase: 29 U/L (ref 11–51)

## 2021-01-23 LAB — AMYLASE: Amylase: 56 U/L (ref 28–100)

## 2021-01-23 NOTE — Progress Notes (Signed)
Hematology and Oncology Follow Up Visit  AMY BELLOSO [id number] [birth year] 75 y.o. 01/23/2021   Principle Diagnosis:  Stage Ia (T1cN0M0) adenocarcinoma of the pancreas-pancreas body  Current Therapy:   Status post Whipple procedure on 04/11/2020     Interim History:  Mr. Bollard is back for follow-up.  He has been followed at [facility]  He has been seen by Dr. Jyl Heinz.  So far, he has not yet had his most recent CT scan.  The insurance company keeps putting this off.  Hopefully, he will have this done in July.  He recently had his biliary stent removed.  We last saw him, he had marked LFT elevations.  He had a biliary stricture.  [facility] did a great job in putting the stent in.  This was done nothing back in March.  He had the stent removed a couple days ago.  His biliary and liver levels look fine.  He feels well.  He and his wife will be going to [place] to watch the [employer].  There will be there for 11 days.  They will have a fantastic time.  Overall, he has had no problems with nausea or vomiting.  He has had no problems with bowels or bladder.  There has been no problems with cough or shortness of breath.  Overall, his performance status right now is ECOG 0.   .   Medications:  Current Outpatient Medications:    Multiple Vitamin (MULTIVITAMIN) tablet, Take 1 tablet by mouth daily., Disp: , Rfl:    pantoprazole (PROTONIX) 40 MG tablet, Take 40 mg by mouth daily., Disp: , Rfl:    polyethylene glycol (MIRALAX / GLYCOLAX) 17 g packet, Take 17 g by mouth daily as needed., Disp: , Rfl:    Testosterone 100 MG PLLT, Apply 1 application topically daily. , Disp: , Rfl:    valACYclovir (VALTREX) 1000 MG tablet, Take 1,000 mg by mouth daily as needed (cold sores). , Disp: , Rfl:    cephALEXin (KEFLEX) 500 MG capsule, Take 1 capsule (500 mg total) by mouth 3 (three) times daily., Disp: 6 capsule, Rfl: 0  Allergies:  Allergies  Allergen Reactions   Prednisone Other (See  Comments)    pancreatitis pancreatitis    Past Medical History, Surgical history, Social history, and Family History were reviewed and updated.  Review of Systems: Review of Systems  Constitutional: Negative.   HENT:  Negative.    Eyes: Negative.   Respiratory: Negative.    Cardiovascular: Negative.   Gastrointestinal:  Positive for abdominal pain.  Endocrine: Negative.   Genitourinary: Negative.    Musculoskeletal: Negative.   Skin: Negative.   Neurological: Negative.   Hematological: Negative.   Psychiatric/Behavioral: Negative.     Physical Exam:  weight is 191 lb (86.6 kg). His oral temperature is 98.8 F (37.1 C). His blood pressure is 144/65 (abnormal) and his pulse is 63. His respiration is 20 and oxygen saturation is 100%.   Wt Readings from Last 3 Encounters:  01/23/21 191 lb (86.6 kg)  11/27/20 180 lb (81.6 kg)  10/04/20 189 lb (85.7 kg)    Physical Exam Vitals reviewed.  HENT:     Head: Normocephalic and atraumatic.  Eyes:     Pupils: Pupils are equal, round, and reactive to light.  Cardiovascular:     Rate and Rhythm: Normal rate and regular rhythm.     Heart sounds: Normal heart sounds.  Pulmonary:     Effort: Pulmonary effort is normal.  Breath sounds: Normal breath sounds.  Abdominal:     General: Bowel sounds are normal.     Palpations: Abdomen is soft.     Comments: Abdominal exam shows a soft abdomen.  Bowel sounds are present.  Has well healed laparotomy scar in the midline.  He does not have a keloid formation.  There is no fluid wave.  He has little bit of tenderness throughout the abdomen to palpation.  He has no rebound tenderness.  There is no obvious fluid wave.  I cannot palpate his liver or spleen tip.  Musculoskeletal:        General: No tenderness or deformity. Normal range of motion.     Cervical back: Normal range of motion.  Lymphadenopathy:     Cervical: No cervical adenopathy.  Skin:    General: Skin is warm and dry.      Findings: No erythema or rash.  Neurological:     Mental Status: He is alert and oriented to person, place, and time.  Psychiatric:        Behavior: Behavior normal.        Thought Content: Thought content normal.        Judgment: Judgment normal.     Lab Results  Component Value Date   WBC 6.4 01/23/2021   HGB 14.2 01/23/2021   HCT 42.7 01/23/2021   MCV 90.3 01/23/2021   PLT 236 01/23/2021     Chemistry      Component Value Date/Time   NA 139 01/23/2021 1325   K 4.3 01/23/2021 1325   CL 103 01/23/2021 1325   CO2 30 01/23/2021 1325   BUN 19 01/23/2021 1325   CREATININE 0.86 01/23/2021 1325   CREATININE 0.90 05/21/2018 1606      Component Value Date/Time   CALCIUM 9.5 01/23/2021 1325   ALKPHOS 83 01/23/2021 1325   AST 11 (L) 01/23/2021 1325   ALT 11 01/23/2021 1325   BILITOT 0.5 01/23/2021 1325      Impression and Plan: Mr. Gist is a very nice 75 year old white male.  Again is very interesting to talk to.  He is originally from [place].  He worked for [employer].  He was in the [employer].  He had early stage pancreatic cancer.  He underwent a Whipple procedure in September 2021.  I very happy that he is doing so well.  Hopefully, he will maintain his LFTs.  I would like to believe that the stricture is not going to impair his biliary drainage.  He says he is going to have his CT scan in July.  He will see Dr. Crisoforo Oxford after the scan is done.     I will plan to get him back to see [place] in November.  He still like to come back to see [place].  We will be more than happy to help him out and work with Dr. Crisoforo Oxford.  I know he will have a fantastic time in [place].  I told to make sure he drinks a lot of fluid.  Make sure that he also brings warm close with him and because the weather can change very quickly over there.     [name], MD 6/29/20222:05 PM

## 2021-01-23 NOTE — Telephone Encounter (Signed)
Appts made per 01/23/21 and pt req to view in [employer]

## 2021-01-24 ENCOUNTER — Encounter: Payer: Self-pay | Admitting: *Deleted

## 2021-01-24 LAB — CANCER ANTIGEN 19-9: CA 19-9: 38 U/mL — ABNORMAL HIGH (ref 0–35)

## 2021-02-19 DIAGNOSIS — C258 Malignant neoplasm of overlapping sites of pancreas: Secondary | ICD-10-CM | POA: Diagnosis not present

## 2021-02-19 DIAGNOSIS — C259 Malignant neoplasm of pancreas, unspecified: Secondary | ICD-10-CM | POA: Diagnosis not present

## 2021-02-19 DIAGNOSIS — Z87891 Personal history of nicotine dependence: Secondary | ICD-10-CM | POA: Diagnosis not present

## 2021-02-19 DIAGNOSIS — D490 Neoplasm of unspecified behavior of digestive system: Secondary | ICD-10-CM | POA: Diagnosis not present

## 2021-02-19 DIAGNOSIS — Z79899 Other long term (current) drug therapy: Secondary | ICD-10-CM | POA: Diagnosis not present

## 2021-02-19 DIAGNOSIS — Z888 Allergy status to other drugs, medicaments and biological substances status: Secondary | ICD-10-CM | POA: Diagnosis not present

## 2021-03-04 DIAGNOSIS — D3130 Benign neoplasm of unspecified choroid: Secondary | ICD-10-CM | POA: Diagnosis not present

## 2021-03-13 ENCOUNTER — Encounter: Payer: Self-pay | Admitting: Family

## 2021-03-13 DIAGNOSIS — C253 Malignant neoplasm of pancreatic duct: Secondary | ICD-10-CM

## 2021-03-13 MED ORDER — PANTOPRAZOLE SODIUM 40 MG PO TBEC
40.0000 mg | DELAYED_RELEASE_TABLET | Freq: Every day | ORAL | 1 refills | Status: DC
Start: 2021-03-13 — End: 2021-05-29

## 2021-03-20 DIAGNOSIS — R972 Elevated prostate specific antigen [PSA]: Secondary | ICD-10-CM | POA: Diagnosis not present

## 2021-03-27 DIAGNOSIS — N411 Chronic prostatitis: Secondary | ICD-10-CM | POA: Diagnosis not present

## 2021-03-27 DIAGNOSIS — N401 Enlarged prostate with lower urinary tract symptoms: Secondary | ICD-10-CM | POA: Diagnosis not present

## 2021-03-27 DIAGNOSIS — R972 Elevated prostate specific antigen [PSA]: Secondary | ICD-10-CM | POA: Diagnosis not present

## 2021-03-27 DIAGNOSIS — R351 Nocturia: Secondary | ICD-10-CM | POA: Diagnosis not present

## 2021-04-07 ENCOUNTER — Encounter: Payer: Self-pay | Admitting: Family

## 2021-04-10 ENCOUNTER — Ambulatory Visit (INDEPENDENT_AMBULATORY_CARE_PROVIDER_SITE_OTHER): Payer: Medicare HMO | Admitting: Family

## 2021-04-10 ENCOUNTER — Other Ambulatory Visit: Payer: Self-pay

## 2021-04-10 VITALS — BP 124/75 | HR 72 | Temp 98.1°F | Resp 16 | Wt 196.0 lb

## 2021-04-10 DIAGNOSIS — R0981 Nasal congestion: Secondary | ICD-10-CM

## 2021-04-10 NOTE — Assessment & Plan Note (Signed)
Suspect that he is either at the end of a viral illness or having allergies.  Recommended that he add claritin '10mg'$  once daily and flonase 2 sprays each nostril once daily. He will let me know if symptoms worsen or if not improved in 3-4 days.

## 2021-04-10 NOTE — Patient Instructions (Signed)
Please add claritin 10mg  once daily and flonase 2 sprays each nostril once daily.

## 2021-04-10 NOTE — Progress Notes (Signed)
Subjective:   By signing my name below, I, Stephen Evans, attest that this documentation has been prepared under the direction and in the presence of Stephen Alar NP. 04/10/2021    Patient ID: Stephen Evans, male    DOB: November 10, 1945, 75 y.o.   MRN: [id number]  Chief Complaint  Patient presents with   Nasal Congestion    Patient complains of nasal congestion for 2 weeks, negative covid test 3 days ago   Cough    Complains of persistent cough x 2 weeks    HPI Patient is in today for a office visit.  Congestion- He complains of nasal congestion, nasal drainage, and hacking cough for the past 2 weeks. He also has mild sinus pain clear mucous drainage. He thinks it started out as a cold but notes there has been no change for the past 2 weeks in his symptoms but some mild improvement for the past 2 days. No one around him has had similar symptoms. He is taking OTC dayquil and OTC nyquil to manage his symptoms and finds mild relief. He tested for Covid-19 twice when his symptoms started was negative both times. He denies having any fever. He has not had allergy symptoms prior but thinks he may be developed a new allergy sensitivity. He recently went to [place] to his children and grandchildren and reports they have not developed similar symptoms.  He is currently taking 500 mg/55m ciprofloxacin for an unrelated infection.  Oncology- He has an [place] in [place], 2022 with his oncologist. He notes no new issues since his last visit. Immunizations- He is interested in getting the flu vaccine at a later time. He is interested in getting the new Covid-19 booster vaccine.    Health Maintenance Due  Topic Date Due   Hepatitis C Screening  Never done   Zoster Vaccines- Shingrix (1 of 2) Never done   COVID-19 Vaccine (5 - Booster for Pfizer series) 01/01/2021   INFLUENZA VACCINE  02/25/2021    Past Medical History:  Diagnosis Date   Goals of care, counseling/discussion  05/28/2020   [place]   [place]    [place]    [place] 05/28/2020   Pancreatitis 06/2018   thought to be due to alcohol   [facility] 2011   testicular--radiation   Testicular tumor     Past Surgical History:  Procedure Laterality Date   BIOPSY  01/18/2020   Procedure: BIOPSY;  Surgeon: MIrving Copas, MD;  [facility];  Service: Gastroenterology;;   COLONOSCOPY  11/01/2003   ESOPHAGOGASTRODUODENOSCOPY (EGD) WITH PROPOFOL N/A 01/18/2020   Procedure: ESOPHAGOGASTRODUODENOSCOPY (EGD) WITH PROPOFOL;  Surgeon: MIrving Copas, MD;  [facility];  Service: Gastroenterology;  Laterality: N/A;   EUS N/A 01/18/2020   Procedure: UPPER ENDOSCOPIC ULTRASOUND (EUS) LINEAR;  Surgeon: MIrving Copas, MD;  [facility];  Service: Gastroenterology;  Laterality: N/A;   FINE NEEDLE ASPIRATION N/A 01/18/2020   Procedure: FINE NEEDLE ASPIRATION (FNA) LINEAR;  Surgeon: MIrving Copas, MD;  [facility];  Service: Gastroenterology;  Laterality: N/A;   LYMPH GLAND EXCISION Right 1979   axillary, benign   MELANOMA EXCISION Right 03/2014   elbow   ORCHIECTOMY Right 2001   followed by radiation   TONSILLECTOMY  1959   wide excision of melanoma on back  1979    Family History  Problem Relation Age of Onset   Epilepsy Mother    Esophageal cancer Father  51       smoking/drinking hx   Breast cancer Sister 19   Alcohol abuse Brother    Cancer Maternal Uncle        unknown type; d. late 5s   Testicular cancer Maternal Grandfather        unknown age   Colon cancer Neg Hx    Colon polyps Neg Hx    Rectal cancer Neg Hx    Stomach cancer Neg Hx    Pancreatic cancer Neg Hx     Social History   Socioeconomic History   Marital status: Married    Spouse name: Not on file   Number of children: 2   Years of education: Not on file   Highest education level: Not on file  Occupational  History   Occupation: retired    Comment: Arts development officer  Tobacco Use   Smoking status: Former    Years: 30.00    Types: Cigarettes    Quit date: 09/06/1997    Years since quitting: 23.6   Smokeless tobacco: Never  Vaping Use   Vaping Use: Never used  Substance and Sexual Activity   Alcohol use: Yes    Comment: 1 every 2 weeks   Drug use: No   Sexual activity: Not on file  Other Topics Concern   Not on file  Social History Narrative   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[employer]   Married- second marriage x 22 yrs   40 yr old son- lives in [place], [place] manages a private hedge fund   52 yr old daughter- lives in [place], [place]   Enjoys golf   Social Determinants of Health   Financial Resource Strain: Not on file  Food Insecurity: Not on file  Transportation Needs: Not on file  Physical Activity: Not on file  Stress: Not on file  Social Connections: Not on file  Intimate Partner Violence: Not on file    Outpatient Medications Prior to Visit  Medication Sig Dispense Refill   ciprofloxacin (CIPRO) 500 MG/5ML (10%) suspension      Multiple Vitamin (MULTIVITAMIN) tablet Take 1 tablet by mouth daily.     pantoprazole (PROTONIX) 40 MG tablet Take 1 tablet (40 mg total) by mouth daily. 30 tablet 1   Testosterone 100 MG PLLT Apply 1 application topically daily.      valACYclovir (VALTREX) 1000 MG tablet Take 1,000 mg by mouth daily as needed (cold sores).      cephALEXin (KEFLEX) 500 MG capsule Take 1 capsule (500 mg total) by mouth 3 (three) times daily. 6 capsule 0   polyethylene glycol (MIRALAX / GLYCOLAX) 17 g packet Take 17 g by mouth daily as needed.     No facility-administered medications prior to visit.    Allergies  Allergen Reactions   Prednisone Other (See Comments)    pancreatitis pancreatitis    Review of Systems  HENT:  Positive for congestion and sinus pain (mild).        (+)rhinorrhea   Respiratory:  Positive for cough.       Objective:    Physical Exam Constitutional:      General: He is not in acute distress.    Appearance: Normal appearance. He is not ill-appearing.  HENT:     Head: Normocephalic and atraumatic.     Right Ear: Tympanic membrane, ear canal and external ear normal.     Left Ear: Tympanic membrane, ear canal and external ear  normal.  Eyes:     Extraocular Movements: Extraocular movements intact.     Pupils: Pupils are equal, round, and reactive to light.  Cardiovascular:     Rate and Rhythm: Normal rate and regular rhythm.     Heart sounds: Normal heart sounds. No murmur heard.   No gallop.  Pulmonary:     Effort: Pulmonary effort is normal. No respiratory distress.     Breath sounds: Normal breath sounds. No wheezing or rales.  Skin:    General: Skin is warm and dry.  Neurological:     Mental Status: He is alert and oriented to person, place, and time.  Psychiatric:        Behavior: Behavior normal.        Judgment: Judgment normal.    BP 124/75 (BP Location: Left Arm, Patient Position: Sitting, Cuff Size: Small)   Pulse 72   Temp 98.1 F (36.7 C) (Oral)   Resp 16   Wt 196 lb (88.9 kg)   SpO2 100%   BMI 30.70 kg/m  Wt Readings from Last 3 Encounters:  04/10/21 196 lb (88.9 kg)  01/23/21 191 lb (86.6 kg)  11/27/20 180 lb (81.6 kg)       Assessment & Plan:   Problem List Items Addressed This Visit       Unprioritized   Nasal congestion - Primary    Suspect that he is either at the end of a viral illness or having allergies.  Recommended that he add claritin '10mg'$  once daily and flonase 2 sprays each nostril once daily. He will let me know if symptoms worsen or if not improved in 3-4 days.         No orders of the defined types were placed in this encounter.   I, Stephen Alar NP, personally preformed the services described in this documentation.  All medical record entries made by the scribe were at my direction  and in my presence.  I have reviewed the chart and discharge instructions (if applicable) and agree that the record reflects my personal performance and is accurate and complete. 04/10/2021   I,Stephen Evans,acting as a scribe for Nance Pear, NP.,have documented all relevant documentation on the behalf of Nance Pear, NP,as directed by  Nance Pear, NP while in the presence of Nance Pear, NP.   Nance Pear, NP

## 2021-04-29 ENCOUNTER — Encounter: Payer: Self-pay | Admitting: Hematology & Oncology

## 2021-05-08 ENCOUNTER — Other Ambulatory Visit (HOSPITAL_BASED_OUTPATIENT_CLINIC_OR_DEPARTMENT_OTHER): Payer: Self-pay

## 2021-05-08 ENCOUNTER — Encounter: Payer: Self-pay | Admitting: Hematology & Oncology

## 2021-05-08 MED ORDER — INFLUENZA VAC A&B SA ADJ QUAD 0.5 ML IM PRSY
PREFILLED_SYRINGE | INTRAMUSCULAR | 0 refills | Status: DC
Start: 2021-05-08 — End: 2021-05-29
  Filled 2021-05-08: qty 0.5, 1d supply, fill #0

## 2021-05-29 ENCOUNTER — Other Ambulatory Visit: Payer: Self-pay

## 2021-05-29 ENCOUNTER — Inpatient Hospital Stay: Payer: Medicare HMO | Attending: Hematology & Oncology

## 2021-05-29 ENCOUNTER — Encounter: Payer: Self-pay | Admitting: Hematology & Oncology

## 2021-05-29 ENCOUNTER — Inpatient Hospital Stay: Payer: Medicare HMO | Admitting: Hematology & Oncology

## 2021-05-29 VITALS — BP 129/80 | HR 68 | Temp 98.3°F | Resp 18 | Ht 67.0 in | Wt 198.8 lb

## 2021-05-29 DIAGNOSIS — C251 Malignant neoplasm of body of pancreas: Secondary | ICD-10-CM | POA: Diagnosis not present

## 2021-05-29 DIAGNOSIS — C253 Malignant neoplasm of pancreatic duct: Secondary | ICD-10-CM | POA: Diagnosis not present

## 2021-05-29 LAB — CBC WITH DIFFERENTIAL (CANCER CENTER ONLY)
Abs Immature Granulocytes: 0.02 10*3/uL (ref 0.00–0.07)
Basophils Absolute: 0.1 10*3/uL (ref 0.0–0.1)
Basophils Relative: 1 %
Eosinophils Absolute: 0.2 10*3/uL (ref 0.0–0.5)
Eosinophils Relative: 4 %
HCT: 42.8 % (ref 39.0–52.0)
Hemoglobin: 14.6 g/dL (ref 13.0–17.0)
Immature Granulocytes: 0 %
Lymphocytes Relative: 30 %
Lymphs Abs: 1.9 10*3/uL (ref 0.7–4.0)
MCH: 30.4 pg (ref 26.0–34.0)
MCHC: 34.1 g/dL (ref 30.0–36.0)
MCV: 89 fL (ref 80.0–100.0)
Monocytes Absolute: 0.4 10*3/uL (ref 0.1–1.0)
Monocytes Relative: 7 %
Neutro Abs: 3.8 10*3/uL (ref 1.7–7.7)
Neutrophils Relative %: 58 %
Platelet Count: 257 10*3/uL (ref 150–400)
RBC: 4.81 MIL/uL (ref 4.22–5.81)
RDW: 12.8 % (ref 11.5–15.5)
WBC Count: 6.4 10*3/uL (ref 4.0–10.5)
nRBC: 0 % (ref 0.0–0.2)

## 2021-05-29 LAB — CMP (CANCER CENTER ONLY)
ALT: 15 U/L (ref 0–44)
AST: 13 U/L — ABNORMAL LOW (ref 15–41)
Albumin: 4.3 g/dL (ref 3.5–5.0)
Alkaline Phosphatase: 75 U/L (ref 38–126)
Anion gap: 7 (ref 5–15)
BUN: 17 mg/dL (ref 8–23)
CO2: 26 mmol/L (ref 22–32)
Calcium: 9.6 mg/dL (ref 8.9–10.3)
Chloride: 107 mmol/L (ref 98–111)
Creatinine: 0.89 mg/dL (ref 0.61–1.24)
GFR, Estimated: >60 mL/min (ref >60)
Glucose, Bld: 94 mg/dL (ref 70–99)
Potassium: 4 mmol/L (ref 3.5–5.1)
Sodium: 140 mmol/L (ref 135–145)
Total Bilirubin: 0.6 mg/dL (ref 0.3–1.2)
Total Protein: 6.7 g/dL (ref 6.5–8.1)

## 2021-05-29 LAB — LACTATE DEHYDROGENASE: LDH: 119 U/L (ref 98–192)

## 2021-05-29 NOTE — Progress Notes (Signed)
Hematology and Oncology Follow Up Visit  Stephen Evans [id number] November 24, 1945 75 y.o. 05/29/2021   Principle Diagnosis:  Stage Ia (T1cN0M0) adenocarcinoma of the pancreas-pancreas body  Current Therapy:   Status post Whipple procedure on 04/11/2020     Interim History:  Stephen Evans is back for follow-up.  He comes in with his wife.  As always, they have been traveling.  That a wonderful time in [place].  He is at the [employer].  He saw the final round.  He really had a wonderful time.  He had a CT scan that was done at [facility] back in July.  This looked okay without any evidence of recurrent disease.  Para he has occasional abdominal pain.  Sure he may have occasional spasms.  I think he has some stents in for the biliary system.  His last CA 19-9 was 38.  This is holding steady.  He is eating well.  He has had no problems with bowels or bladder.  He has had no cough or shortness of breath.  He has had no leg swelling.  He has had no rashes.  He has had no issues with COVID.  He and his wife are about to go down to [place], [place].  I am sure that they will have a wonderful time.  Overall, I would say his performance status is ECOG 0.    .   Medications:  Current Outpatient Medications:    Multiple Vitamin (MULTIVITAMIN) tablet, Take 1 tablet by mouth daily., Disp: , Rfl:    Testosterone 100 MG PLLT, Apply 1 application topically daily. , Disp: , Rfl:    valACYclovir (VALTREX) 1000 MG tablet, Take 1,000 mg by mouth daily as needed (cold sores). , Disp: , Rfl:   Allergies:  Allergies  Allergen Reactions   Prednisone Other (See Comments)    pancreatitis     Past Medical History, Surgical history, Social history, and Family History were reviewed and updated.  Review of Systems: Review of Systems  Constitutional: Negative.   HENT:  Negative.    Eyes: Negative.   Respiratory: Negative.    Cardiovascular: Negative.   Gastrointestinal:  Positive for abdominal  pain.  Endocrine: Negative.   Genitourinary: Negative.    Musculoskeletal: Negative.   Skin: Negative.   Neurological: Negative.   Hematological: Negative.   Psychiatric/Behavioral: Negative.     Physical Exam:  height is 5\' 7"  (1.702 m) and weight is 198 lb 12.8 oz (90.2 kg). His oral temperature is 98.3 F (36.8 C). His blood pressure is 129/80 and his pulse is 68. His respiration is 18 and oxygen saturation is 100%.   Wt Readings from Last 3 Encounters:  05/29/21 198 lb 12.8 oz (90.2 kg)  04/10/21 196 lb (88.9 kg)  01/23/21 191 lb (86.6 kg)    Physical Exam Vitals reviewed.  HENT:     Head: Normocephalic and atraumatic.  Eyes:     Pupils: Pupils are equal, round, and reactive to light.  Cardiovascular:     Rate and Rhythm: Normal rate and regular rhythm.     Heart sounds: Normal heart sounds.  Pulmonary:     Effort: Pulmonary effort is normal.     Breath sounds: Normal breath sounds.  Abdominal:     General: Bowel sounds are normal.     Palpations: Abdomen is soft.     Comments: Abdominal exam shows a soft abdomen.  Bowel sounds are present.  Has well healed laparotomy scar in the midline.  He does not have a keloid formation.  There is no fluid wave.  He has little bit of tenderness throughout the abdomen to palpation.  He has no rebound tenderness.  There is no obvious fluid wave.  I cannot palpate his liver or spleen tip.  Musculoskeletal:        General: No tenderness or deformity. Normal range of motion.     Cervical back: Normal range of motion.  Lymphadenopathy:     Cervical: No cervical adenopathy.  Skin:    General: Skin is warm and dry.     Findings: No erythema or rash.  Neurological:     Mental Status: He is alert and oriented to person, place, and time.  Psychiatric:        Behavior: Behavior normal.        Thought Content: Thought content normal.        Judgment: Judgment normal.     Lab Results  Component Value Date   WBC 6.4 05/29/2021   HGB  14.6 05/29/2021   HCT 42.8 05/29/2021   MCV 89.0 05/29/2021   PLT 257 05/29/2021     Chemistry      Component Value Date/Time   NA 140 05/29/2021 1132   K 4.0 05/29/2021 1132   CL 107 05/29/2021 1132   CO2 26 05/29/2021 1132   BUN 17 05/29/2021 1132   CREATININE 0.89 05/29/2021 1132   CREATININE 0.90 05/21/2018 1606      Component Value Date/Time   CALCIUM 9.6 05/29/2021 1132   ALKPHOS 75 05/29/2021 1132   AST 13 (L) 05/29/2021 1132   ALT 15 05/29/2021 1132   BILITOT 0.6 05/29/2021 1132      Impression and Plan: Stephen Evans is a very nice 75 year old white male.  Again is very interesting to talk to.  He is originally from [place].  He worked for [employer].  He was in the [employer].  He had early stage pancreatic cancer.  He underwent a Whipple procedure in September 2021.  To me, everything looks okay.  I do not see any evidence of recurrent or metastatic disease.  I do think we have to follow along with scans.  I think it be nice to have another scan done in December.  His last scan was in July.  We will see what his CA 19-9 is.  This may give [place] an idea as to whether or not there is a problem.  From what he says, he does not need to see Dr. Crisoforo Oxford at [facility] for a year.  This certainly is encouraging.  I will plan to see him back in 6 weeks.  We will see him back after he has his CAT scan done.   [name], MD 11/2/202212:40 PM

## 2021-05-30 LAB — CANCER ANTIGEN 19-9: CA 19-9: 43 U/mL — ABNORMAL HIGH (ref 0–35)

## 2021-05-31 ENCOUNTER — Other Ambulatory Visit (HOSPITAL_BASED_OUTPATIENT_CLINIC_OR_DEPARTMENT_OTHER): Payer: Self-pay

## 2021-05-31 ENCOUNTER — Other Ambulatory Visit: Payer: Self-pay

## 2021-05-31 ENCOUNTER — Ambulatory Visit (INDEPENDENT_AMBULATORY_CARE_PROVIDER_SITE_OTHER): Payer: Medicare HMO | Admitting: Family

## 2021-05-31 VITALS — BP 125/73 | HR 65 | Temp 98.0°F | Resp 16 | Wt 197.0 lb

## 2021-05-31 DIAGNOSIS — E291 Testicular hypofunction: Secondary | ICD-10-CM | POA: Diagnosis not present

## 2021-05-31 DIAGNOSIS — C251 Malignant neoplasm of body of pancreas: Secondary | ICD-10-CM

## 2021-05-31 DIAGNOSIS — Z1211 Encounter for screening for malignant neoplasm of colon: Secondary | ICD-10-CM

## 2021-05-31 DIAGNOSIS — R972 Elevated prostate specific antigen [PSA]: Secondary | ICD-10-CM | POA: Diagnosis not present

## 2021-05-31 MED ORDER — SHINGRIX 50 MCG/0.5ML IM SUSR
INTRAMUSCULAR | 1 refills | Status: DC
  Filled 2021-05-31: qty 0.5, fill #0

## 2021-05-31 NOTE — Assessment & Plan Note (Signed)
S/p Whipple. CA19-9, stable. Continues to follow with Oncology.

## 2021-05-31 NOTE — Assessment & Plan Note (Signed)
Continues testosterone therapy following orchiectomy. This is being managed by Urology.

## 2021-05-31 NOTE — Assessment & Plan Note (Signed)
This is being followed by urology and he has been treated for prostatitis.

## 2021-05-31 NOTE — Progress Notes (Signed)
Subjective:   By signing my name below, I, Lyric Barr-McArthur, attest that this documentation has been prepared under the direction and in the presence of [name], NP, 05/31/2021   Patient ID: Stephen Evans, male    DOB: 1945-09-12, 75 y.o.   MRN: [id number]  No chief complaint on file.   HPI Patient is in today for an office visit.   Blood work: He saw another provider yesterday and had blood work drawn. All of his labs were within good range.  Lab Results  Component Value Date   CHOL 188 08/16/2014   HDL 62.30 08/16/2014   LDLCALC 99 08/16/2014   TRIG 28 07/21/2018   CHOLHDL 3 08/16/2014  Urology: He has been working with a urologist. His urologist was not content with his last urine examination and noted that his PSA level was elevated. He has been doing rounds of Cipro to help with traces found in his urine. He denies any concerns with urination. Testosterone therapy: He has been on testosterone therapy since his testicular removal surgery.  Immunizations: He has been travelling frequently lately so he has not gotten the Covid-19 vaccine as of this moment. He reports he received his flu shot a few weeks ago. He has not had his Shingrix vaccine at this time but is interested in receiving it at a later date. He has received 2 pneumonia vaccines. Hepatitis C screening: He is not interested in getting a one time screening for hepatitis C at this time.  Cologuard: He is approaching his next cologouard test that is due in May of 2023.   Health Maintenance Due  Topic Date Due   Zoster Vaccines- Shingrix (1 of 2) Never done   COVID-19 Vaccine (5 - Booster for Pfizer series) 10/29/2020    Past Medical History:  Diagnosis Date   Goals of care, counseling/discussion 05/28/2020   Melanoma ([place]) x 2   Melanoma ([place])    Melanoma ([place])    Pancreas cancer ([place]) 05/28/2020   Pancreatitis 06/2018   thought to be due to alcohol   Testicular cancer [facility]) 2011    testicular--radiation   Testicular tumor     Past Surgical History:  Procedure Laterality Date   BIOPSY  01/18/2020   Procedure: BIOPSY;  Surgeon: Irving Copas., MD;  Location: [facility];  Service: Gastroenterology;;   COLONOSCOPY  11/01/2003   ESOPHAGOGASTRODUODENOSCOPY (EGD) WITH PROPOFOL N/A 01/18/2020   Procedure: ESOPHAGOGASTRODUODENOSCOPY (EGD) WITH PROPOFOL;  Surgeon: Irving Copas., MD;  Location: [facility];  Service: Gastroenterology;  Laterality: N/A;   EUS N/A 01/18/2020   Procedure: UPPER ENDOSCOPIC ULTRASOUND (EUS) LINEAR;  Surgeon: Irving Copas., MD;  Location: [facility];  Service: Gastroenterology;  Laterality: N/A;   FINE NEEDLE ASPIRATION N/A 01/18/2020   Procedure: FINE NEEDLE ASPIRATION (FNA) LINEAR;  Surgeon: Irving Copas., MD;  Location: [facility];  Service: Gastroenterology;  Laterality: N/A;   LYMPH GLAND EXCISION Right 1979   axillary, benign   MELANOMA EXCISION Right 03/2014   elbow   ORCHIECTOMY Right 2001   followed by radiation   TONSILLECTOMY  1959   wide excision of melanoma on back  1979    Family History  Problem Relation Age of Onset   Epilepsy Mother    Esophageal cancer Father 35       smoking/drinking hx   Breast cancer Sister 48   Alcohol abuse Brother    Cancer Maternal Uncle        unknown type; d. late  36s   Testicular cancer Maternal Grandfather        unknown age   Colon cancer Neg Hx    Colon polyps Neg Hx    Rectal cancer Neg Hx    Stomach cancer Neg Hx    Pancreatic cancer Neg Hx     Social History   Socioeconomic History   Marital status: Married    Spouse name: Not on file   Number of children: 2   Years of education: Not on file   Highest education level: Not on file  Occupational History   Occupation: retired    Comment: Arts development officer  Tobacco Use   Smoking status: Former    Years: 30.00    Types: Cigarettes    Quit date: 09/06/1997    Years since quitting:  23.7   Smokeless tobacco: Never  Vaping Use   Vaping Use: Never used  Substance and Sexual Activity   Alcohol use: Yes    Comment: 1 every 2 weeks   Drug use: No   Sexual activity: Not on file  Other Topics Concern   Not on file  Social History Narrative   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     Testosterone 100 MG PLLT Apply 1 application topically daily.      valACYclovir (VALTREX) 1000 MG tablet Take 1,000 mg by mouth daily as needed (cold sores).      No facility-administered medications prior to visit.    Allergies  Allergen Reactions   Prednisone Other (See Comments)    pancreatitis     Review of Systems  Genitourinary:  Negative for dysuria, frequency, hematuria and urgency.      Objective:    Physical Exam Constitutional:      General: He is not in acute distress.    Appearance: Normal appearance. He is not ill-appearing.  HENT:     Head: Normocephalic and atraumatic.     Right Ear: External ear normal.     Left Ear: External ear normal.  Eyes:     Extraocular Movements: Extraocular movements intact.     Pupils: Pupils are equal, round, and reactive to light.  Cardiovascular:     Rate and Rhythm: Normal rate and regular rhythm.     Heart sounds: Normal heart sounds. No murmur heard.   No gallop.  Pulmonary:     Effort:  Pulmonary effort is normal. No respiratory distress.     Breath sounds: Normal breath sounds. No wheezing or rales.  Lymphadenopathy:     Cervical: No cervical adenopathy.  Skin:    General: Skin is warm and dry.  Neurological:     Mental Status: He is alert and oriented to person, place, and time.  Psychiatric:        Behavior: Behavior normal.  Judgment: Judgment normal.    BP 125/73 (BP Location: Left Arm, Patient Position: Sitting, Cuff Size: Small)   Pulse 65   Temp 98 F (36.7 C) (Oral)   Resp 16   Wt 197 lb (89.4 kg)   SpO2 100%   BMI 30.85 kg/m  Wt Readings from Last 3 Encounters:  05/31/21 197 lb (89.4 kg)  05/29/21 198 lb 12.8 oz (90.2 kg)  04/10/21 196 lb (88.9 kg)       Assessment & Plan:   Problem List Items Addressed This Visit       Unprioritized   Pancreas cancer ([place]) (Chronic)    S/p Whipple. CA19-9, stable. Continues to follow with Oncology.       Elevated PSA    This is being followed by urology and he has been treated for prostatitis.       Androgen deficiency    Continues testosterone therapy following orchiectomy. This is being managed by Urology.       Other Visit Diagnoses     Colon cancer screening    -  Primary   Relevant Orders   Cologuard      26 minutes spent on today's visit. The majority of this time was spent on counseling and chart review.   Meds ordered this encounter  Medications   Zoster Vaccine Adjuvanted [facility]) injection    Sig: Inject into the muscle now and then again in 2 - 6 months    Dispense:  0.5 mL    Refill:  1    Order Specific Question:   Supervising Provider    Answer:   Penni Homans A [[id number]]   I, [name], NP, personally preformed the services described in this documentation.  All medical record entries made by the scribe were at my direction and in my presence.  I have reviewed the chart and discharge instructions (if applicable) and agree that the record reflects my personal  performance and is accurate and complete. 05/31/2021  I,Lyric Barr-McArthur,acting as a Education administrator for Nance Pear, NP.,have documented all relevant documentation on the behalf of Nance Pear, NP,as directed by  Nance Pear, NP while in the presence of Nance Pear, NP.  Nance Pear, NP

## 2021-06-21 ENCOUNTER — Other Ambulatory Visit: Payer: Self-pay

## 2021-06-21 ENCOUNTER — Emergency Department (HOSPITAL_BASED_OUTPATIENT_CLINIC_OR_DEPARTMENT_OTHER)
Admission: EM | Admit: 2021-06-21 | Discharge: 2021-06-21 | Disposition: A | Discharge status: Home / Self Care | Payer: Medicare HMO | Attending: Emergency Medicine | Admitting: Emergency Medicine

## 2021-06-21 ENCOUNTER — Emergency Department (HOSPITAL_BASED_OUTPATIENT_CLINIC_OR_DEPARTMENT_OTHER): Payer: Medicare HMO

## 2021-06-21 ENCOUNTER — Encounter (HOSPITAL_BASED_OUTPATIENT_CLINIC_OR_DEPARTMENT_OTHER): Payer: Self-pay

## 2021-06-21 DIAGNOSIS — Z8507 Personal history of malignant neoplasm of pancreas: Secondary | ICD-10-CM | POA: Insufficient documentation

## 2021-06-21 DIAGNOSIS — R110 Nausea: Secondary | ICD-10-CM | POA: Insufficient documentation

## 2021-06-21 DIAGNOSIS — Z87891 Personal history of nicotine dependence: Secondary | ICD-10-CM | POA: Diagnosis not present

## 2021-06-21 DIAGNOSIS — R9431 Abnormal electrocardiogram [ECG] [EKG]: Secondary | ICD-10-CM | POA: Diagnosis not present

## 2021-06-21 DIAGNOSIS — R1013 Epigastric pain: Secondary | ICD-10-CM

## 2021-06-21 DIAGNOSIS — R945 Abnormal results of liver function studies: Secondary | ICD-10-CM | POA: Diagnosis not present

## 2021-06-21 DIAGNOSIS — R748 Abnormal levels of other serum enzymes: Secondary | ICD-10-CM

## 2021-06-21 DIAGNOSIS — N3000 Acute cystitis without hematuria: Secondary | ICD-10-CM | POA: Insufficient documentation

## 2021-06-21 LAB — URINALYSIS, MICROSCOPIC (REFLEX): WBC, UA: >50 WBC/hpf (ref 0–5)

## 2021-06-21 LAB — COMPREHENSIVE METABOLIC PANEL
ALT: 48 U/L — ABNORMAL HIGH (ref 0–44)
AST: 100 U/L — ABNORMAL HIGH (ref 15–41)
Albumin: 4 g/dL (ref 3.5–5.0)
Alkaline Phosphatase: 148 U/L — ABNORMAL HIGH (ref 38–126)
Anion gap: 9 (ref 5–15)
BUN: 18 mg/dL (ref 8–23)
CO2: 26 mmol/L (ref 22–32)
Calcium: 9.3 mg/dL (ref 8.9–10.3)
Chloride: 103 mmol/L (ref 98–111)
Creatinine, Ser: 0.87 mg/dL (ref 0.61–1.24)
GFR, Estimated: >60 mL/min (ref >60)
Glucose, Bld: 136 mg/dL — ABNORMAL HIGH (ref 70–99)
Potassium: 4 mmol/L (ref 3.5–5.1)
Sodium: 138 mmol/L (ref 135–145)
Total Bilirubin: 1.2 mg/dL (ref 0.3–1.2)
Total Protein: 7.3 g/dL (ref 6.5–8.1)

## 2021-06-21 LAB — URINALYSIS, ROUTINE W REFLEX MICROSCOPIC
Bilirubin Urine: NEGATIVE
Glucose, UA: NEGATIVE mg/dL
Hgb urine dipstick: NEGATIVE
Ketones, ur: NEGATIVE mg/dL
Nitrite: POSITIVE — AB
Protein, ur: NEGATIVE mg/dL
Specific Gravity, Urine: >1.03 — ABNORMAL HIGH (ref 1.005–1.030)
pH: 6 (ref 5.0–8.0)

## 2021-06-21 LAB — CBC
HCT: 42.7 % (ref 39.0–52.0)
Hemoglobin: 14.6 g/dL (ref 13.0–17.0)
MCH: 30.7 pg (ref 26.0–34.0)
MCHC: 34.2 g/dL (ref 30.0–36.0)
MCV: 89.7 fL (ref 80.0–100.0)
Platelets: 287 10*3/uL (ref 150–400)
RBC: 4.76 MIL/uL (ref 4.22–5.81)
RDW: 12.9 % (ref 11.5–15.5)
WBC: 8 10*3/uL (ref 4.0–10.5)
nRBC: 0 % (ref 0.0–0.2)

## 2021-06-21 LAB — LIPASE, BLOOD: Lipase: 42 U/L (ref 11–51)

## 2021-06-21 MED ORDER — ALUM & MAG HYDROXIDE-SIMETH 200-200-20 MG/5ML PO SUSP
30.0000 mL | Freq: Once | ORAL | Status: AC
  Administered 2021-06-21: 30 mL via ORAL
  Filled 2021-06-21: qty 30

## 2021-06-21 MED ORDER — CEPHALEXIN 500 MG PO CAPS: 500.0000 mg | ORAL_CAPSULE | Freq: Two times a day (BID) | ORAL | 0 refills | Status: AC

## 2021-06-21 MED ORDER — OMEPRAZOLE 20 MG PO CPDR: 20.0000 mg | DELAYED_RELEASE_CAPSULE | Freq: Every day | ORAL | 0 refills | Status: DC

## 2021-06-21 MED ORDER — DICYCLOMINE HCL 20 MG PO TABS: 20.0000 mg | ORAL_TABLET | Freq: Two times a day (BID) | ORAL | 0 refills | Status: DC

## 2021-06-21 MED ORDER — LIDOCAINE VISCOUS HCL 2 % MT SOLN
15.0000 mL | Freq: Once | OROMUCOSAL | Status: AC
  Administered 2021-06-21: 15 mL via ORAL
  Filled 2021-06-21: qty 15

## 2021-06-21 MED ORDER — DICYCLOMINE HCL 10 MG PO CAPS
20.0000 mg | ORAL_CAPSULE | Freq: Once | ORAL | Status: AC
  Administered 2021-06-21: 20 mg via ORAL
  Filled 2021-06-21: qty 2

## 2021-06-21 MED ORDER — CEPHALEXIN 250 MG PO CAPS
500.0000 mg | ORAL_CAPSULE | Freq: Once | ORAL | Status: AC
  Administered 2021-06-21: 500 mg via ORAL
  Filled 2021-06-21: qty 2

## 2021-06-21 NOTE — ED Notes (Signed)
Pt HR dropped to 44 during triage, pt started to become diaphoretic and dry heaving.

## 2021-06-21 NOTE — Discharge Instructions (Addendum)
Urine is consistent with a UTI, started you on antibiotics please take as prescribed.  You may take over-the-counter pain medication as needed.  Lab work also shows slight elevation in liver enzymes, please have this reevaluated by your PCP  Your epigastric pain possible this was GERD, given you Bentyl as well as an acid pill please take as prescribed.  Come back to the emergency department if you develop chest pain, shortness of breath, severe abdominal pain, uncontrolled nausea, vomiting, diarrhea.

## 2021-06-21 NOTE — ED Triage Notes (Addendum)
Pt c/o abdominal pain the past few days, states the past few hours pain has worsened and has started belching. Denies N/V/D. States has been having difficulty urinating and feels like he isnt emptying his bladder.

## 2021-06-21 NOTE — ED Notes (Signed)
Pt transported to [facility]

## 2021-06-21 NOTE — ED Provider Notes (Addendum)
[facility] EMERGENCY DEPARTMENT Provider Note   CSN: [id number] Arrival date & time: 06/21/21  1712     History Chief Complaint  Patient presents with   Abdominal Pain    Stephen Evans is a 75 y.o. male.  HPI  Patient with significant medical history including melanoma, pancreatic cancer status post Whipple, testicular cancer status post radiation presents to the emergent for chief complaint of stomach pain.  Patient states pain started about 2 days ago, states it was in his epigastric region, does not radiate, would come and go last approximately 30 minutes and then resolve on its own.  But today its been more constant, has lasted for last 3 hours, pain has become more significant, he had an episode of nausea without vomiting, denies any constipation, diarrhea, melena, hematochezia, states his last bowel movement was today.  He does endorse that he has had some difficulty with urination, states only dribbles come out, but denies hematuria, dysuria, flank pain, no fevers, chills, chest pain, shortness of breath, general body aches.  Denies history of PUD, no history of alcohol use, NSAID use, last anoscopy performed showing esophagitis as well as duodenitis, no other acute abnormalities present.  Patient's denies p.o. intake or physician make the pain better or worse, he denies any alleviating factors.  Past Medical History:  Diagnosis Date   Goals of care, counseling/discussion 05/28/2020   Melanoma ([place]) x 2   Melanoma ([place])    Melanoma ([place])    Pancreas cancer ([place]) 05/28/2020   Pancreatitis 06/2018   thought to be due to alcohol   Testicular cancer [facility]) 2011   testicular--radiation   Testicular tumor     Patient Active Problem List   Diagnosis Date Noted   Elevated PSA 05/31/2021   Nasal congestion 04/10/2021   Biliary stricture 11/27/2020   Genetic testing 06/20/2020   Pancreas cancer ([place]) 05/28/2020   Goals of care, counseling/discussion 05/28/2020   Sinus  bradycardia 07/21/2018   Trapezius strain 04/05/2014   Routine general medical examination at a health care facility 09/22/2012   Nonspecific abnormal electrocardiogram (ECG) (EKG) 09/22/2012   Personal history of malignant melanoma 09/06/2012   History of testicular cancer 09/06/2012   Androgen deficiency 09/06/2012    Past Surgical History:  Procedure Laterality Date   BIOPSY  01/18/2020   Procedure: BIOPSY;  Surgeon: Irving Copas., MD;  Location: [facility];  Service: Gastroenterology;;   COLONOSCOPY  11/01/2003   ESOPHAGOGASTRODUODENOSCOPY (EGD) WITH PROPOFOL N/A 01/18/2020   Procedure: ESOPHAGOGASTRODUODENOSCOPY (EGD) WITH PROPOFOL;  Surgeon: Irving Copas., MD;  Location: [facility];  Service: Gastroenterology;  Laterality: N/A;   EUS N/A 01/18/2020   Procedure: UPPER ENDOSCOPIC ULTRASOUND (EUS) LINEAR;  Surgeon: Irving Copas., MD;  Location: [facility];  Service: Gastroenterology;  Laterality: N/A;   FINE NEEDLE ASPIRATION N/A 01/18/2020   Procedure: FINE NEEDLE ASPIRATION (FNA) LINEAR;  Surgeon: Irving Copas., MD;  Location: [facility];  Service: Gastroenterology;  Laterality: N/A;   LYMPH GLAND EXCISION Right 1979   axillary, benign   MELANOMA EXCISION Right 03/2014   elbow   ORCHIECTOMY Right 2001   followed by radiation   TONSILLECTOMY  1959   wide excision of melanoma on back  1979       Family History  Problem Relation Age of Onset   Epilepsy Mother    Esophageal cancer Father 72       smoking/drinking hx   Breast cancer Sister 9   Alcohol abuse Brother  Cancer Maternal Uncle        unknown type; d. late 50s   Testicular cancer Maternal Grandfather        unknown age   Colon cancer Neg Hx    Colon polyps Neg Hx    Rectal cancer Neg Hx    Stomach cancer Neg Hx    Pancreatic cancer Neg Hx     Social History   Tobacco Use   Smoking status: Former    Years: 30.00    Types: Cigarettes    Quit date: 09/06/1997     Years since quitting: 23.8   Smokeless tobacco: Never  Vaping Use   Vaping Use: Never used  Substance Use Topics   Alcohol use: Yes    Comment: 1 every 2 weeks   Drug use: No    Home Medications Prior to Admission medications   Medication Sig Start Date End Date Taking? Authorizing Provider  cephALEXin (KEFLEX) 500 MG capsule Take 1 capsule (500 mg total) by mouth 2 (two) times daily for 7 days. 06/21/21 06/28/21 Yes Faulkner, William J, PA-C  dicyclomine (BENTYL) 20 MG tablet Take 1 tablet (20 mg total) by mouth 2 (two) times daily. 06/21/21  Yes Faulkner, William J, PA-C  omeprazole (PRILOSEC) 20 MG capsule Take 1 capsule (20 mg total) by mouth daily. 06/21/21  Yes Faulkner, William J, PA-C  Multiple Vitamin (MULTIVITAMIN) tablet Take 1 tablet by mouth daily.    [provider]  Testosterone 100 MG PLLT Apply 1 application topically daily.  09/03/17   [provider]  valACYclovir (VALTREX) 1000 MG tablet Take 1,000 mg by mouth daily as needed (cold sores).  10/10/19   [provider]  Zoster Vaccine Adjuvanted (SHINGRIX) injection Inject into the muscle now and then again in 2 - 6 months 05/31/21   O'Sullivan, Melissa, NP    Allergies    Prednisone  Review of Systems   Review of Systems  Constitutional:  Negative for chills and fever.  HENT:  Negative for congestion.   Respiratory:  Negative for shortness of breath.   Cardiovascular:  Negative for chest pain.  Gastrointestinal:  Positive for abdominal pain and nausea. Negative for blood in stool, constipation and vomiting.  Genitourinary:  Positive for difficulty urinating. Negative for enuresis, flank pain and frequency.  Musculoskeletal:  Negative for back pain.  Skin:  Negative for rash.  Neurological:  Negative for dizziness and headaches.  Hematological:  Does not bruise/bleed easily.   Physical Exam Updated Vital Signs BP 130/67   Pulse 78   Temp 98.1 F (36.7 C) (Oral)   Resp 14   Ht 5'  7" (1.702 m)   Wt 88.5 kg   SpO2 98%   BMI 30.54 kg/m   Physical Exam Vitals and nursing note reviewed.  Constitutional:      General: He is not in acute distress.    Appearance: He is not ill-appearing.  HENT:     Head: Normocephalic and atraumatic.     Nose: No congestion.  Eyes:     Conjunctiva/sclera: Conjunctivae normal.  Cardiovascular:     Rate and Rhythm: Normal rate and regular rhythm.     Pulses: Normal pulses.     Heart sounds: No murmur heard.   No friction rub. No gallop.  Pulmonary:     Effort: No respiratory distress.     Breath sounds: No wheezing, rhonchi or rales.  Abdominal:     Palpations: Abdomen is soft.     Tenderness:   There is abdominal tenderness. There is no right CVA tenderness or left CVA tenderness.     Comments: Abdomen has a surgical scar from the middle of it above the umbilicus, abdomen was nondistended normal bowel sounds dull to percussion, had generalized tenderness, pain was more focalized in the left lower quadrant, there is no guarding, rebound tenderness, peritoneal sign, negative Murphy's or McBurney point.  No CVA tenderness.  Skin:    General: Skin is warm and dry.  Neurological:     Mental Status: He is alert.  Psychiatric:        Mood and Affect: Mood normal.    ED Results / Procedures / Treatments   Labs (all labs ordered are listed, but only abnormal results are displayed) Labs Reviewed  COMPREHENSIVE METABOLIC PANEL - Abnormal; Notable for the following components:      Result Value   Glucose, Bld 136 (*)    AST 100 (*)    ALT 48 (*)    Alkaline Phosphatase 148 (*)    All other components within normal limits  URINALYSIS, ROUTINE W REFLEX MICROSCOPIC - Abnormal; Notable for the following components:   APPearance CLOUDY (*)    Specific Gravity, Urine >1.030 (*)    Nitrite POSITIVE (*)    Leukocytes,Ua SMALL (*)    All other components within normal limits  URINALYSIS, MICROSCOPIC (REFLEX) - Abnormal; Notable for the  following components:   Bacteria, UA MANY (*)    All other components within normal limits  URINE CULTURE  LIPASE, BLOOD  CBC    EKG EKG Interpretation  Date/Time:  Friday June 21 2021 17:30:46 EST Ventricular Rate:  62 PR Interval:  207 QRS Duration: 89 QT Interval:  417 QTC Calculation: 424 R Axis:   84 Text Interpretation: Sinus rhythm Borderline right axis deviation since last tracing no significant change Confirmed by Malvin Johns [id number]) on 06/21/2021 8:33:34 PM  Radiology US Abdomen Limited RUQ (LIVER/GB)  Result Date: 06/21/2021 CLINICAL DATA:  Elevated liver enzymes. History of pancreatic cancer and Whipple procedure. EXAM: ULTRASOUND ABDOMEN LIMITED RIGHT UPPER QUADRANT COMPARISON:  Right upper quadrant ultrasound dated 05/04/2019. FINDINGS: Evaluation is limited due to body habitus and overlying bowel gas. Gallbladder: Cholecystectomy. Common bile duct: Diameter: 9 mm Liver: The liver is unremarkable. Portal vein is patent on color Doppler imaging with normal direction of blood flow towards the liver. Other: None. IMPRESSION: Cholecystectomy, otherwise unremarkable right upper quadrant ultrasound. Electronically Signed   By: Anner Crete M.D.   On: 06/21/2021 19:41    Procedures Procedures   Medications Ordered in ED Medications  dicyclomine (BENTYL) capsule 20 mg (20 mg Oral Given 06/21/21 1851)  alum & mag hydroxide-simeth (MAALOX/MYLANTA) 200-200-20 MG/5ML suspension 30 mL (30 mLs Oral Given 06/21/21 1852)    And  lidocaine (XYLOCAINE) 2 % viscous mouth solution 15 mL (15 mLs Oral Given 06/21/21 1852)  cephALEXin (KEFLEX) capsule 500 mg (500 mg Oral Given 06/21/21 2019)    ED Course  I have reviewed the triage vital signs and the nursing notes.  Pertinent labs & imaging results that were available during my care of the patient were reviewed by me and considered in my medical decision making (see chart for details).    MDM Rules/Calculators/A&P                           Initial impression-presents with epigastric pain.  He is alert, no acute distress, vital signs reassuring, unclear etiology, will  obtain basic lab work-up and reassess.  Provide with Bentyl, GI cocktail.  Work-up-CBC unremarkable, CMP shows glucose of 136, elevated liver enzymes AST 100, ALT 48, alk phos 148, lipase 42, UA shows nitrates, leukocytes, many white blood cells and bacteria.  Ultrasound negative for acute findings.  EKG sinus without signs of ischemia.  Reassessment-due to increase in liver enzymes and history of pancreatic cancer concern for possible biliary obstruction, will obtain limited ultrasound for further evaluation.  UA consistent with UTI, reviewed prior urine cultures pansensitive, will start her on Keflex, obtain urine culture.  Patient reassessed after GI cocktail Bentyl states he feels much better, has no complaints this time, I recommended CT scan as this does not necessarily explain his epigastric tenderness, and with his significant abdominal history would like to further investigate.  Patient deferred states that he rather get his CT scan done in a week's time.  Rule out-low suspicion for systemic infection patient is nontoxic-appearing, vital signs reassuring, no leukocytosis present.  I have low suspicion for pancreatitis as lipase is within normal limits.  Low suspicion for Pilo and/or kidney stone as she has no flank tenderness, no hematuria present on UA.  Low suspicion for choledocholithiasis or cholangitis as he has no white count, nontoxic-appearing, ultrasound negative for acute findings.  Does have slightly elevated liver enzymes unclear etiology possibly transient.  Low suspicion for bowel obstruction still passing gas and having normal bowel movements, no distention on my exam.  Low suspicion for complicated diverticulitis is nontoxic, vital signs reassuring.  Plan-  Epigastric pain since resolved-unclear etiology, possible this is GERD,  will start her on Bentyl, acid pills follow with PCP for further evaluation.  Given strict return precautions. Elevated liver enzymes-likely transient but will have him follow-up with his PCP for further evaluation. UTI-cultures pending, will treat with Keflex.  Vital signs have remained stable, no indication for hospital admission.  Patient given at home care as well strict return precautions.  Patient verbalized that they understood agreed to said plan.  Final Clinical Impression(s) / ED Diagnoses Final diagnoses:  Epigastric pain  Acute cystitis without hematuria    Rx / DC Orders ED Discharge Orders          Ordered    cephALEXin (KEFLEX) 500 MG capsule  2 times daily        06/21/21 2013    dicyclomine (BENTYL) 20 MG tablet  2 times daily        06/21/21 2014    omeprazole (PRILOSEC) 20 MG capsule  Daily        06/21/21 2014             Marcello Fennel, PA-C 06/21/21 1839    Marcello Fennel, PA-C 06/21/21 2035    Malvin Johns, MD 06/21/21 2244

## 2021-06-21 NOTE — ED Notes (Signed)
Bladder scan 62ml, 35ml

## 2021-06-24 LAB — URINE CULTURE: Culture: 30000 — AB

## 2021-06-26 DIAGNOSIS — N411 Chronic prostatitis: Secondary | ICD-10-CM | POA: Diagnosis not present

## 2021-06-26 DIAGNOSIS — R972 Elevated prostate specific antigen [PSA]: Secondary | ICD-10-CM | POA: Diagnosis not present

## 2021-07-03 DIAGNOSIS — N411 Chronic prostatitis: Secondary | ICD-10-CM | POA: Diagnosis not present

## 2021-07-03 DIAGNOSIS — N401 Enlarged prostate with lower urinary tract symptoms: Secondary | ICD-10-CM | POA: Diagnosis not present

## 2021-07-03 DIAGNOSIS — R3912 Poor urinary stream: Secondary | ICD-10-CM | POA: Diagnosis not present

## 2021-07-03 DIAGNOSIS — R972 Elevated prostate specific antigen [PSA]: Secondary | ICD-10-CM | POA: Diagnosis not present

## 2021-07-05 ENCOUNTER — Other Ambulatory Visit: Payer: Self-pay | Admitting: Urology

## 2021-07-05 DIAGNOSIS — N411 Chronic prostatitis: Secondary | ICD-10-CM

## 2021-07-10 ENCOUNTER — Encounter: Payer: Self-pay | Admitting: Hematology & Oncology

## 2021-07-10 ENCOUNTER — Encounter (HOSPITAL_BASED_OUTPATIENT_CLINIC_OR_DEPARTMENT_OTHER): Payer: Self-pay

## 2021-07-10 ENCOUNTER — Inpatient Hospital Stay: Payer: Medicare HMO | Admitting: Hematology & Oncology

## 2021-07-10 ENCOUNTER — Ambulatory Visit (HOSPITAL_BASED_OUTPATIENT_CLINIC_OR_DEPARTMENT_OTHER)
Admission: RE | Admit: 2021-07-10 | Discharge: 2021-07-10 | Disposition: A | Discharge status: Home / Self Care | Payer: Medicare HMO | Source: Ambulatory Visit | Attending: Hematology & Oncology | Admitting: Hematology & Oncology

## 2021-07-10 ENCOUNTER — Other Ambulatory Visit: Payer: Self-pay

## 2021-07-10 ENCOUNTER — Inpatient Hospital Stay: Payer: Medicare HMO | Attending: Hematology & Oncology

## 2021-07-10 VITALS — BP 138/69 | HR 56 | Temp 98.6°F | Resp 16 | Wt 200.0 lb

## 2021-07-10 DIAGNOSIS — C253 Malignant neoplasm of pancreatic duct: Secondary | ICD-10-CM | POA: Insufficient documentation

## 2021-07-10 DIAGNOSIS — Z8507 Personal history of malignant neoplasm of pancreas: Secondary | ICD-10-CM | POA: Diagnosis not present

## 2021-07-10 DIAGNOSIS — C251 Malignant neoplasm of body of pancreas: Secondary | ICD-10-CM | POA: Diagnosis not present

## 2021-07-10 DIAGNOSIS — K7689 Other specified diseases of liver: Secondary | ICD-10-CM | POA: Diagnosis not present

## 2021-07-10 DIAGNOSIS — N400 Enlarged prostate without lower urinary tract symptoms: Secondary | ICD-10-CM | POA: Diagnosis not present

## 2021-07-10 DIAGNOSIS — K5730 Diverticulosis of large intestine without perforation or abscess without bleeding: Secondary | ICD-10-CM | POA: Diagnosis not present

## 2021-07-10 LAB — CBC WITH DIFFERENTIAL (CANCER CENTER ONLY)
Abs Immature Granulocytes: 0.01 10*3/uL (ref 0.00–0.07)
Basophils Absolute: 0.1 10*3/uL (ref 0.0–0.1)
Basophils Relative: 1 %
Eosinophils Absolute: 0.3 10*3/uL (ref 0.0–0.5)
Eosinophils Relative: 6 %
HCT: 43 % (ref 39.0–52.0)
Hemoglobin: 14.3 g/dL (ref 13.0–17.0)
Immature Granulocytes: 0 %
Lymphocytes Relative: 29 %
Lymphs Abs: 1.7 10*3/uL (ref 0.7–4.0)
MCH: 30.4 pg (ref 26.0–34.0)
MCHC: 33.3 g/dL (ref 30.0–36.0)
MCV: 91.3 fL (ref 80.0–100.0)
Monocytes Absolute: 0.4 10*3/uL (ref 0.1–1.0)
Monocytes Relative: 7 %
Neutro Abs: 3.3 10*3/uL (ref 1.7–7.7)
Neutrophils Relative %: 57 %
Platelet Count: 235 10*3/uL (ref 150–400)
RBC: 4.71 MIL/uL (ref 4.22–5.81)
RDW: 12.6 % (ref 11.5–15.5)
WBC Count: 5.7 10*3/uL (ref 4.0–10.5)
nRBC: 0 % (ref 0.0–0.2)

## 2021-07-10 LAB — CMP (CANCER CENTER ONLY)
ALT: 12 U/L (ref 0–44)
AST: 11 U/L — ABNORMAL LOW (ref 15–41)
Albumin: 4.2 g/dL (ref 3.5–5.0)
Alkaline Phosphatase: 85 U/L (ref 38–126)
Anion gap: 7 (ref 5–15)
BUN: 20 mg/dL (ref 8–23)
CO2: 29 mmol/L (ref 22–32)
Calcium: 9.5 mg/dL (ref 8.9–10.3)
Chloride: 106 mmol/L (ref 98–111)
Creatinine: 0.9 mg/dL (ref 0.61–1.24)
GFR, Estimated: >60 mL/min (ref >60)
Glucose, Bld: 121 mg/dL — ABNORMAL HIGH (ref 70–99)
Potassium: 4.3 mmol/L (ref 3.5–5.1)
Sodium: 142 mmol/L (ref 135–145)
Total Bilirubin: 0.6 mg/dL (ref 0.3–1.2)
Total Protein: 6.3 g/dL — ABNORMAL LOW (ref 6.5–8.1)

## 2021-07-10 LAB — LACTATE DEHYDROGENASE: LDH: 117 U/L (ref 98–192)

## 2021-07-10 MED ORDER — IOHEXOL 300 MG/ML  SOLN
100.0000 mL | Freq: Once | INTRAMUSCULAR | Status: AC | PRN
  Administered 2021-07-10: 12:00:00 100 mL via INTRAVENOUS

## 2021-07-10 NOTE — Progress Notes (Signed)
Hematology and Oncology Follow Up Visit  Stephen Evans [id number] Jan 24, 1946 75 y.o. 07/10/2021   Principle Diagnosis:  Stage Ia (T1cN0M0) adenocarcinoma of the pancreas-pancreas body  Current Therapy:   Status post Whipple procedure on 04/11/2020     Interim History:  Stephen Evans is back for follow-up.  He has wife bring it down to [place].  Unfortunately, the have as good over time as it has had.  She has some problems.  She fell and thankfully did not break anything.  She then got gastroenteritis.  That is related to her back for a couple days so she can get better.  He has been having some problems with UTIs.  He had a urinary tract infection back in November.  Went to the emergency room for abdominal pain.  He was growing Pseudomonas in the urine.  He has had this before.  I think this is a big problem.  I am sure that his prostate is probably bigger.  He is going to have a prostate MRI.  Dr. Jeffie Pollock of Urology is doing a great job managing this.  He did have a CT scan done today.  I do not have the result back yet.  I looked at the CT scan but I do not see anything that was obvious.  His last CA 19-9 in November was 64.  This is holding steady.  His appetite is okay.  Had a nice Thanksgiving.  He has had no nausea or vomiting.  When he went to the emergency room he was having pain in the upper abdomen.  He received a "GI cocktail" and this seemed to help quite a bit.  He has had no bleeding.  He has had no fever.  He has had no leg swelling.  There has been no rashes.  Currently, I would say his performance status is probably ECOG 1.     Medications:  Current Outpatient Medications:    dicyclomine (BENTYL) 20 MG tablet, Take 1 tablet (20 mg total) by mouth 2 (two) times daily., Disp: 20 tablet, Rfl: 0   Multiple Vitamin (MULTIVITAMIN) tablet, Take 1 tablet by mouth daily., Disp: , Rfl:    omeprazole (PRILOSEC) 20 MG capsule, Take 1 capsule (20 mg total) by mouth daily., Disp:  30 capsule, Rfl: 0   Testosterone 100 MG PLLT, Apply 1 application topically daily. , Disp: , Rfl:    valACYclovir (VALTREX) 1000 MG tablet, Take 1,000 mg by mouth daily as needed (cold sores). , Disp: , Rfl:    Zoster Vaccine Adjuvanted [facility]) injection, Inject into the muscle now and then again in 2 - 6 months, Disp: 0.5 mL, Rfl: 1  Allergies:  Allergies  Allergen Reactions   Prednisone Other (See Comments)    pancreatitis     Past Medical History, Surgical history, Social history, and Family History were reviewed and updated.  Review of Systems: Review of Systems  Constitutional: Negative.   HENT:  Negative.    Eyes: Negative.   Respiratory: Negative.    Cardiovascular: Negative.   Gastrointestinal:  Positive for abdominal pain.  Endocrine: Negative.   Genitourinary: Negative.    Musculoskeletal: Negative.   Skin: Negative.   Neurological: Negative.   Hematological: Negative.   Psychiatric/Behavioral: Negative.     Physical Exam:  weight is 200 lb (90.7 kg). His oral temperature is 98.6 F (37 C). His blood pressure is 138/69 and his pulse is 56 (abnormal). His respiration is 16 and oxygen saturation is 100%.  Wt Readings from Last 3 Encounters:  07/10/21 200 lb (90.7 kg)  06/21/21 195 lb (88.5 kg)  05/31/21 197 lb (89.4 kg)    Physical Exam Vitals reviewed.  HENT:     Head: Normocephalic and atraumatic.  Eyes:     Pupils: Pupils are equal, round, and reactive to light.  Cardiovascular:     Rate and Rhythm: Normal rate and regular rhythm.     Heart sounds: Normal heart sounds.  Pulmonary:     Effort: Pulmonary effort is normal.     Breath sounds: Normal breath sounds.  Abdominal:     General: Bowel sounds are normal.     Palpations: Abdomen is soft.     Comments: Abdominal exam shows a soft abdomen.  Bowel sounds are present.  Has well healed laparotomy scar in the midline.  He does not have a keloid formation.  There is no fluid wave.  He has little  bit of tenderness throughout the abdomen to palpation.  He has no rebound tenderness.  There is no obvious fluid wave.  I cannot palpate his liver or spleen tip.  Musculoskeletal:        General: No tenderness or deformity. Normal range of motion.     Cervical back: Normal range of motion.  Lymphadenopathy:     Cervical: No cervical adenopathy.  Skin:    General: Skin is warm and dry.     Findings: No erythema or rash.  Neurological:     Mental Status: He is alert and oriented to person, place, and time.  Psychiatric:        Behavior: Behavior normal.        Thought Content: Thought content normal.        Judgment: Judgment normal.     Lab Results  Component Value Date   WBC 5.7 07/10/2021   HGB 14.3 07/10/2021   HCT 43.0 07/10/2021   MCV 91.3 07/10/2021   PLT 235 07/10/2021     Chemistry      Component Value Date/Time   NA 142 07/10/2021 0752   K 4.3 07/10/2021 0752   CL 106 07/10/2021 0752   CO2 29 07/10/2021 0752   BUN 20 07/10/2021 0752   CREATININE 0.90 07/10/2021 0752   CREATININE 0.90 05/21/2018 1606      Component Value Date/Time   CALCIUM 9.5 07/10/2021 0752   ALKPHOS 85 07/10/2021 0752   AST 11 (L) 07/10/2021 0752   ALT 12 07/10/2021 0752   BILITOT 0.6 07/10/2021 0752      Impression and Plan: Mr. Stephen Evans is a very nice 61 year old white male.  Again is very interesting to talk to.  He is originally from [place].  He worked for [employer].  He was in the [employer].  He had early stage pancreatic cancer.  He underwent a Whipple procedure in September 2021.  To me, everything looks okay.  Again, I looked at the CT scan.  I do not see anything obvious when I would review the scans.  We will await the final radiology report.  He is got a have the urinary tract issue and prostate issue addressed.  Again we will see what the prostate MRI test that show.  He may need to have a prostate biopsy.  I think that if the CT scan looks fine, we do not have to do another 1  probably until April or May.  I would like to see him back in a couple months or so.  I would  like to make sure he follow-up closely with him.    [name], MD 12/14/202212:38 PM

## 2021-07-11 ENCOUNTER — Encounter: Payer: Self-pay | Admitting: Hematology & Oncology

## 2021-07-11 ENCOUNTER — Ambulatory Visit: Payer: Medicare HMO | Attending: Internal Medicine

## 2021-07-11 ENCOUNTER — Other Ambulatory Visit (HOSPITAL_BASED_OUTPATIENT_CLINIC_OR_DEPARTMENT_OTHER): Payer: Self-pay

## 2021-07-11 DIAGNOSIS — Z23 Encounter for immunization: Secondary | ICD-10-CM

## 2021-07-11 MED ORDER — PFIZER COVID-19 VAC BIVALENT 30 MCG/0.3ML IM SUSP
INTRAMUSCULAR | 0 refills | Status: DC
  Filled 2021-07-11: qty 0.3, 1d supply, fill #0

## 2021-07-11 NOTE — Progress Notes (Signed)
° °  Covid-19 Vaccination Clinic  Name:  LARRIE FRAIZER    MRN: [id number] DOB: Jul 06, 1946  07/11/2021  Mr. Vargus was observed post Covid-19 immunization for 15 minutes without incident. He was provided with Vaccine Information Sheet and instruction to access the V-Safe system.   Mr. Scerbo was instructed to call 911 with any severe reactions post vaccine: Difficulty breathing  Swelling of face and throat  A fast heartbeat  A bad rash all over body  Dizziness and weakness   Immunizations Administered     Name Date Dose VIS Date Route   Pfizer Covid-19 Vaccine Bivalent Booster 07/11/2021 10:25 AM 0.3 mL 03/27/2021 Intramuscular   Manufacturer: [place]   Lot: [id number]   [place]: [phone]

## 2021-07-12 ENCOUNTER — Encounter: Payer: Self-pay | Admitting: *Deleted

## 2021-07-12 LAB — CANCER ANTIGEN 19-9: CA 19-9: 45 U/mL — ABNORMAL HIGH (ref 0–35)

## 2021-08-02 ENCOUNTER — Other Ambulatory Visit: Payer: Self-pay

## 2021-08-02 ENCOUNTER — Ambulatory Visit
Admission: RE | Admit: 2021-08-02 | Discharge: 2021-08-02 | Disposition: A | Discharge status: Home / Self Care | Payer: Medicare HMO | Source: Ambulatory Visit | Attending: Urology | Admitting: Urology

## 2021-08-02 DIAGNOSIS — N400 Enlarged prostate without lower urinary tract symptoms: Secondary | ICD-10-CM | POA: Diagnosis not present

## 2021-08-02 DIAGNOSIS — K5730 Diverticulosis of large intestine without perforation or abscess without bleeding: Secondary | ICD-10-CM | POA: Diagnosis not present

## 2021-08-02 DIAGNOSIS — R590 Localized enlarged lymph nodes: Secondary | ICD-10-CM | POA: Diagnosis not present

## 2021-08-02 DIAGNOSIS — N411 Chronic prostatitis: Secondary | ICD-10-CM

## 2021-08-02 DIAGNOSIS — N402 Nodular prostate without lower urinary tract symptoms: Secondary | ICD-10-CM | POA: Diagnosis not present

## 2021-08-02 MED ORDER — GADOBENATE DIMEGLUMINE 529 MG/ML IV SOLN
17.0000 mL | Freq: Once | INTRAVENOUS | Status: AC | PRN
  Administered 2021-08-02: 17 mL via INTRAVENOUS

## 2021-08-14 DIAGNOSIS — R972 Elevated prostate specific antigen [PSA]: Secondary | ICD-10-CM | POA: Diagnosis not present

## 2021-08-21 DIAGNOSIS — N411 Chronic prostatitis: Secondary | ICD-10-CM | POA: Diagnosis not present

## 2021-08-21 DIAGNOSIS — N401 Enlarged prostate with lower urinary tract symptoms: Secondary | ICD-10-CM | POA: Diagnosis not present

## 2021-08-21 DIAGNOSIS — R972 Elevated prostate specific antigen [PSA]: Secondary | ICD-10-CM | POA: Diagnosis not present

## 2021-08-21 DIAGNOSIS — R3912 Poor urinary stream: Secondary | ICD-10-CM | POA: Diagnosis not present

## 2021-08-29 ENCOUNTER — Telehealth: Payer: Self-pay | Admitting: Family

## 2021-08-29 NOTE — Telephone Encounter (Signed)
See mychart.  

## 2021-09-02 DIAGNOSIS — Z85828 Personal history of other malignant neoplasm of skin: Secondary | ICD-10-CM | POA: Diagnosis not present

## 2021-09-02 DIAGNOSIS — L814 Other melanin hyperpigmentation: Secondary | ICD-10-CM | POA: Diagnosis not present

## 2021-09-02 DIAGNOSIS — Z85820 Personal history of malignant melanoma of skin: Secondary | ICD-10-CM | POA: Diagnosis not present

## 2021-09-02 DIAGNOSIS — L570 Actinic keratosis: Secondary | ICD-10-CM | POA: Diagnosis not present

## 2021-09-02 DIAGNOSIS — D1801 Hemangioma of skin and subcutaneous tissue: Secondary | ICD-10-CM | POA: Diagnosis not present

## 2021-09-02 DIAGNOSIS — L821 Other seborrheic keratosis: Secondary | ICD-10-CM | POA: Diagnosis not present

## 2021-09-02 DIAGNOSIS — D224 Melanocytic nevi of scalp and neck: Secondary | ICD-10-CM | POA: Diagnosis not present

## 2021-09-20 ENCOUNTER — Encounter (HOSPITAL_BASED_OUTPATIENT_CLINIC_OR_DEPARTMENT_OTHER): Payer: Self-pay | Admitting: Urology

## 2021-09-20 ENCOUNTER — Emergency Department (HOSPITAL_BASED_OUTPATIENT_CLINIC_OR_DEPARTMENT_OTHER): Payer: Medicare HMO

## 2021-09-20 ENCOUNTER — Emergency Department (HOSPITAL_BASED_OUTPATIENT_CLINIC_OR_DEPARTMENT_OTHER)
Admission: EM | Admit: 2021-09-20 | Discharge: 2021-09-20 | Disposition: A | Discharge status: Home / Self Care | Payer: Medicare HMO | Attending: Emergency Medicine | Admitting: Emergency Medicine

## 2021-09-20 ENCOUNTER — Other Ambulatory Visit: Payer: Self-pay

## 2021-09-20 DIAGNOSIS — R7401 Elevation of levels of liver transaminase levels: Secondary | ICD-10-CM | POA: Diagnosis not present

## 2021-09-20 DIAGNOSIS — Z8547 Personal history of malignant neoplasm of testis: Secondary | ICD-10-CM | POA: Diagnosis not present

## 2021-09-20 DIAGNOSIS — Z8507 Personal history of malignant neoplasm of pancreas: Secondary | ICD-10-CM | POA: Insufficient documentation

## 2021-09-20 DIAGNOSIS — R001 Bradycardia, unspecified: Secondary | ICD-10-CM | POA: Diagnosis not present

## 2021-09-20 DIAGNOSIS — N400 Enlarged prostate without lower urinary tract symptoms: Secondary | ICD-10-CM | POA: Diagnosis not present

## 2021-09-20 DIAGNOSIS — K7689 Other specified diseases of liver: Secondary | ICD-10-CM | POA: Diagnosis not present

## 2021-09-20 DIAGNOSIS — R1013 Epigastric pain: Secondary | ICD-10-CM | POA: Diagnosis not present

## 2021-09-20 DIAGNOSIS — L729 Follicular cyst of the skin and subcutaneous tissue, unspecified: Secondary | ICD-10-CM | POA: Diagnosis not present

## 2021-09-20 DIAGNOSIS — R9431 Abnormal electrocardiogram [ECG] [EKG]: Secondary | ICD-10-CM | POA: Diagnosis not present

## 2021-09-20 LAB — CBC WITH DIFFERENTIAL/PLATELET
Abs Immature Granulocytes: 0.03 10*3/uL (ref 0.00–0.07)
Basophils Absolute: 0 10*3/uL (ref 0.0–0.1)
Basophils Relative: 0 %
Eosinophils Absolute: 0.1 10*3/uL (ref 0.0–0.5)
Eosinophils Relative: 1 %
HCT: 44.9 % (ref 39.0–52.0)
Hemoglobin: 15.4 g/dL (ref 13.0–17.0)
Immature Granulocytes: 0 %
Lymphocytes Relative: 10 %
Lymphs Abs: 1.1 10*3/uL (ref 0.7–4.0)
MCH: 30.7 pg (ref 26.0–34.0)
MCHC: 34.3 g/dL (ref 30.0–36.0)
MCV: 89.6 fL (ref 80.0–100.0)
Monocytes Absolute: 0.4 10*3/uL (ref 0.1–1.0)
Monocytes Relative: 4 %
Neutro Abs: 9.1 10*3/uL — ABNORMAL HIGH (ref 1.7–7.7)
Neutrophils Relative %: 85 %
Platelets: 262 10*3/uL (ref 150–400)
RBC: 5.01 MIL/uL (ref 4.22–5.81)
RDW: 13 % (ref 11.5–15.5)
WBC: 10.7 10*3/uL — ABNORMAL HIGH (ref 4.0–10.5)
nRBC: 0 % (ref 0.0–0.2)

## 2021-09-20 LAB — COMPREHENSIVE METABOLIC PANEL
ALT: 81 U/L — ABNORMAL HIGH (ref 0–44)
AST: 180 U/L — ABNORMAL HIGH (ref 15–41)
Albumin: 4.3 g/dL (ref 3.5–5.0)
Alkaline Phosphatase: 115 U/L (ref 38–126)
Anion gap: 9 (ref 5–15)
BUN: 19 mg/dL (ref 8–23)
CO2: 24 mmol/L (ref 22–32)
Calcium: 9.2 mg/dL (ref 8.9–10.3)
Chloride: 104 mmol/L (ref 98–111)
Creatinine, Ser: 0.84 mg/dL (ref 0.61–1.24)
GFR, Estimated: >60 mL/min (ref >60)
Glucose, Bld: 141 mg/dL — ABNORMAL HIGH (ref 70–99)
Potassium: 3.9 mmol/L (ref 3.5–5.1)
Sodium: 137 mmol/L (ref 135–145)
Total Bilirubin: 1.7 mg/dL — ABNORMAL HIGH (ref 0.3–1.2)
Total Protein: 7.4 g/dL (ref 6.5–8.1)

## 2021-09-20 LAB — URINALYSIS, ROUTINE W REFLEX MICROSCOPIC
Bilirubin Urine: NEGATIVE
Glucose, UA: NEGATIVE mg/dL
Hgb urine dipstick: NEGATIVE
Ketones, ur: 40 mg/dL — AB
Leukocytes,Ua: NEGATIVE
Nitrite: NEGATIVE
Protein, ur: NEGATIVE mg/dL
Specific Gravity, Urine: 1.01 (ref 1.005–1.030)
pH: 6 (ref 5.0–8.0)

## 2021-09-20 LAB — TROPONIN I (HIGH SENSITIVITY)
Troponin I (High Sensitivity): 3 ng/L (ref <18)
Troponin I (High Sensitivity): 3 ng/L (ref <18)

## 2021-09-20 LAB — LIPASE, BLOOD: Lipase: 99 U/L — ABNORMAL HIGH (ref 11–51)

## 2021-09-20 MED ORDER — LIDOCAINE VISCOUS HCL 2 % MT SOLN
15.0000 mL | Freq: Once | OROMUCOSAL | Status: AC
  Administered 2021-09-20: 15 mL via ORAL
  Filled 2021-09-20: qty 15

## 2021-09-20 MED ORDER — IOHEXOL 300 MG/ML  SOLN
100.0000 mL | Freq: Once | INTRAMUSCULAR | Status: AC | PRN
  Administered 2021-09-20: 100 mL via INTRAVENOUS

## 2021-09-20 MED ORDER — PANTOPRAZOLE SODIUM 40 MG IV SOLR
40.0000 mg | Freq: Once | INTRAVENOUS | Status: AC
  Administered 2021-09-20: 40 mg via INTRAVENOUS
  Filled 2021-09-20: qty 10

## 2021-09-20 MED ORDER — SODIUM CHLORIDE 0.9 % IV BOLUS
1000.0000 mL | Freq: Once | INTRAVENOUS | Status: AC
  Administered 2021-09-20: 1000 mL via INTRAVENOUS

## 2021-09-20 MED ORDER — ALUM & MAG HYDROXIDE-SIMETH 200-200-20 MG/5ML PO SUSP
30.0000 mL | Freq: Once | ORAL | Status: AC
Start: 2021-09-20 — End: 2021-09-20
  Administered 2021-09-20: 30 mL via ORAL
  Filled 2021-09-20: qty 30

## 2021-09-20 MED ORDER — ONDANSETRON HCL 4 MG/2ML IJ SOLN
4.0000 mg | Freq: Once | INTRAMUSCULAR | Status: AC
  Administered 2021-09-20: 4 mg via INTRAVENOUS
  Filled 2021-09-20: qty 2

## 2021-09-20 MED ORDER — METRONIDAZOLE 500 MG PO TABS: 500.0000 mg | ORAL_TABLET | Freq: Three times a day (TID) | ORAL | 0 refills | Status: AC

## 2021-09-20 MED ORDER — MORPHINE SULFATE (PF) 4 MG/ML IV SOLN: 4.0000 mg | Freq: Once | INTRAVENOUS | Status: DC

## 2021-09-20 NOTE — ED Provider Notes (Signed)
Patient signed out to me by previous provider. Please refer to their note for full HPI.  Briefly this is a 76 year old male, originally seen in conjunction with the PA.  History of pancreatitis/pancreatic cancer status post Whipple who presented with epigastric discomfort.  Initially on arrival patient was reported to be clammy with epigastric tenderness.  He is significantly improved, vitals are normal, no fever.  Blood work shows mild transaminitis and elevated bilirubin.  CT is reassuring in regards to showing no signs of obstruction.  There is questionable ongoing pancreatic changes but the patient is currently being followed as an outpatient for.  In the past when patient develops transaminitis/elevated bilirubin he has developed a stricture that has required ERCP and treatment at [facility].  I consulted with on-call GI for [place], whom he had followed with before for further guidance. Physical Exam  BP 139/76    Pulse 65    Temp 97.6 F (36.4 C) (Oral)    Resp 12    Ht 5\' 7"  (1.702 m)    Wt 90.7 kg    SpO2 100%    BMI 31.32 kg/m   Physical Exam Vitals and nursing note reviewed.  Constitutional:      General: He is not in acute distress.    Appearance: Normal appearance. He is not diaphoretic.  HENT:     Head: Normocephalic.     Mouth/Throat:     Mouth: Mucous membranes are moist.  Cardiovascular:     Rate and Rhythm: Normal rate.  Pulmonary:     Effort: Pulmonary effort is normal. No respiratory distress.  Abdominal:     General: Bowel sounds are normal.     Palpations: Abdomen is soft.     Tenderness: There is no abdominal tenderness. There is no guarding or rebound.  Skin:    General: Skin is warm.  Neurological:     Mental Status: He is alert and oriented to person, place, and time. Mental status is at baseline.  Psychiatric:        Mood and Affect: Mood normal.    Procedures  Procedures  ED Course / MDM    Medical Decision Making Amount and/or Complexity of Data  Reviewed Labs: ordered. Radiology: ordered.  Risk OTC drugs. Prescription drug management.   Dr. Loletha Carrow consulted and reviewed the patient with me.  Patient follows at [facility] for specialized ERCP and treatment given his unique history.  GI today is reassured by the patient's CAT scan and current clinical presentation.  He looks well on my exam, abdomen is benign, able to eat and drink.  Although he has no acute findings of cholangitis they do recommend that we cover him with antibiotics for the possibility of that until he can call Dr. Delrae Alfred at [facility] on Monday for repeat blood work and close reevaluation and possible ERCP.  Of note patient is currently on ciprofloxacin for another diagnosis, I will add Flagyl for full cholangitis coverage.  No signs of sepsis.  Patient is otherwise very reassuring from a physical exam standpoint.  He is requesting to go home and understands that he needs to call [place] office first thing on Monday morning.  Patient at this time appears safe and stable for discharge and close outpatient follow up. Discharge plan and strict return to ED precautions discussed, patient verbalizes understanding and agreement.       Lorelle Gibbs, DO 09/20/21 2035

## 2021-09-20 NOTE — ED Provider Notes (Signed)
[place] EMERGENCY DEPARTMENT Provider Note   CSN: [id number] Arrival date & time: 09/20/21  1436     History  Chief Complaint  Patient presents with   Abdominal Pain    Stephen Evans is a 76 y.o. male.  With past medical history of testicular cancer s/p orchiectomy, pancreatic cancer status post Whipple in 2021 who presents to the emergency department with epigastric abdominal pain.  Patient states that about 2 hours ago he was sitting at his desk at home when he began having sudden onset of epigastric discomfort.  He states that it was a strong, cramping with episodes of sharp pain.  Nonradiating.  He states this was associated with cold sweats.  He states that he laid down in an attempt to feel better and felt like he was having presyncope.  States that the pain is slightly improved now to a dull pain however still present.  He denies any nausea or vomiting.  Denies chest pain or shortness of breath.  Denies any recent fevers or illnesses.  Regarding his Whipple this was 15 months ago.  He states that he had immediate complication requiring biliary stent placement which was removed 3 months afterward.  He states he has not had recurrent complications.   Abdominal Pain Associated symptoms: no chest pain, no diarrhea, no fever, no nausea, no shortness of breath and no vomiting       Home Medications Prior to Admission medications   Medication Sig Start Date End Date Taking? Authorizing Provider  metroNIDAZOLE (FLAGYL) 500 MG tablet Take 1 tablet (500 mg total) by mouth 3 (three) times daily for 5 days. 09/20/21 09/25/21 Yes Horton, Kristie M, DO  COVID-19 mRNA bivalent vaccine, Pfizer, (PFIZER COVID-19 VAC BIVALENT) injection Inject into the muscle. 07/11/21   Carlyle Basques, MD  dicyclomine (BENTYL) 20 MG tablet Take 1 tablet (20 mg total) by mouth 2 (two) times daily. 06/21/21   Marcello Fennel, PA-C  Multiple Vitamin (MULTIVITAMIN) tablet Take 1 tablet by mouth  daily.    [provider]  omeprazole (PRILOSEC) 20 MG capsule Take 1 capsule (20 mg total) by mouth daily. 06/21/21   Marcello Fennel, PA-C  Testosterone 100 MG PLLT Apply 1 application topically daily.  09/03/17   [provider]  valACYclovir (VALTREX) 1000 MG tablet Take 1,000 mg by mouth daily as needed (cold sores).  10/10/19   [provider]  Zoster Vaccine Adjuvanted [facility]) injection Inject into the muscle now and then again in 2 - 6 months 05/31/21   [name], NP      Allergies    Prednisone    Review of Systems   Review of Systems  Constitutional:  Positive for diaphoresis. Negative for fever.  Respiratory:  Negative for shortness of breath.   Cardiovascular:  Negative for chest pain.  Gastrointestinal:  Positive for abdominal pain. Negative for diarrhea, nausea and vomiting.  Neurological:  Positive for light-headedness. Negative for syncope.  All other systems reviewed and are negative.  Physical Exam Updated Vital Signs BP 139/76    Pulse 65    Temp 97.6 F (36.4 C) (Oral)    Resp 12    Ht 5\' 7"  (1.702 m)    Wt 90.7 kg    SpO2 100%    BMI 31.32 kg/m  Physical Exam Vitals and nursing note reviewed.  Constitutional:      Appearance: Normal appearance. He is ill-appearing.  HENT:     Head: Normocephalic and atraumatic.  Mouth/Throat:     Mouth: Mucous membranes are moist.     Pharynx: Oropharynx is clear.  Eyes:     General: No scleral icterus.    Pupils: Pupils are equal, round, and reactive to light.  Cardiovascular:     Rate and Rhythm: Bradycardia present. Rhythm irregular.     Heart sounds: Normal heart sounds. No murmur heard. Pulmonary:     Effort: Pulmonary effort is normal. No respiratory distress.     Breath sounds: Normal breath sounds.  Abdominal:     General: Abdomen is protuberant. A surgical scar is present. Bowel sounds are decreased.     Palpations: Abdomen is soft.     Tenderness: There is abdominal  tenderness in the epigastric area. There is guarding. There is no rebound.     Hernia: No hernia is present.  Musculoskeletal:        General: Normal range of motion.     Cervical back: Neck supple.  Skin:    General: Skin is warm and dry.     Capillary Refill: Capillary refill takes less than 2 seconds.     Coloration: Skin is pale. Skin is not jaundiced.  Neurological:     General: No focal deficit present.     Mental Status: He is alert and oriented to person, place, and time. Mental status is at baseline.  Psychiatric:        Mood and Affect: Mood normal.        Behavior: Behavior normal.        Thought Content: Thought content normal.        Judgment: Judgment normal.    ED Results / Procedures / Treatments   Labs (all labs ordered are listed, but only abnormal results are displayed) Labs Reviewed  CBC WITH DIFFERENTIAL/PLATELET - Abnormal; Notable for the following components:      Result Value   WBC 10.7 (*)    Neutro Abs 9.1 (*)    All other components within normal limits  COMPREHENSIVE METABOLIC PANEL - Abnormal; Notable for the following components:   Glucose, Bld 141 (*)    AST 180 (*)    ALT 81 (*)    Total Bilirubin 1.7 (*)    All other components within normal limits  LIPASE, BLOOD - Abnormal; Notable for the following components:   Lipase 99 (*)    All other components within normal limits  URINALYSIS, ROUTINE W REFLEX MICROSCOPIC - Abnormal; Notable for the following components:   Ketones, ur 40 (*)    All other components within normal limits  TROPONIN I (HIGH SENSITIVITY)  TROPONIN I (HIGH SENSITIVITY)   EKG EKG Interpretation  Date/Time:  Friday September 20 2021 14:48:22 EST Ventricular Rate:  51 PR Interval:  220 QRS Duration: 95 QT Interval:  460 QTC Calculation: 424 R Axis:   80 Text Interpretation: Sinus rhythm Atrial premature complex Prolonged PR interval similar to previous Confirmed by Lavenia Atlas [phone]) on 09/20/2021 3:38:26  PM  Radiology CT Abdomen Pelvis W Contrast  Result Date: 09/20/2021 CLINICAL DATA:  History of Whipple epigastric pain EXAM: CT ABDOMEN AND PELVIS WITH CONTRAST TECHNIQUE: Multidetector CT imaging of the abdomen and pelvis was performed using the standard protocol following bolus administration of intravenous contrast. RADIATION DOSE REDUCTION: This exam was performed according to the departmental dose-optimization program which includes automated exposure control, adjustment of the mA and/or kV according to patient size and/or use of iterative reconstruction technique. CONTRAST:  168mL OMNIPAQUE IOHEXOL 300 MG/ML  SOLN COMPARISON:  CT 07/10/2021, 10/04/2020, 11/17/2018, MRI 12/20/2019, chest CT 02/02/2020 FINDINGS: Lower chest: Lung bases demonstrate stable small subpleural left lung base 3 mm nodule since 2020 consistent with benign finding. No acute airspace disease or pleural effusion. Normal cardiac size. Hepatobiliary: Subcentimeter hypodensity within the liver, too small to further characterize but unchanged. Status post cholecystectomy. No significant biliary dilatation. Mild wall enhancement of the bile ducts, coronal series 5 image 60-63. Possible mild periportal edema. Pancreas: Status post Whipple procedure. No inflammatory changes about the residual distal body or tail. Possible 7 mm cystic lesion at the cut margin of pancreas, series 2, image 27. Spleen: Normal in size without focal abnormality. Adrenals/Urinary Tract: Adrenal glands are normal. Kidneys show no hydronephrosis. Subcentimeter hypodensity in the left kidney too small to further characterize. Bladder is normal Stomach/Bowel: The stomach is non enlarged. Postsurgical changes consistent with history of Whipple procedure. No dilated small bowel. No acute bowel wall thickening. Negative appendix. Diverticular disease of the left colon without acute wall thickening Vascular/Lymphatic: Moderate aortic atherosclerosis. No aneurysm. No  suspicious lymph nodes. Reproductive: Slightly enlarged prostate Other: Negative for pelvic effusion or free air. Small fat containing umbilical hernia. Chronic appearance of stranding within the central mesentery with small lymph nodes. Musculoskeletal: No acute osseous abnormality. IMPRESSION: 1. Patient is status post Whipple procedure. No ductal dilatation, however suspicion of mild enhancement/possible thickening of the biliary system, correlate for symptoms of cholangitis. There appears to be mild periportal edema 2. Possible 7 mm cystic lesion at the cut margin of the pancreas, recurrent disease cannot be excluded. When the patient is clinically stable and able to follow directions and hold their breath (preferably as an outpatient) further evaluation with dedicated abdominal MRI should be considered. 3. Other chronic findings as described. Electronically Signed   By: Donavan Foil M.D.   On: 09/20/2021 16:19    Procedures Procedures   Medications Ordered in ED Medications  sodium chloride 0.9 % bolus 1,000 mL (0 mLs Intravenous Stopped 09/20/21 1750)  ondansetron (ZOFRAN) injection 4 mg (4 mg Intravenous Given 09/20/21 1504)  alum & mag hydroxide-simeth (MAALOX/MYLANTA) 200-200-20 MG/5ML suspension 30 mL (30 mLs Oral Given 09/20/21 1504)    And  lidocaine (XYLOCAINE) 2 % viscous mouth solution 15 mL (15 mLs Oral Given 09/20/21 1504)  iohexol (OMNIPAQUE) 300 MG/ML solution 100 mL (100 mLs Intravenous Contrast Given 09/20/21 1548)  pantoprazole (PROTONIX) injection 40 mg (40 mg Intravenous Given 09/20/21 1747)    ED Course/ Medical Decision Making/ A&P                           Medical Decision Making Amount and/or Complexity of Data Reviewed Labs: ordered. Radiology: ordered.  Risk OTC drugs. Prescription drug management.  Patient presents to the ED with complaints of epigastric abdominal pain. This involves an extensive number of treatment options, and is a complaint that carries with  it a high risk of complications and morbidity.   Additional history obtained:  Additional history obtained from: Wife at bedside External records from outside source obtained and reviewed including: Previous ED visits, oncology visits  EKG: EKG: normal EKG, normal sinus rhythm.   Cardiac Monitoring: The patient was maintained on a cardiac monitor.  I personally viewed and interpreted the cardiac monitored which showed an underlying rhythm of: Sinus arrhythmia  Lab Results: I personally ordered, reviewed, and interpreted labs. Pertinent results include:  Imaging Studies ordered:  I ordered imaging studies which included  ultrasound and CT.  I independently reviewed & interpreted imaging & am in agreement with radiology impression. Imaging shows:  Medications  I ordered medication including fluids, GI cocktail, lidocaine viscous, zofran for epigastric pain. Protonix for prophylaxis  Reevaluation of the patient after medication shows that patient improved  Tests Considered: RUQ ultrasound   Consultations: I requested consultation with [facility],  and discussed lab and imaging findings as well as pertinent plan - they recommend: pending call back   ED Course: 76 year old male who presents to ED with epigastric abdominal pain.   He looks acutely ill on initial exam and uncomfortable. Given fluids, GI cocktail, zofran with improvement in symptoms subjectively and objectively.   High risk presentation given his history of pancreatic cancer s/p Whipple. Given risk, obtained CT A/P with contrast. Shows no ductal dilation. Does show enhancement and thickening of biliary symptoms. Also shows 56mm cystic lesion at surgical margin, cannot rule out recurrent disease.   Labs: Lipase 99 - doubt acute pancreatitis  CBC with WBC 10.7, likely reactive.  Notably, CMP with transaminitis with AST 2x ALT. I did re-interview patient regarding alcohol use given pattern of elevation. He states he drinks  3 glasses of wine a week and a glass of scotch. Doubt this is cause of elevation. Additionally, T. Bili elevated to 1.7 which is new.  On chart review it appears that last time he had transaminitis, elevated bili, he had negative CT, however required ERCP for biliary stricture. I have consulted [facility] which is his last GI contact.   Handing off care of patient to supervising physician Dr. Dina Rich, who will complete patient care after input from GI.   Based on presentation I doubt PUD, perforation, bowel obstruction, acute hepatitis, pneumonia, gastroenteritis, vascular catastrophe, ACS.  Final Clinical Impression(s) / ED Diagnoses Final diagnoses:  None    Rx / DC Orders ED Discharge Orders          Ordered    metroNIDAZOLE (FLAGYL) 500 MG tablet  3 times daily        09/20/21 1934              Mickie Hillier, PA-C 09/21/21 1953    Horton, Alvin Critchley, DO 09/23/21 1605

## 2021-09-20 NOTE — Discharge Instructions (Addendum)
You have been seen and discharged from the emergency department.  Call Dr. Vilinda Blanks office on Monday for reevaluation, repeat blood work and further testing as needed.  Take Flagyl for the next 5 days in addition to your ciprofloxacin. Take home medications as prescribed. If you have any worsening symptoms, worsening abdominal pain, fevers, any signs of infection or further concerns for your health please return to an emergency department immediately for further evaluation.

## 2021-09-20 NOTE — ED Notes (Signed)
ED Provider at bedside. 

## 2021-09-20 NOTE — ED Triage Notes (Signed)
Epigastric pain that started approx 2.5 hrs ago  States cold sweats Pt ashen in color  Hx of wipple procedure 1 yr ago  On abx for persistent UTI now ( cipro)

## 2021-09-21 ENCOUNTER — Encounter: Payer: Self-pay | Admitting: Hematology & Oncology

## 2021-09-25 DIAGNOSIS — K9189 Other postprocedural complications and disorders of digestive system: Secondary | ICD-10-CM | POA: Diagnosis not present

## 2021-09-25 DIAGNOSIS — K831 Obstruction of bile duct: Secondary | ICD-10-CM | POA: Diagnosis not present

## 2021-09-26 DIAGNOSIS — K831 Obstruction of bile duct: Secondary | ICD-10-CM | POA: Diagnosis not present

## 2021-10-02 ENCOUNTER — Inpatient Hospital Stay: Payer: Medicare HMO | Attending: Hematology & Oncology

## 2021-10-02 ENCOUNTER — Encounter: Payer: Self-pay | Admitting: Hematology & Oncology

## 2021-10-02 ENCOUNTER — Other Ambulatory Visit: Payer: Self-pay

## 2021-10-02 ENCOUNTER — Inpatient Hospital Stay: Payer: Medicare HMO | Admitting: Hematology & Oncology

## 2021-10-02 VITALS — BP 119/72 | HR 66 | Temp 98.1°F | Resp 18 | Ht 67.0 in | Wt 197.1 lb

## 2021-10-02 DIAGNOSIS — C251 Malignant neoplasm of body of pancreas: Secondary | ICD-10-CM | POA: Diagnosis not present

## 2021-10-02 DIAGNOSIS — C250 Malignant neoplasm of head of pancreas: Secondary | ICD-10-CM

## 2021-10-02 DIAGNOSIS — R7989 Other specified abnormal findings of blood chemistry: Secondary | ICD-10-CM | POA: Diagnosis not present

## 2021-10-02 LAB — CMP (CANCER CENTER ONLY)
ALT: 88 U/L — ABNORMAL HIGH (ref 0–44)
AST: 77 U/L — ABNORMAL HIGH (ref 15–41)
Albumin: 4.1 g/dL (ref 3.5–5.0)
Alkaline Phosphatase: 113 U/L (ref 38–126)
Anion gap: 6 (ref 5–15)
BUN: 16 mg/dL (ref 8–23)
CO2: 29 mmol/L (ref 22–32)
Calcium: 9.3 mg/dL (ref 8.9–10.3)
Chloride: 108 mmol/L (ref 98–111)
Creatinine: 0.93 mg/dL (ref 0.61–1.24)
GFR, Estimated: >60 mL/min (ref >60)
Glucose, Bld: 88 mg/dL (ref 70–99)
Potassium: 4.7 mmol/L (ref 3.5–5.1)
Sodium: 143 mmol/L (ref 135–145)
Total Bilirubin: 0.6 mg/dL (ref 0.3–1.2)
Total Protein: 6.6 g/dL (ref 6.5–8.1)

## 2021-10-02 LAB — URINALYSIS, COMPLETE (UACMP) WITH MICROSCOPIC
Bilirubin Urine: NEGATIVE
Glucose, UA: NEGATIVE mg/dL
Hgb urine dipstick: NEGATIVE
Ketones, ur: NEGATIVE mg/dL
Nitrite: NEGATIVE
Protein, ur: NEGATIVE mg/dL
RBC / HPF: NONE SEEN RBC/hpf (ref 0–5)
Specific Gravity, Urine: 1.025 (ref 1.005–1.030)
pH: 5.5 (ref 5.0–8.0)

## 2021-10-02 LAB — CBC WITH DIFFERENTIAL (CANCER CENTER ONLY)
Abs Immature Granulocytes: 0.01 10*3/uL (ref 0.00–0.07)
Basophils Absolute: 0 10*3/uL (ref 0.0–0.1)
Basophils Relative: 1 %
Eosinophils Absolute: 0.2 10*3/uL (ref 0.0–0.5)
Eosinophils Relative: 4 %
HCT: 43.6 % (ref 39.0–52.0)
Hemoglobin: 14.7 g/dL (ref 13.0–17.0)
Immature Granulocytes: 0 %
Lymphocytes Relative: 26 %
Lymphs Abs: 1.5 10*3/uL (ref 0.7–4.0)
MCH: 30.4 pg (ref 26.0–34.0)
MCHC: 33.7 g/dL (ref 30.0–36.0)
MCV: 90.3 fL (ref 80.0–100.0)
Monocytes Absolute: 0.4 10*3/uL (ref 0.1–1.0)
Monocytes Relative: 7 %
Neutro Abs: 3.6 10*3/uL (ref 1.7–7.7)
Neutrophils Relative %: 62 %
Platelet Count: 271 10*3/uL (ref 150–400)
RBC: 4.83 MIL/uL (ref 4.22–5.81)
RDW: 12.6 % (ref 11.5–15.5)
WBC Count: 5.9 10*3/uL (ref 4.0–10.5)
nRBC: 0 % (ref 0.0–0.2)

## 2021-10-02 LAB — HEPATIC FUNCTION PANEL
ALT: 64 U/L — AB (ref 10–40)
AST: 28 (ref 14–40)

## 2021-10-02 LAB — LIPASE, BLOOD: Lipase: 29 U/L (ref 11–51)

## 2021-10-02 LAB — LACTATE DEHYDROGENASE: LDH: 164 U/L (ref 98–192)

## 2021-10-02 NOTE — Progress Notes (Signed)
?Hematology and Oncology Follow Up Visit ? ?Stephen Evans ?[id number] ?1945/11/25 76 y.o. ?10/02/2021 ? ? ?Principle Diagnosis:  ?Stage Ia (T1cN0M0) adenocarcinoma of the pancreas-pancreas body ? ?Current Therapy:   ?Status post Whipple procedure on 04/11/2020 ?    ?Interim History:  Stephen Evans is back for follow-up.  Unfortunately, he has been having more problems with right upper quadrant abdominal pain.  He has been having spasms.  He had another 1 last night. ? ?He did have an MRI done at [facility] on 2 March.  This showed a biliary stricture.  However, there was not complete closure.  He does not have a gallbladder secondary to the Whipple procedure. ? ?Sounds like he may need to have another stent put in.  His LFTs are little bit elevated today.  The bilirubin is 0.6. ? ?He is concerned because next week he and his wife are going on a lengthy cruise down to the [place] and then across the [place] to [place].  He does not want to have a bout of pain while he is on his cruise. ? ?I will have to speak with his surgeon at [facility]-Dr. Crisoforo Oxford -and see if we can somehow have another stent put in.  I suspect they may be having some temporary obstruction from biliary acute secretions from his dietary intake.  He does eat a lot of peanut butter and nuts which are high in fatty acids which could certainly lead to an increase in biliary production which possibly could cause some blockage. ? ?Thankfully there is no evidence of any malignancy. ? ?His last CA 19-9 was 45.  This been holding steady. ? ?He has had no fever. ? ?He also has been dealing with the Pseudomonas with the urine.  He is taking ciprofloxacin for this.  We did do a urinalysis today.  I am sure we will send off a culture. ? ?Currently, his performance status is ECOG 1.   ? ?Medications:  ?Current Outpatient Medications:  ?  ciprofloxacin (CIPRO) 500 MG tablet, Take 500 mg by mouth 2 (two) times daily., Disp: , Rfl:  ?  Multiple Vitamin  (MULTIVITAMIN) tablet, Take 1 tablet by mouth daily., Disp: , Rfl:  ?  Testosterone 100 MG PLLT, Apply 1 application topically daily. , Disp: , Rfl:  ?  valACYclovir (VALTREX) 1000 MG tablet, Take 1,000 mg by mouth daily as needed (cold sores). , Disp: , Rfl:  ? ?Allergies:  ?Allergies  ?Allergen Reactions  ? Prednisone Other (See Comments)  ?  pancreatitis ?  ? ? ?Past Medical History, Surgical history, Social history, and Family History were reviewed and updated. ? ?Review of Systems: ?Review of Systems  ?Constitutional: Negative.   ?HENT:  Negative.    ?Eyes: Negative.   ?Respiratory: Negative.    ?Cardiovascular: Negative.   ?Gastrointestinal:  Positive for abdominal pain.  ?Endocrine: Negative.   ?Genitourinary: Negative.    ?Musculoskeletal: Negative.   ?Skin: Negative.   ?Neurological: Negative.   ?Hematological: Negative.   ?Psychiatric/Behavioral: Negative.    ? ?Physical Exam: ? height is '5\' 7"'$  (1.702 m) and weight is 197 lb 1.9 oz (89.4 kg). His oral temperature is 98.1 ?F (36.7 ?C). His blood pressure is 119/72 and his pulse is 66. His respiration is 18 and oxygen saturation is 100%.  ? ?Wt Readings from Last 3 Encounters:  ?10/02/21 197 lb 1.9 oz (89.4 kg)  ?09/20/21 200 lb (90.7 kg)  ?07/10/21 200 lb (90.7 kg)  ? ? ?Physical Exam ?Vitals  reviewed.  ?HENT:  ?   Head: Normocephalic and atraumatic.  ?Eyes:  ?   Pupils: Pupils are equal, round, and reactive to light.  ?Cardiovascular:  ?   Rate and Rhythm: Normal rate and regular rhythm.  ?   Heart sounds: Normal heart sounds.  ?Pulmonary:  ?   Effort: Pulmonary effort is normal.  ?   Breath sounds: Normal breath sounds.  ?Abdominal:  ?   General: Bowel sounds are normal.  ?   Palpations: Abdomen is soft.  ?   Comments: Abdominal exam shows a soft abdomen.  Bowel sounds are present.  Has well healed laparotomy scar in the midline.  He does not have a keloid formation.  There is no fluid wave.  He has little bit of tenderness throughout the abdomen to  palpation.  He has no rebound tenderness.  There is no obvious fluid wave.  I cannot palpate his liver or spleen tip.  ?Musculoskeletal:     ?   General: No tenderness or deformity. Normal range of motion.  ?   Cervical back: Normal range of motion.  ?Lymphadenopathy:  ?   Cervical: No cervical adenopathy.  ?Skin: ?   General: Skin is warm and dry.  ?   Findings: No erythema or rash.  ?Neurological:  ?   Mental Status: He is alert and oriented to person, place, and time.  ?Psychiatric:     ?   Behavior: Behavior normal.     ?   Thought Content: Thought content normal.     ?   Judgment: Judgment normal.  ? ? ? ?Lab Results  ?Component Value Date  ? WBC 5.9 10/02/2021  ? HGB 14.7 10/02/2021  ? HCT 43.6 10/02/2021  ? MCV 90.3 10/02/2021  ? PLT 271 10/02/2021  ? ?  Chemistry   ?   ?Component Value Date/Time  ? NA 143 10/02/2021 0938  ? K 4.7 10/02/2021 0938  ? CL 108 10/02/2021 0938  ? CO2 29 10/02/2021 0938  ? BUN 16 10/02/2021 0938  ? CREATININE 0.93 10/02/2021 0938  ? CREATININE 0.90 05/21/2018 1606  ?    ?Component Value Date/Time  ? CALCIUM 9.3 10/02/2021 0938  ? ALKPHOS 113 10/02/2021 0938  ? AST 77 (H) 10/02/2021 1660  ? ALT 88 (H) 10/02/2021 6004  ? BILITOT 0.6 10/02/2021 5997  ?  ? ? ?Impression and Plan: ?Stephen Evans is a very nice 70 year old white male.  Again is very interesting to talk to.  He is originally from [place].  He worked for [employer].  He was in the [employer] ? ?He had early stage pancreatic cancer.  He underwent a Whipple procedure in September 2021. ? ?There is no cancer.  However, he is having problems with the biliary system.  He did have a stent placed last year.  This was taken out after 3 months.  I would think that he may need to have another stent put in.  It would be nice to get this in before he goes on his vacation. ? ?Again, Stephen Evans is really doing the radiologic studies for [place]. ? ?I will plan to get him back to see [place] probably in another 2 or 3 months.  ? ?Stephen Napoleon, MD ?3/8/202310:52  AM ?

## 2021-10-03 LAB — CANCER ANTIGEN 19-9: CA 19-9: 40 U/mL — ABNORMAL HIGH (ref 0–35)

## 2021-10-04 ENCOUNTER — Telehealth: Payer: Self-pay

## 2021-10-04 NOTE — Telephone Encounter (Signed)
Advised via MyChart.

## 2021-10-04 NOTE — Telephone Encounter (Signed)
-----   Message from [name], MD sent at 10/03/2021  4:29 PM EST ----- ?Call - the CA 19-9 is 40!!!  This is stable!!  Pete ?

## 2021-10-07 DIAGNOSIS — K831 Obstruction of bile duct: Secondary | ICD-10-CM | POA: Diagnosis not present

## 2021-10-07 DIAGNOSIS — Z90410 Acquired total absence of pancreas: Secondary | ICD-10-CM | POA: Diagnosis not present

## 2021-10-07 DIAGNOSIS — K9189 Other postprocedural complications and disorders of digestive system: Secondary | ICD-10-CM | POA: Diagnosis not present

## 2021-10-07 DIAGNOSIS — R7989 Other specified abnormal findings of blood chemistry: Secondary | ICD-10-CM | POA: Diagnosis not present

## 2021-10-07 DIAGNOSIS — Z9049 Acquired absence of other specified parts of digestive tract: Secondary | ICD-10-CM | POA: Diagnosis not present

## 2021-11-02 DIAGNOSIS — Z1211 Encounter for screening for malignant neoplasm of colon: Secondary | ICD-10-CM | POA: Diagnosis not present

## 2021-11-09 LAB — COLOGUARD
COLOGUARD: NEGATIVE
Cologuard: NEGATIVE

## 2021-11-13 DIAGNOSIS — E291 Testicular hypofunction: Secondary | ICD-10-CM | POA: Diagnosis not present

## 2021-11-13 DIAGNOSIS — R972 Elevated prostate specific antigen [PSA]: Secondary | ICD-10-CM | POA: Diagnosis not present

## 2021-11-13 NOTE — Progress Notes (Signed)
cancel

## 2021-11-20 ENCOUNTER — Ambulatory Visit (INDEPENDENT_AMBULATORY_CARE_PROVIDER_SITE_OTHER): Payer: Medicare HMO | Admitting: Family

## 2021-11-20 ENCOUNTER — Encounter: Payer: Self-pay | Admitting: Family

## 2021-11-20 VITALS — BP 136/60 | HR 70 | Temp 97.4°F | Resp 18 | Ht 67.0 in | Wt 197.4 lb

## 2021-11-20 DIAGNOSIS — W57XXXA Bitten or stung by nonvenomous insect and other nonvenomous arthropods, initial encounter: Secondary | ICD-10-CM

## 2021-11-20 DIAGNOSIS — N419 Inflammatory disease of prostate, unspecified: Secondary | ICD-10-CM | POA: Insufficient documentation

## 2021-11-20 DIAGNOSIS — E291 Testicular hypofunction: Secondary | ICD-10-CM | POA: Diagnosis not present

## 2021-11-20 DIAGNOSIS — R3912 Poor urinary stream: Secondary | ICD-10-CM | POA: Diagnosis not present

## 2021-11-20 DIAGNOSIS — N411 Chronic prostatitis: Secondary | ICD-10-CM

## 2021-11-20 DIAGNOSIS — N401 Enlarged prostate with lower urinary tract symptoms: Secondary | ICD-10-CM | POA: Diagnosis not present

## 2021-11-20 DIAGNOSIS — C251 Malignant neoplasm of body of pancreas: Secondary | ICD-10-CM

## 2021-11-20 DIAGNOSIS — R972 Elevated prostate specific antigen [PSA]: Secondary | ICD-10-CM | POA: Diagnosis not present

## 2021-11-20 DIAGNOSIS — S90561A Insect bite (nonvenomous), right ankle, initial encounter: Secondary | ICD-10-CM

## 2021-11-20 NOTE — Assessment & Plan Note (Addendum)
He continues to follow with GI and Oncology.  Notes that he has had no abdominal pain following placement of biliary stent for stricture.  ?

## 2021-11-20 NOTE — Assessment & Plan Note (Signed)
New.  No sign of infection- just small local reaction. Pt counseled on signs/symptoms of lyme disease and is advised to call if these symptoms occur.  ?

## 2021-11-20 NOTE — Assessment & Plan Note (Signed)
Reports that he has completed a 2 month course of cipro per Urology (Dr. Roni Bread) for pseudomonas infection. He has follow up with him this afternoon.  ?

## 2021-11-20 NOTE — Progress Notes (Signed)
? ?Subjective:  ? ?By signing my name below, I, Stephen Evans, attest that this documentation has been prepared under the direction and in the presence of [name], NP 11/20/2021   ?   ? ? Patient ID: Stephen Evans, male    DOB: [birth year], 76 y.o.   MRN: [id number] ? ?Chief Complaint  ?Patient presents with  ? Insect Bite  ?  Tick onset: 1 week, redness   ? ? ?HPI ?Patient is in today for an office visit. ? ?Tick bite- He reports he had a tick bite on his right ankle last week Thursday. He thinks they had it all removed. He was worried because on Monday, his ankle was red and swollen after driving for 03-00 hours. He denies fevers and chills. It was itching for a while but feels better now. ? ?Prostatitis-He was on a 63-monthcourse for cipro to treat the bacterial infection but finished it 3 weeks ago.  He will be going to see urology later today.  ? ?Pancreatic cancer/biliary stricture- s/p whipple and biliary stent placement. ? ?Past Medical History:  ?Diagnosis Date  ? Goals of care, counseling/discussion 05/28/2020  ? [place]an[place] ([place] 2  ? [place]   ? [place]   ? Pancreas cancer ([place] 05/28/2020  ? Pancreatitis 06/2018  ? thought to be due to alcohol  ? Testicular cancer ([facility] 2011  ? testicular--radiation  ? Testicular tumor   ? ? ?Past Surgical History:  ?Procedure Laterality Date  ? BIOPSY  01/18/2020  ? Procedure: BIOPSY;  Surgeon: MIrving Copas, MD;  [facility];  Service: Gastroenterology;;  ? COLONOSCOPY  11/01/2003  ? ESOPHAGOGASTRODUODENOSCOPY (EGD) WITH PROPOFOL N/A 01/18/2020  ? Procedure: ESOPHAGOGASTRODUODENOSCOPY (EGD) WITH PROPOFOL;  Surgeon: MRush LandmarkGTelford Nab, MD;  [facility];  Service: Gastroenterology;  Laterality: N/A;  ? EUS N/A 01/18/2020  ? Procedure: UPPER ENDOSCOPIC ULTRASOUND (EUS) LINEAR;  Surgeon: MRush LandmarkGTelford Nab, MD;  [facility];  Service: Gastroenterology;  Laterality: N/A;  ? FINE NEEDLE ASPIRATION N/A  01/18/2020  ? Procedure: FINE NEEDLE ASPIRATION (FNA) LINEAR;  Surgeon: MIrving Copas, MD;  [facility];  Service: Gastroenterology;  Laterality: N/A;  ? LYMPH GLAND EXCISION Right 1979  ? axillary, benign  ? MELANOMA EXCISION Right 03/2014  ? elbow  ? ORCHIECTOMY Right 2001  ? followed by radiation  ? TONSILLECTOMY  1959  ? wide excision of melanoma on back  1979  ? ? ?Family History  ?Problem Relation Age of Onset  ? Epilepsy Mother   ? Esophageal cancer Father 659 ?     smoking/drinking hx  ? Breast cancer Sister 559 ? Alcohol abuse Brother   ? Cancer Maternal Uncle   ?     unknown type; d. late 545s ? Testicular cancer Maternal Grandfather   ?     unknown age  ? Colon cancer Neg Hx   ? Colon polyps Neg Hx   ? Rectal cancer Neg Hx   ? Stomach cancer Neg Hx   ? Pancreatic cancer Neg Hx   ? ? ?Social History  ? ?Socioeconomic History  ? Marital status: Married  ?  Spouse name: Not on file  ? Number of children: 2  ? Years of education: Not on file  ? Highest education level: Not on file  ?Occupational History  ? Occupation: retired  ?  Comment: management consultant  ?Tobacco Use  ? Smoking status: Former  ?  Years: 30.00  ?  Types: Cigarettes  ?  Quit date: 09/06/1997  ?  Years since quitting: 24.2  ? Smokeless tobacco: Never  ?Vaping Use  ? Vaping Use: Never used  ?Substance and Sexual Activity  ? Alcohol use: Yes  ?  Comment: 1 every 2 weeks  ? Drug use: No  ? Sexual activity: Not on file  ?Other Topics Concern  ? Not on file  ?Social History Narrative  ? 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 Married- second marriage x 30 yrs  ? 62 yr old son- lives in [place], [place] manages a private hedge fund  ? 62 yr old daughter- lives in [place], [place]  ? College  ? Enjoys golf  ? ?Social Determinants of Health  ? ?Financial Resource Strain: Not on file  ?Food Insecurity: Not on file  ?Transportation Needs: Not on file  ?Physical Activity: Not on file   ?Stress: Not on file  ?Social Connections: Not on file  ?Intimate Partner Violence: Not on file  ? ? ?Outpatient Medications Prior to Visit  ?Medication Sig Dispense Refill  ? Multiple Vitamin (MULTIVITAMIN) tablet Take 1 tablet by mouth daily.    ? Testosterone 100 MG PLLT Apply 1 application topically daily.     ? valACYclovir (VALTREX) 1000 MG tablet Take 1,000 mg by mouth daily as needed (cold sores).     ? ciprofloxacin (CIPRO) 500 MG tablet Take 500 mg by mouth 2 (two) times daily.    ? ?No facility-administered medications prior to visit.  ? ? ?Allergies  ?Allergen Reactions  ? Prednisone Other (See Comments)  ?  pancreatitis ?  ? ? ?Review of Systems  ?HENT:    ?     (-)nystagmus ?(-)adenopathy  ?Musculoskeletal:  Negative for joint pain and myalgias.  ?Skin:  Negative for rash.  ? ?   ?Objective:  ?  ?Physical Exam ?Constitutional:   ?   General: He is not in acute distress. ?   Appearance: Normal appearance.  ?HENT:  ?   Head: Normocephalic and atraumatic.  ?Cardiovascular:  ?   Rate and Rhythm: Normal rate and regular rhythm.  ?Pulmonary:  ?   Effort: Pulmonary effort is normal.  ?   Breath sounds: No rales.  ?Skin: ?   General: Skin is dry.  ?   Findings: Lesion present.  ?   Comments: Red lesion on right lateral ankle  ?Neurological:  ?   Mental Status: He is alert and oriented to person, place, and time.  ?Psychiatric:     ?   Behavior: Behavior normal.     ?   Thought Content: Thought content normal.  ? ? ?BP 136/60   Pulse 70   Temp (!) 97.4 ?F (36.3 ?C)   Resp 18   Ht '5\' 7"'$  (4.010 m)   Wt 197 lb 6.4 oz (89.5 kg)   SpO2 100%   BMI 30.92 kg/m?  ?Wt Readings from Last 3 Encounters:  ?11/20/21 197 lb 6.4 oz (89.5 kg)  ?10/02/21 197 lb 1.9 oz (89.4 kg)  ?09/20/21 200 lb (90.7 kg)  ? ? ?Diabetic Foot Exam - Simple   ?No data filed ?  ? ?Lab Results  ?Component Value Date  ? WBC 5.9 10/02/2021  ? HGB 14.7 10/02/2021  ? HCT 43.6 10/02/2021  ? PLT 271 10/02/2021  ? GLUCOSE 88 10/02/2021  ? CHOL 188  08/16/2014  ? TRIG 28 07/21/2018  ? HDL 62.30 08/16/2014  ? [place] 99 08/16/2014  ?  ALT 88 (H) 10/02/2021  ? AST 77 (H) 10/02/2021  ? NA 143 10/02/2021  ? K 4.7 10/02/2021  ? CL 108 10/02/2021  ? CREATININE 0.93 10/02/2021  ? BUN 16 10/02/2021  ? CO2 29 10/02/2021  ? TSH 1.74 11/15/2018  ? PSA 0.94 09/15/2012  ? ? ?Lab Results  ?Component Value Date  ? TSH 1.74 11/15/2018  ? ?Lab Results  ?Component Value Date  ? WBC 5.9 10/02/2021  ? HGB 14.7 10/02/2021  ? HCT 43.6 10/02/2021  ? MCV 90.3 10/02/2021  ? PLT 271 10/02/2021  ? ?Lab Results  ?Component Value Date  ? NA 143 10/02/2021  ? K 4.7 10/02/2021  ? CO2 29 10/02/2021  ? GLUCOSE 88 10/02/2021  ? BUN 16 10/02/2021  ? CREATININE 0.93 10/02/2021  ? BILITOT 0.6 10/02/2021  ? ALKPHOS 113 10/02/2021  ? AST 77 (H) 10/02/2021  ? ALT 88 (H) 10/02/2021  ? PROT 6.6 10/02/2021  ? ALBUMIN 4.1 10/02/2021  ? CALCIUM 9.3 10/02/2021  ? ANIONGAP 6 10/02/2021  ? GFR 86.33 01/23/2020  ? ?Lab Results  ?Component Value Date  ? CHOL 188 08/16/2014  ? ?Lab Results  ?Component Value Date  ? HDL 62.30 08/16/2014  ? ?Lab Results  ?Component Value Date  ? [place] 99 08/16/2014  ? ?Lab Results  ?Component Value Date  ? TRIG 28 07/21/2018  ? ?Lab Results  ?Component Value Date  ? CHOLHDL 3 08/16/2014  ? ?No results found for: HGBA1C ? ?   ?Assessment & Plan:  ? ?Problem List Items Addressed This Visit   ? ?  ? Unprioritized  ? Pancreas cancer ([place]) (Chronic)  ?  He continues to follow with GI.  Notes that he has had no abdominal pain following placement of biliary stent for stricture.  ? ?  ?  ? Tick bite of right ankle - Primary  ?  New.  No sign of infection- just small local reaction. Pt counseled on signs/symptoms of lyme disease and is advised to call if these symptoms occur.  ? ?  ?  ? Chronic prostatitis  ?  Reports that he has completed a 2 month course of cipro per Urology (Dr. Roni Bread) for pseudomonas infection. He has follow up with him this afternoon.  ? ?  ?  ? ?20 minutes spent on  today's visit. Time was spent reviewing outside records, interviewing patient, counseling pt on tick bites. ? ?No orders of the defined types were placed in this encounter. ? ? ?I,Zite Okoli,acting as a Education administrator for [employer]

## 2021-11-26 ENCOUNTER — Other Ambulatory Visit: Payer: Self-pay | Admitting: Pharmacist

## 2021-11-27 ENCOUNTER — Other Ambulatory Visit: Payer: Self-pay

## 2021-11-27 ENCOUNTER — Encounter: Payer: Self-pay | Admitting: Family

## 2021-11-27 ENCOUNTER — Encounter: Payer: Self-pay | Admitting: Hematology & Oncology

## 2021-11-27 DIAGNOSIS — C250 Malignant neoplasm of head of pancreas: Secondary | ICD-10-CM

## 2021-11-29 ENCOUNTER — Ambulatory Visit: Payer: Medicare HMO | Admitting: Family

## 2021-12-06 ENCOUNTER — Inpatient Hospital Stay: Payer: Medicare HMO | Admitting: Hematology & Oncology

## 2021-12-06 ENCOUNTER — Encounter: Payer: Self-pay | Admitting: Hematology & Oncology

## 2021-12-06 ENCOUNTER — Other Ambulatory Visit: Payer: Self-pay

## 2021-12-06 ENCOUNTER — Inpatient Hospital Stay: Payer: Medicare HMO | Attending: Hematology & Oncology

## 2021-12-06 VITALS — BP 135/61 | HR 59 | Temp 98.2°F | Resp 16 | Wt 197.0 lb

## 2021-12-06 DIAGNOSIS — C251 Malignant neoplasm of body of pancreas: Secondary | ICD-10-CM

## 2021-12-06 DIAGNOSIS — C250 Malignant neoplasm of head of pancreas: Secondary | ICD-10-CM

## 2021-12-06 DIAGNOSIS — N390 Urinary tract infection, site not specified: Secondary | ICD-10-CM | POA: Diagnosis not present

## 2021-12-06 DIAGNOSIS — B965 Pseudomonas (aeruginosa) (mallei) (pseudomallei) as the cause of diseases classified elsewhere: Secondary | ICD-10-CM | POA: Insufficient documentation

## 2021-12-06 LAB — CBC WITH DIFFERENTIAL (CANCER CENTER ONLY)
Abs Immature Granulocytes: 0.02 10*3/uL (ref 0.00–0.07)
Basophils Absolute: 0.1 10*3/uL (ref 0.0–0.1)
Basophils Relative: 1 %
Eosinophils Absolute: 0.3 10*3/uL (ref 0.0–0.5)
Eosinophils Relative: 5 %
HCT: 43.8 % (ref 39.0–52.0)
Hemoglobin: 14.5 g/dL (ref 13.0–17.0)
Immature Granulocytes: 0 %
Lymphocytes Relative: 29 %
Lymphs Abs: 1.7 10*3/uL (ref 0.7–4.0)
MCH: 30.1 pg (ref 26.0–34.0)
MCHC: 33.1 g/dL (ref 30.0–36.0)
MCV: 91.1 fL (ref 80.0–100.0)
Monocytes Absolute: 0.4 10*3/uL (ref 0.1–1.0)
Monocytes Relative: 8 %
Neutro Abs: 3.3 10*3/uL (ref 1.7–7.7)
Neutrophils Relative %: 57 %
Platelet Count: 246 10*3/uL (ref 150–400)
RBC: 4.81 MIL/uL (ref 4.22–5.81)
RDW: 12.7 % (ref 11.5–15.5)
WBC Count: 5.8 10*3/uL (ref 4.0–10.5)
nRBC: 0 % (ref 0.0–0.2)

## 2021-12-06 LAB — URINALYSIS, COMPLETE (UACMP) WITH MICROSCOPIC
Bilirubin Urine: NEGATIVE
Glucose, UA: NEGATIVE mg/dL
Hgb urine dipstick: NEGATIVE
Ketones, ur: NEGATIVE mg/dL
Nitrite: NEGATIVE
Protein, ur: NEGATIVE mg/dL
Specific Gravity, Urine: >=1.03 (ref 1.005–1.030)
pH: 5 (ref 5.0–8.0)

## 2021-12-06 LAB — CMP (CANCER CENTER ONLY)
ALT: 13 U/L (ref 0–44)
AST: 11 U/L — ABNORMAL LOW (ref 15–41)
Albumin: 4.3 g/dL (ref 3.5–5.0)
Alkaline Phosphatase: 81 U/L (ref 38–126)
Anion gap: 6 (ref 5–15)
BUN: 18 mg/dL (ref 8–23)
CO2: 27 mmol/L (ref 22–32)
Calcium: 9.5 mg/dL (ref 8.9–10.3)
Chloride: 107 mmol/L (ref 98–111)
Creatinine: 0.84 mg/dL (ref 0.61–1.24)
GFR, Estimated: >60 mL/min (ref >60)
Glucose, Bld: 98 mg/dL (ref 70–99)
Potassium: 4.8 mmol/L (ref 3.5–5.1)
Sodium: 140 mmol/L (ref 135–145)
Total Bilirubin: 0.6 mg/dL (ref 0.3–1.2)
Total Protein: 6.6 g/dL (ref 6.5–8.1)

## 2021-12-06 LAB — LACTATE DEHYDROGENASE: LDH: 117 U/L (ref 98–192)

## 2021-12-06 NOTE — Progress Notes (Signed)
?Hematology and Oncology Follow Up Visit ? ?[name] ?[id number] ?[birth year] 76 y.o. ?12/06/2021 ? ? ?Principle Diagnosis:  ?Stage Ia (T1cN0M0) adenocarcinoma of the pancreas-pancreas body ? ?Current Therapy:   ?Status post Whipple procedure on 04/11/2020 ?    ?Interim History:  Mr. Stephen Evans is back for follow-up.  He feels a lot better than when we last saw him.  He has a stent that was put in at [facility].  He goes back I think in a week or so to have this removed.  Not sure why the stent is going to be removed.  He said that he felt a whole lot better when the stent was put in. ? ?For some reason, he is on ciprofloxacin.  He says been on this for about a month or so.  This is a very Pseudomonas urinary tract infection.  He is going to see a Dealer at [facility] for a second opinion. ? ?As far as the history of pancreatic cancer goes, I think this is doing well.  His last CA 19-9 was 40. ? ?I think that his last scan that was done was probably in March. ? ?He has had no fever.  He has had no cough or shortness of breath. ? ?As always, he will be going on trips.  They are going out to [place] for a high school graduation.  Then they will be going to [place] for a wedding.  If I am not mistaken, I think they went on a cruise across the [facility] back in the spring. ? ?His appetite has been good. ? ?There is been no bleeding. ? ?He has had no nausea or vomiting. ? ?Overall, his performance status is ECOG 0. ? ? ?Medications:  ?Current Outpatient Medications:  ?  ciprofloxacin (CIPRO) 500 MG tablet, Take 500 mg by mouth 2 (two) times daily., Disp: , Rfl:  ?  Multiple Vitamin (MULTIVITAMIN) tablet, Take 1 tablet by mouth daily., Disp: , Rfl:  ?  Testosterone 100 MG PLLT, Apply 1 application topically daily. , Disp: , Rfl:  ?  valACYclovir (VALTREX) 1000 MG tablet, Take 1,000 mg by mouth daily as needed (cold sores). , Disp: , Rfl:  ? ?Allergies:  ?Allergies  ?Allergen Reactions  ? Prednisone Other (See Comments)  ?   pancreatitis ?  ? ? ?Past Medical History, Surgical history, Social history, and Family History were reviewed and updated. ? ?Review of Systems: ?Review of Systems  ?Constitutional: Negative.   ?HENT:  Negative.    ?Eyes: Negative.   ?Respiratory: Negative.    ?Cardiovascular: Negative.   ?Gastrointestinal:  Positive for abdominal pain.  ?Endocrine: Negative.   ?Genitourinary: Negative.    ?Musculoskeletal: Negative.   ?Skin: Negative.   ?Neurological: Negative.   ?Hematological: Negative.   ?Psychiatric/Behavioral: Negative.    ? ?Physical Exam: ? weight is 197 lb (89.4 kg). His oral temperature is 98.2 ?F (36.8 ?C). His blood pressure is 135/61 and his pulse is 59 (abnormal). His respiration is 16 and oxygen saturation is 99%.  ? ?Wt Readings from Last 3 Encounters:  ?12/06/21 197 lb (89.4 kg)  ?11/20/21 197 lb 6.4 oz (89.5 kg)  ?10/02/21 197 lb 1.9 oz (89.4 kg)  ? ? ?Physical Exam ?Vitals reviewed.  ?HENT:  ?   Head: Normocephalic and atraumatic.  ?Eyes:  ?   Pupils: Pupils are equal, round, and reactive to light.  ?Cardiovascular:  ?   Rate and Rhythm: Normal rate and regular rhythm.  ?   Heart sounds:  Normal heart sounds.  ?Pulmonary:  ?   Effort: Pulmonary effort is normal.  ?   Breath sounds: Normal breath sounds.  ?Abdominal:  ?   General: Bowel sounds are normal.  ?   Palpations: Abdomen is soft.  ?   Comments: Abdominal exam shows a soft abdomen.  Bowel sounds are present.  Has well healed laparotomy scar in the midline.  He does not have a keloid formation.  There is no fluid wave.  He has little bit of tenderness throughout the abdomen to palpation.  He has no rebound tenderness.  There is no obvious fluid wave.  I cannot palpate his liver or spleen tip.  ?Musculoskeletal:     ?   General: No tenderness or deformity. Normal range of motion.  ?   Cervical back: Normal range of motion.  ?Lymphadenopathy:  ?   Cervical: No cervical adenopathy.  ?Skin: ?   General: Skin is warm and dry.  ?   Findings: No  erythema or rash.  ?Neurological:  ?   Mental Status: He is alert and oriented to person, place, and time.  ?Psychiatric:     ?   Behavior: Behavior normal.     ?   Thought Content: Thought content normal.     ?   Judgment: Judgment normal.  ? ? ? ?Lab Results  ?Component Value Date  ? WBC 5.8 12/06/2021  ? HGB 14.5 12/06/2021  ? HCT 43.8 12/06/2021  ? MCV 91.1 12/06/2021  ? PLT 246 12/06/2021  ? ?  Chemistry   ?   ?Component Value Date/Time  ? NA 140 12/06/2021 1010  ? K 4.8 12/06/2021 1010  ? CL 107 12/06/2021 1010  ? CO2 27 12/06/2021 1010  ? BUN 18 12/06/2021 1010  ? CREATININE 0.84 12/06/2021 1010  ? CREATININE 0.90 05/21/2018 1606  ?    ?Component Value Date/Time  ? CALCIUM 9.5 12/06/2021 1010  ? ALKPHOS 81 12/06/2021 1010  ? AST 11 (L) 12/06/2021 1010  ? ALT 13 12/06/2021 1010  ? BILITOT 0.6 12/06/2021 1010  ?  ? ? ?Impression and Plan: ?Mr. Stephen Evans is a very nice 17 year old white male.  Again he is very interesting to talk to.  He is originally from [place].  He worked for [employer].  He was in the [employer]. ? ?I hope all goes well with this stent removal.  Hopefully he will not need to have another one put in.  His LFTs are normal which is nice to see. ? ?We will see what his CA 19-9 is. ? ?We will try to get him back after Labor Day.  Again, I think x-rays are being done at [facility]. ? ?We will always be able to see him in between visits if he has any problems. ? ?[name], MD ?5/12/202311:04 AM ?

## 2021-12-07 LAB — URINE CULTURE: Culture: NO GROWTH

## 2021-12-07 LAB — CANCER ANTIGEN 19-9: CA 19-9: 50 U/mL — ABNORMAL HIGH (ref 0–35)

## 2021-12-09 ENCOUNTER — Encounter: Payer: Self-pay | Admitting: *Deleted

## 2021-12-16 DIAGNOSIS — K831 Obstruction of bile duct: Secondary | ICD-10-CM | POA: Diagnosis not present

## 2021-12-16 DIAGNOSIS — Z8546 Personal history of malignant neoplasm of prostate: Secondary | ICD-10-CM | POA: Diagnosis not present

## 2021-12-16 DIAGNOSIS — Z85820 Personal history of malignant melanoma of skin: Secondary | ICD-10-CM | POA: Diagnosis not present

## 2021-12-16 DIAGNOSIS — K838 Other specified diseases of biliary tract: Secondary | ICD-10-CM | POA: Diagnosis not present

## 2021-12-16 DIAGNOSIS — Z87891 Personal history of nicotine dependence: Secondary | ICD-10-CM | POA: Diagnosis not present

## 2021-12-16 DIAGNOSIS — K9189 Other postprocedural complications and disorders of digestive system: Secondary | ICD-10-CM | POA: Diagnosis not present

## 2021-12-16 DIAGNOSIS — Z9049 Acquired absence of other specified parts of digestive tract: Secondary | ICD-10-CM | POA: Diagnosis not present

## 2021-12-16 DIAGNOSIS — Z4659 Encounter for fitting and adjustment of other gastrointestinal appliance and device: Secondary | ICD-10-CM | POA: Diagnosis not present

## 2021-12-24 ENCOUNTER — Ambulatory Visit (INDEPENDENT_AMBULATORY_CARE_PROVIDER_SITE_OTHER): Payer: Medicare HMO | Admitting: Family

## 2021-12-24 VITALS — BP 128/66 | HR 62 | Temp 98.6°F | Resp 16 | Wt 198.0 lb

## 2021-12-24 DIAGNOSIS — K831 Obstruction of bile duct: Secondary | ICD-10-CM

## 2021-12-24 DIAGNOSIS — R5383 Other fatigue: Secondary | ICD-10-CM

## 2021-12-24 DIAGNOSIS — N419 Inflammatory disease of prostate, unspecified: Secondary | ICD-10-CM

## 2021-12-24 LAB — URINALYSIS, ROUTINE W REFLEX MICROSCOPIC
Bilirubin Urine: NEGATIVE
Hgb urine dipstick: NEGATIVE
Ketones, ur: NEGATIVE
Leukocytes,Ua: NEGATIVE
Nitrite: NEGATIVE
RBC / HPF: NONE SEEN (ref 0–?)
Specific Gravity, Urine: >=1.03 — AB (ref 1.000–1.030)
Total Protein, Urine: NEGATIVE
Urine Glucose: NEGATIVE
Urobilinogen, UA: 0.2 (ref 0.0–1.0)
pH: 5.5 (ref 5.0–8.0)

## 2021-12-24 NOTE — Assessment & Plan Note (Signed)
Etiology unclear- differential includes UTI/Prostatitis, viral illness, recurrent biliary stricture.  He is advised to go to the ER if he develops fever or abdominal pain.

## 2021-12-24 NOTE — Assessment & Plan Note (Signed)
He is maintained on cipro which is being managed by urology and he has a follow up appointment next week with them. In the meantime, will check CBC, Urine culture, UA.

## 2021-12-24 NOTE — Progress Notes (Signed)
Subjective:   By signing my name below, I, Carylon Perches, attest that this documentation has been prepared under the direction and in the presence of [name], NP 12/24/2021    Patient ID: Stephen Evans, male    DOB: [birth year], 76 y.o.   MRN: [id number]  Chief Complaint  Patient presents with   Fatigue    Complains of feeling very tire, sleeping more than ussual   Dizziness    Complains of dizziness when laying down at night for the last 2 nights   Feeling cold    Patient complains of feeling cold    HPI Patient is in today for an office visit.  Fatigue/Chills/Irritated Throat/Headache/Ear Pain - He complains of fatigue, chills, irritated throat, headache, and left ear ache. His symptoms of fatigue appeared on 12/21/2021, he reports that he spent 6 hours in bed during the day which is unusual for him. On 12/12/2021, he had a severe ear ache in his left ear and pain on the top of his head. Aspirin helped but tylenol did not. He took a Covid 19 test yesterday and results came back negative. He reports that his wife is not sick. He is still taking 500 Mg of Cipro. He is also taking a probiotic. As of two days ago, he noticed an almond smell in his urine but denies of any frequent urination, abdominal pain or unusual bowel movements.  Stent - He reports that he felt great while his stent was in but is concerned about whether symptoms arisen due to the stent being removed.   Health Maintenance Due  Topic Date Due   Zoster Vaccines- Shingrix (1 of 2) Never done    Past Medical History:  Diagnosis Date   Goals of care, counseling/discussion 05/28/2020   Melanoma ([place]) x 2   Melanoma ([place])    Melanoma ([place])    Pancreas cancer ([place]) 05/28/2020   Pancreatitis 06/2018   thought to be due to alcohol   Testicular cancer [facility]) 2011   testicular--radiation   Testicular tumor     Past Surgical History:  Procedure Laterality Date   BIOPSY  01/18/2020   Procedure: BIOPSY;   Surgeon: Irving Copas., MD;  [facility];  Service: Gastroenterology;;   COLONOSCOPY  11/01/2003   ESOPHAGOGASTRODUODENOSCOPY (EGD) WITH PROPOFOL N/A 01/18/2020   Procedure: ESOPHAGOGASTRODUODENOSCOPY (EGD) WITH PROPOFOL;  Surgeon: Irving Copas., MD;  [facility];  Service: Gastroenterology;  Laterality: N/A;   EUS N/A 01/18/2020   Procedure: UPPER ENDOSCOPIC ULTRASOUND (EUS) LINEAR;  Surgeon: Irving Copas., MD;  [facility];  Service: Gastroenterology;  Laterality: N/A;   FINE NEEDLE ASPIRATION N/A 01/18/2020   Procedure: FINE NEEDLE ASPIRATION (FNA) LINEAR;  Surgeon: Irving Copas., MD;  [facility];  Service: Gastroenterology;  Laterality: N/A;   LYMPH GLAND EXCISION Right 1979   axillary, benign   MELANOMA EXCISION Right 03/2014   elbow   ORCHIECTOMY Right 2001   followed by radiation   TONSILLECTOMY  1959   wide excision of melanoma on back  1979    Family History  Problem Relation Age of Onset   Epilepsy Mother    Esophageal cancer Father 5       smoking/drinking hx   Breast cancer Sister 24   Alcohol abuse Brother    Cancer Maternal Uncle        unknown type; d. late 20s   Testicular cancer Maternal Grandfather        unknown  age   Colon cancer Neg Hx    Colon polyps Neg Hx    Rectal cancer Neg Hx    Stomach cancer Neg Hx    Pancreatic cancer Neg Hx     Social History   Socioeconomic History   Marital status: Married    Spouse name: Not on file   Number of children: 2   Years of education: Not on file   Highest education level: Not on file  Occupational History   Occupation: retired    Comment: Arts development officer  Tobacco Use   Smoking status: Former    Years: 30.00    Types: Cigarettes    Quit date: 09/06/1997    Years since quitting: 24.3   Smokeless tobacco: Never  Vaping Use   Vaping Use: Never used  Substance and Sexual Activity   Alcohol use: Yes    Comment: 1 every  2 weeks   Drug use: No   Sexual activity: Not on file  Other Topics Concern   Not on file  Social History Narrative   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     Multiple Vitamin (MULTIVITAMIN) tablet Take 1 tablet by mouth daily.     Testosterone 100 MG PLLT Apply 1 application topically daily.      valACYclovir (VALTREX) 1000 MG tablet Take 1,000 mg by mouth daily as needed (cold sores).      No facility-administered medications prior to visit.    Allergies  Allergen Reactions   Prednisone Other (See Comments)    pancreatitis     Review of Systems  Constitutional:  Positive for chills and malaise/fatigue.  HENT:  Positive for ear pain (Left) and sore throat.   Gastrointestinal:  Negative for abdominal pain, constipation and diarrhea.  Genitourinary:  Negative for frequency.       (+) Almond smelling urine  Neurological:  Positive for headaches.      Objective:    Physical Exam Constitutional:      General: He is not in acute distress.    Appearance: Normal appearance. He is not ill-appearing.  HENT:     Head: Normocephalic and atraumatic.     Right Ear: Tympanic membrane, ear canal and external ear normal.     Left Ear: Tympanic membrane, ear canal and external ear normal.     Mouth/Throat:     Pharynx: No oropharyngeal  exudate.  Eyes:     Extraocular Movements: Extraocular movements intact.     Pupils: Pupils are equal, round, and reactive to light.  Cardiovascular:     Rate and Rhythm: Normal rate and regular rhythm.     Heart sounds: Normal heart sounds. No murmur heard.   No gallop.  Pulmonary:     Effort: Pulmonary effort is normal.  No respiratory distress.     Breath sounds: Normal breath sounds. No wheezing or rales.  Skin:    General: Skin is warm and dry.  Neurological:     Mental Status: He is alert and oriented to person, place, and time.  Psychiatric:        Mood and Affect: Mood normal.        Behavior: Behavior normal.        Judgment: Judgment normal.    BP 128/66 (BP Location: Right Arm, Patient Position: Sitting, Cuff Size: Small)   Pulse 62   Temp 98.6 F (37 C) (Oral)   Resp 16   Wt 198 lb (89.8 kg)   SpO2 99%   BMI 31.01 kg/m  Wt Readings from Last 3 Encounters:  12/24/21 198 lb (89.8 kg)  12/06/21 197 lb (89.4 kg)  11/20/21 197 lb 6.4 oz (89.5 kg)       Assessment & Plan:   Problem List Items Addressed This Visit       Unprioritized   Prostatitis    He is maintained on cipro which is being managed by urology and he has a follow up appointment next week with them. In the meantime, will check CBC, Urine culture, UA.        Relevant Orders   Urine Culture   Urinalysis, Routine w reflex microscopic   Fatigue - Primary    Etiology unclear- differential includes UTI/Prostatitis, viral illness, recurrent biliary stricture.  He is advised to go to the ER if he develops fever or abdominal pain.        Relevant Orders   Comp Met (CMET)   TSH   CBC with Differential/Platelet   Biliary stricture    S/p stent removal. Check follow up LFTs.           No orders of the defined types were placed in this encounter.   I, Nance Pear, NP, personally preformed the services described in this documentation.  All medical record entries made by the scribe  were at my direction and in my presence.  I have reviewed the chart and discharge instructions (if applicable) and agree that the record reflects my personal performance and is accurate and complete. 12/24/2021   I,Amber Collins,acting as a scribe for Nance Pear, NP.,have documented all relevant documentation on the behalf of Nance Pear, NP,as directed by  Nance Pear, NP while in the presence of Nance Pear, NP.    Nance Pear, NP

## 2021-12-24 NOTE — Assessment & Plan Note (Signed)
S/p stent removal. Check follow up LFTs.

## 2021-12-25 DIAGNOSIS — R8271 Bacteriuria: Secondary | ICD-10-CM | POA: Diagnosis not present

## 2021-12-25 LAB — CBC WITH DIFFERENTIAL/PLATELET
Basophils Absolute: 0.1 10*3/uL (ref 0.0–0.1)
Basophils Relative: 1.1 % (ref 0.0–3.0)
Eosinophils Absolute: 0.3 10*3/uL (ref 0.0–0.7)
Eosinophils Relative: 4.4 % (ref 0.0–5.0)
HCT: 42.9 % (ref 39.0–52.0)
Hemoglobin: 14.4 g/dL (ref 13.0–17.0)
Lymphocytes Relative: 24.9 % (ref 12.0–46.0)
Lymphs Abs: 1.6 10*3/uL (ref 0.7–4.0)
MCHC: 33.5 g/dL (ref 30.0–36.0)
MCV: 89.6 fl (ref 78.0–100.0)
Monocytes Absolute: 0.3 10*3/uL (ref 0.1–1.0)
Monocytes Relative: 4.2 % (ref 3.0–12.0)
Neutro Abs: 4.3 10*3/uL (ref 1.4–7.7)
Neutrophils Relative %: 65.4 % (ref 43.0–77.0)
Platelets: 268 10*3/uL (ref 150.0–400.0)
RBC: 4.79 Mil/uL (ref 4.22–5.81)
RDW: 13.5 % (ref 11.5–15.5)
WBC: 6.5 10*3/uL (ref 4.0–10.5)

## 2021-12-25 LAB — COMPREHENSIVE METABOLIC PANEL
ALT: 21 U/L (ref 0–53)
AST: 15 U/L (ref 0–37)
Albumin: 4.2 g/dL (ref 3.5–5.2)
Alkaline Phosphatase: 83 U/L (ref 39–117)
BUN: 19 mg/dL (ref 6–23)
CO2: 27 mEq/L (ref 19–32)
Calcium: 9.2 mg/dL (ref 8.4–10.5)
Chloride: 105 mEq/L (ref 96–112)
Creatinine, Ser: 0.94 mg/dL (ref 0.40–1.50)
GFR: 80.34 mL/min (ref >60.00)
Glucose, Bld: 129 mg/dL — ABNORMAL HIGH (ref 70–99)
Potassium: 4.2 mEq/L (ref 3.5–5.1)
Sodium: 140 mEq/L (ref 135–145)
Total Bilirubin: 0.6 mg/dL (ref 0.2–1.2)
Total Protein: 6.4 g/dL (ref 6.0–8.3)

## 2021-12-25 LAB — URINE CULTURE
MICRO NUMBER:: 13458042
Result:: NO GROWTH
SPECIMEN QUALITY:: ADEQUATE

## 2021-12-25 LAB — TSH: TSH: 2.06 u[IU]/mL (ref 0.35–5.50)

## 2022-01-01 DIAGNOSIS — K831 Obstruction of bile duct: Secondary | ICD-10-CM | POA: Diagnosis not present

## 2022-01-01 DIAGNOSIS — K9189 Other postprocedural complications and disorders of digestive system: Secondary | ICD-10-CM | POA: Diagnosis not present

## 2022-01-01 DIAGNOSIS — R82998 Other abnormal findings in urine: Secondary | ICD-10-CM | POA: Diagnosis not present

## 2022-01-01 DIAGNOSIS — N411 Chronic prostatitis: Secondary | ICD-10-CM | POA: Diagnosis not present

## 2022-02-11 ENCOUNTER — Encounter: Payer: Self-pay | Admitting: Family

## 2022-02-11 DIAGNOSIS — B965 Pseudomonas (aeruginosa) (mallei) (pseudomallei) as the cause of diseases classified elsewhere: Secondary | ICD-10-CM | POA: Diagnosis not present

## 2022-02-11 DIAGNOSIS — Z90411 Acquired partial absence of pancreas: Secondary | ICD-10-CM | POA: Diagnosis not present

## 2022-02-11 DIAGNOSIS — C259 Malignant neoplasm of pancreas, unspecified: Secondary | ICD-10-CM | POA: Diagnosis not present

## 2022-02-11 DIAGNOSIS — Z8619 Personal history of other infectious and parasitic diseases: Secondary | ICD-10-CM | POA: Diagnosis not present

## 2022-02-11 DIAGNOSIS — N401 Enlarged prostate with lower urinary tract symptoms: Secondary | ICD-10-CM | POA: Diagnosis not present

## 2022-02-11 DIAGNOSIS — Z808 Family history of malignant neoplasm of other organs or systems: Secondary | ICD-10-CM | POA: Diagnosis not present

## 2022-02-11 DIAGNOSIS — N411 Chronic prostatitis: Secondary | ICD-10-CM | POA: Diagnosis not present

## 2022-02-11 DIAGNOSIS — C6290 Malignant neoplasm of unspecified testis, unspecified whether descended or undescended: Secondary | ICD-10-CM | POA: Diagnosis not present

## 2022-02-11 DIAGNOSIS — C439 Malignant melanoma of skin, unspecified: Secondary | ICD-10-CM | POA: Diagnosis not present

## 2022-02-11 DIAGNOSIS — Z803 Family history of malignant neoplasm of breast: Secondary | ICD-10-CM | POA: Diagnosis not present

## 2022-02-11 DIAGNOSIS — E291 Testicular hypofunction: Secondary | ICD-10-CM | POA: Diagnosis not present

## 2022-02-11 DIAGNOSIS — Z9889 Other specified postprocedural states: Secondary | ICD-10-CM | POA: Diagnosis not present

## 2022-02-11 DIAGNOSIS — Z9079 Acquired absence of other genital organ(s): Secondary | ICD-10-CM | POA: Diagnosis not present

## 2022-02-18 DIAGNOSIS — C259 Malignant neoplasm of pancreas, unspecified: Secondary | ICD-10-CM | POA: Diagnosis not present

## 2022-02-18 DIAGNOSIS — Z9889 Other specified postprocedural states: Secondary | ICD-10-CM | POA: Diagnosis not present

## 2022-02-18 DIAGNOSIS — D490 Neoplasm of unspecified behavior of digestive system: Secondary | ICD-10-CM | POA: Diagnosis not present

## 2022-02-18 DIAGNOSIS — Z8507 Personal history of malignant neoplasm of pancreas: Secondary | ICD-10-CM | POA: Diagnosis not present

## 2022-02-18 DIAGNOSIS — R7309 Other abnormal glucose: Secondary | ICD-10-CM | POA: Diagnosis not present

## 2022-02-18 DIAGNOSIS — C250 Malignant neoplasm of head of pancreas: Secondary | ICD-10-CM | POA: Diagnosis not present

## 2022-03-04 ENCOUNTER — Ambulatory Visit (INDEPENDENT_AMBULATORY_CARE_PROVIDER_SITE_OTHER): Payer: Medicare HMO | Admitting: Family

## 2022-03-04 VITALS — BP 136/73 | HR 61 | Temp 98.5°F | Resp 16 | Wt 198.0 lb

## 2022-03-04 DIAGNOSIS — R1013 Epigastric pain: Secondary | ICD-10-CM | POA: Insufficient documentation

## 2022-03-04 DIAGNOSIS — C251 Malignant neoplasm of body of pancreas: Secondary | ICD-10-CM | POA: Diagnosis not present

## 2022-03-04 NOTE — Assessment & Plan Note (Addendum)
Notes last Ca 19-9 was higher than recent values and is requesting a repeat today.

## 2022-03-04 NOTE — Assessment & Plan Note (Signed)
Recent CT abd was stable 2 weeks ago at [facility].  Will obtain labs as below for further evaluation. Biliary stricture and pancreatitis remain in the differential. Thankfully symptoms are clinically resolved.

## 2022-03-04 NOTE — Progress Notes (Signed)
Subjective:   By signing my name below, I, Carylon Perches, attest that this documentation has been prepared under the direction and in the presence of Stephen Chimera, NP 03/04/2022     Patient ID: Stephen Evans, male    DOB: 10-05-1945, 76 y.o.   MRN: [id number]  Chief Complaint  Patient presents with   Abdominal Pain    Complains of "severe epigastric pain on 02/28/22 that lasted for about an hour"    HPI Patient is in today for an office visit   Abdominal Pain: He complains of immense abdominal pain that occurred on 02/27/2022. The pain radiated across his umbilical region. He had a scan two weeks ago and reports that his umbilical region was not constricted. On 02/26/2022, he had some constipation and therefore took 2 fiber supplements in the day and 3 supplements later on. He notes that two weeks ago, he had blood work done and noticed that his CA 19-9 levels were elevating. He therefore wants to get additional blood work done. He denies of any nausea or vomiting.  Urology: He is schedule to see Dr. Amalia Hailey on 03/19/2022  Health Maintenance Due  Topic Date Due   Zoster Vaccines- Shingrix (1 of 2) Never done   INFLUENZA VACCINE  02/25/2022    Past Medical History:  Diagnosis Date   Goals of care, counseling/discussion 05/28/2020   Melanoma ([place]) x 2   Melanoma ([place])    Melanoma ([place])    Pancreas cancer ([place]) 05/28/2020   Pancreatitis 06/2018   thought to be due to alcohol   Testicular cancer [facility]) 2011   testicular--radiation   Testicular tumor     Past Surgical History:  Procedure Laterality Date   BIOPSY  01/18/2020   Procedure: BIOPSY;  Surgeon: Irving Copas., MD;  Location: [facility];  Service: Gastroenterology;;   COLONOSCOPY  11/01/2003   ESOPHAGOGASTRODUODENOSCOPY (EGD) WITH PROPOFOL N/A 01/18/2020   Procedure: ESOPHAGOGASTRODUODENOSCOPY (EGD) WITH PROPOFOL;  Surgeon: Irving Copas., MD;  Location: [facility];  Service: Gastroenterology;   Laterality: N/A;   EUS N/A 01/18/2020   Procedure: UPPER ENDOSCOPIC ULTRASOUND (EUS) LINEAR;  Surgeon: Irving Copas., MD;  Location: [facility];  Service: Gastroenterology;  Laterality: N/A;   FINE NEEDLE ASPIRATION N/A 01/18/2020   Procedure: FINE NEEDLE ASPIRATION (FNA) LINEAR;  Surgeon: Irving Copas., MD;  Location: [facility];  Service: Gastroenterology;  Laterality: N/A;   LYMPH GLAND EXCISION Right 1979   axillary, benign   MELANOMA EXCISION Right 03/2014   elbow   ORCHIECTOMY Right 2001   followed by radiation   TONSILLECTOMY  1959   wide excision of melanoma on back  1979    Family History  Problem Relation Age of Onset   Epilepsy Mother    Esophageal cancer Father 30       smoking/drinking hx   Breast cancer Sister 46   Alcohol abuse Brother    Cancer Maternal Uncle        unknown type; d. late 28s   Testicular cancer Maternal Grandfather        unknown age   Colon cancer Neg Hx    Colon polyps Neg Hx    Rectal cancer Neg Hx    Stomach cancer Neg Hx    Pancreatic cancer Neg Hx     Social History   Socioeconomic History   Marital status: Married    Spouse name: Not on file   Number of children: 2   Years of education:  Not on file   Highest education level: Not on file  Occupational History   Occupation: retired    Comment: Arts development officer  Tobacco Use   Smoking status: Former    Years: 30.00    Types: Cigarettes    Quit date: 09/06/1997    Years since quitting: 24.5   Smokeless tobacco: Never  Vaping Use   Vaping Use: Never used  Substance and Sexual Activity   Alcohol use: Yes    Comment: 1 every 2 weeks   Drug use: No   Sexual activity: Not on file  Other Topics Concern   Not on file  Social History Narrative   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[employer]   Married- second marriage x 63 yrs   90 yr old son- lives in [place], [place] manages a private hedge fund   42 yr old  daughter- lives in [place], [place]   Enjoys golf   Social Determinants of Health   Financial Resource Strain: Not on file  Food Insecurity: Not on file  Transportation Needs: Not on file  Physical Activity: Not on file  Stress: Not on file  Social Connections: Not on file  Intimate Partner Violence: Not on file    Outpatient Medications Prior to Visit  Medication Sig Dispense Refill   Multiple Vitamin (MULTIVITAMIN) tablet Take 1 tablet by mouth daily.     Testosterone 100 MG PLLT Apply 1 application topically daily.      valACYclovir (VALTREX) 1000 MG tablet Take 1,000 mg by mouth daily as needed (cold sores).      ciprofloxacin (CIPRO) 500 MG tablet Take 500 mg by mouth 2 (two) times daily.     No facility-administered medications prior to visit.    Allergies  Allergen Reactions   Prednisone Other (See Comments)    pancreatitis     ROS    See HPI  Objective:    Physical Exam Constitutional:      General: He is not in acute distress.    Appearance: Normal appearance. He is not ill-appearing.  HENT:     Head: Normocephalic and atraumatic.     Right Ear: External ear normal.     Left Ear: External ear normal.  Eyes:     Extraocular Movements: Extraocular movements intact.     Pupils: Pupils are equal, round, and reactive to light.  Cardiovascular:     Rate and Rhythm: Normal rate and regular rhythm.     Heart sounds: Normal heart sounds. No murmur heard.    No gallop.  Pulmonary:     Effort: Pulmonary effort is normal. No respiratory distress.     Breath sounds: Normal breath sounds. No wheezing or rales.  Abdominal:     General: Bowel sounds are normal. There is no distension.     Palpations: Abdomen is soft.     Tenderness: There is no abdominal tenderness. There is no guarding.  Skin:    General: Skin is warm and dry.  Neurological:     Mental Status: He is alert and oriented to person, place, and time.  Psychiatric:        Mood and Affect:  Mood normal.        Behavior: Behavior normal.        Judgment: Judgment normal.     BP 136/73 (BP Location: Right Arm, Patient Position: Sitting, Cuff Size: Small)   Pulse 61   Temp 98.5 F (36.9 C) (Oral)  Resp 16   Wt 198 lb (89.8 kg)   SpO2 100%   BMI 31.01 kg/m  Wt Readings from Last 3 Encounters:  03/04/22 198 lb (89.8 kg)  12/24/21 198 lb (89.8 kg)  12/06/21 197 lb (89.4 kg)       Assessment & Plan:   Problem List Items Addressed This Visit       Unprioritized   Pancreas cancer ([place]) (Chronic)    Notes last Ca 19-9 was higher than recent values and is requesting a repeat today.       Relevant Orders   Cancer antigen 19-9   Epigastric pain - Primary    Recent CT abd was stable 2 weeks ago at [facility].  Will obtain labs as below for further evaluation. Biliary stricture and pancreatitis remain in the differential. Thankfully symptoms are clinically resolved.       Relevant Orders   CBC with Differential/Platelet   Comp Met (CMET)   Lipase   No orders of the defined types were placed in this encounter.   I, Nance Pear, NP, personally preformed the services described in this documentation.  All medical record entries made by the scribe were at my direction and in my presence.  I have reviewed the chart and discharge instructions (if applicable) and agree that the record reflects my personal performance and is accurate and complete. 03/04/2022   I,Amber Collins,acting as a scribe for Nance Pear, NP.,have documented all relevant documentation on the behalf of Nance Pear, NP,as directed by  Nance Pear, NP while in the presence of Nance Pear, NP.    Nance Pear, NP

## 2022-03-05 ENCOUNTER — Encounter: Payer: Self-pay | Admitting: Hematology & Oncology

## 2022-03-05 LAB — CBC WITH DIFFERENTIAL/PLATELET
Basophils Absolute: 0 10*3/uL (ref 0.0–0.1)
Basophils Relative: 0.3 % (ref 0.0–3.0)
Eosinophils Absolute: 0.3 10*3/uL (ref 0.0–0.7)
Eosinophils Relative: 4.7 % (ref 0.0–5.0)
HCT: 43.5 % (ref 39.0–52.0)
Hemoglobin: 14.5 g/dL (ref 13.0–17.0)
Lymphocytes Relative: 25.7 % (ref 12.0–46.0)
Lymphs Abs: 1.7 10*3/uL (ref 0.7–4.0)
MCHC: 33.5 g/dL (ref 30.0–36.0)
MCV: 90.7 fl (ref 78.0–100.0)
Monocytes Absolute: 0.5 10*3/uL (ref 0.1–1.0)
Monocytes Relative: 7.5 % (ref 3.0–12.0)
Neutro Abs: 4 10*3/uL (ref 1.4–7.7)
Neutrophils Relative %: 61.8 % (ref 43.0–77.0)
Platelets: 261 10*3/uL (ref 150.0–400.0)
RBC: 4.79 Mil/uL (ref 4.22–5.81)
RDW: 13.9 % (ref 11.5–15.5)
WBC: 6.5 10*3/uL (ref 4.0–10.5)

## 2022-03-05 LAB — COMPREHENSIVE METABOLIC PANEL
ALT: 57 U/L — ABNORMAL HIGH (ref 0–53)
AST: 16 U/L (ref 0–37)
Albumin: 4.4 g/dL (ref 3.5–5.2)
Alkaline Phosphatase: 132 U/L — ABNORMAL HIGH (ref 39–117)
BUN: 16 mg/dL (ref 6–23)
CO2: 30 mEq/L (ref 19–32)
Calcium: 9.3 mg/dL (ref 8.4–10.5)
Chloride: 107 mEq/L (ref 96–112)
Creatinine, Ser: 0.89 mg/dL (ref 0.40–1.50)
GFR: 84.82 mL/min (ref >60.00)
Glucose, Bld: 106 mg/dL — ABNORMAL HIGH (ref 70–99)
Potassium: 4.6 mEq/L (ref 3.5–5.1)
Sodium: 139 mEq/L (ref 135–145)
Total Bilirubin: 0.5 mg/dL (ref 0.2–1.2)
Total Protein: 6.9 g/dL (ref 6.0–8.3)

## 2022-03-05 LAB — LIPASE: Lipase: 13 U/L (ref 11.0–59.0)

## 2022-03-05 LAB — CANCER ANTIGEN 19-9: CA 19-9: 59 U/mL — ABNORMAL HIGH (ref <34)

## 2022-03-06 ENCOUNTER — Telehealth: Payer: Self-pay | Admitting: Family

## 2022-03-06 DIAGNOSIS — R7989 Other specified abnormal findings of blood chemistry: Secondary | ICD-10-CM

## 2022-03-06 DIAGNOSIS — R748 Abnormal levels of other serum enzymes: Secondary | ICD-10-CM

## 2022-03-06 NOTE — Telephone Encounter (Signed)
See mychart.  

## 2022-03-17 ENCOUNTER — Other Ambulatory Visit (INDEPENDENT_AMBULATORY_CARE_PROVIDER_SITE_OTHER): Payer: Medicare HMO

## 2022-03-17 DIAGNOSIS — R7989 Other specified abnormal findings of blood chemistry: Secondary | ICD-10-CM | POA: Diagnosis not present

## 2022-03-17 DIAGNOSIS — R748 Abnormal levels of other serum enzymes: Secondary | ICD-10-CM | POA: Diagnosis not present

## 2022-03-18 LAB — HEPATIC FUNCTION PANEL
ALT: 14 U/L (ref 0–53)
AST: 12 U/L (ref 0–37)
Albumin: 4.4 g/dL (ref 3.5–5.2)
Alkaline Phosphatase: 90 U/L (ref 39–117)
Bilirubin, Direct: 0.1 mg/dL (ref 0.0–0.3)
Total Bilirubin: 0.6 mg/dL (ref 0.2–1.2)
Total Protein: 6.8 g/dL (ref 6.0–8.3)

## 2022-03-19 DIAGNOSIS — N411 Chronic prostatitis: Secondary | ICD-10-CM | POA: Diagnosis not present

## 2022-03-19 DIAGNOSIS — R399 Unspecified symptoms and signs involving the genitourinary system: Secondary | ICD-10-CM | POA: Diagnosis not present

## 2022-03-21 LAB — SPECIMEN STATUS REPORT

## 2022-03-21 LAB — ALKALINE PHOSPHATASE, ISOENZYMES
Alkaline Phosphatase: 104 IU/L (ref 44–121)
BONE FRACTION: 30 % (ref 12–68)
INTESTINAL FRAC.: 0 % (ref 0–18)
LIVER FRACTION: 70 % (ref 13–88)

## 2022-03-26 ENCOUNTER — Ambulatory Visit: Payer: Medicare HMO | Admitting: Family

## 2022-04-10 ENCOUNTER — Encounter: Payer: Self-pay | Admitting: Hematology & Oncology

## 2022-04-10 ENCOUNTER — Telehealth (INDEPENDENT_AMBULATORY_CARE_PROVIDER_SITE_OTHER): Payer: Medicare HMO | Admitting: Family Medicine

## 2022-04-10 ENCOUNTER — Encounter: Payer: Self-pay | Admitting: Family

## 2022-04-10 ENCOUNTER — Encounter: Payer: Self-pay | Admitting: Family Medicine

## 2022-04-10 VITALS — Ht 67.0 in | Wt 198.0 lb

## 2022-04-10 DIAGNOSIS — U071 COVID-19: Secondary | ICD-10-CM | POA: Diagnosis not present

## 2022-04-10 MED ORDER — BENZONATATE 100 MG PO CAPS: ORAL_CAPSULE | ORAL | 0 refills | Status: DC

## 2022-04-10 MED ORDER — NIRMATRELVIR/RITONAVIR (PAXLOVID)TABLET: 3.0000 | ORAL_TABLET | Freq: Two times a day (BID) | ORAL | 0 refills | Status: AC

## 2022-04-10 NOTE — Patient Instructions (Addendum)
HOME CARE TIPS:  -COVID19 testing information: [email or web address]  Most pharmacies also offer testing and home test kits. If the Covid19 test is positive and you desire antiviral treatment, please contact a [facility] or schedule a follow up virtual visit through your primary care office or through the [facility].  Other test to treat options: [email or web address].is?[email or web address]  -I sent the medication(s) we discussed to your pharmacy: Meds ordered this encounter  Medications   nirmatrelvir/ritonavir EUA (PAXLOVID) 20 x 150 MG & 10 x '100MG'$  TABS    Sig: Take 3 tablets by mouth 2 (two) times daily for 5 days. (Take nirmatrelvir 150 mg two tablets twice daily for 5 days and ritonavir 100 mg one tablet twice daily for 5 days) Patient GFR is > 60    Dispense:  30 tablet    Refill:  0   benzonatate (TESSALON PERLES) 100 MG capsule    Sig: 1-2 up to twice daily as needed for cough    Dispense:  30 capsule    Refill:  0     -I sent in the [facility] treatment or referral you requested per our discussion. Please see the information provided below and discuss further with the pharmacist/treatment team.  -If taking Paxlovid, please review all medications, supplement and over the counter drugs with your pharmacist and ask them to check for any interactions. Please make the following changes to your regular medications while taking Paxlovid: *change your flomax (tamsulosin) dose to 1 pill every other day and monitor blood pressure or for signs of low blood pressure until 3 days after finishing paxlovid *stop supplements while taking paxlovid   -there is a chance of rebound illness with covid after improving. This can happen whether or not you take an antiviral treatment. If you become sick again with covid after getting better, please schedule a follow up virtual visit and isolate again.  -nasal saline sinus rinses twice daily  -stay  hydrated, drink plenty of fluids and eat small healthy meals - avoid dairy   -follow up with your doctor in 2-3 days unless improving and feeling better  -stay home while sick, except to seek medical care. If you have COVID19, you will likely be contagious for 7-10 days. Flu or Influenza is likely contagious for about 7 days. Other respiratory viral infections remain contagious for 5-10+ days depending on the virus and many other factors. Wear a good mask that fits snugly (such as N95 or KN95) if around others to reduce the risk of transmission.  It was nice to meet you today, and I really hope you are feeling better soon. I help [facility] out with telemedicine visits on Tuesdays and Thursdays and am happy to help if you need a follow up virtual visit on those days. Otherwise, if you have any concerns or questions following this visit please schedule a follow up visit with your Primary Care doctor or seek care at a local urgent care clinic to avoid delays in care.    Seek in person care or schedule a follow up video visit promptly if your symptoms worsen, new concerns arise or you are not improving with treatment. Call 911 and/or seek emergency care if your symptoms are severe or life threatening.   See the following link for the most recent information regarding Paxlovid:  www.paxlovid.com   Nirmatrelvir; Ritonavir Tablets What is this medication? NIRMATRELVIR; RITONAVIR (NIR ma TREL vir; ri TOE na veer) treats mild to moderate COVID-19. It may help people who are at high  risk of developing severe illness. This medication works by limiting the spread of the virus in your body. The FDA has allowed the emergency use of this medication. This medicine may be used for other purposes; ask your health care provider or pharmacist if you have questions. COMMON BRAND NAME(S): PAXLOVID What should I tell my care team before I take this medication? They need to know if you have any of these  conditions: Any allergies Any serious illness Kidney disease Liver disease An unusual or allergic reaction to nirmatrelvir, ritonavir, other medications, foods, dyes, or preservatives Pregnant or trying to get pregnant Breast-feeding How should I use this medication? This product contains 2 different medications that are packaged together. For the standard dose, take 2 pink tablets of nirmatrelvir with 1 white tablet of ritonavir (3 tablets total) by mouth with water twice daily. Talk to your care team if you have kidney disease. You may need a different dose. Swallow the tablets whole. You can take it with or without food. If it upsets your stomach, take it with food. Take all of this medication unless your care team tells you to stop it early. Keep taking it even if you think you are better. Talk to your care team about the use of this medication in children. While it may be prescribed for children as young as 12 years for selected conditions, precautions do apply. Overdosage: If you think you have taken too much of this medicine contact a poison control center or emergency room at once. NOTE: This medicine is only for you. Do not share this medicine with others. What if I miss a dose? If you miss a dose, take it as soon as you can unless it is more than 8 hours late. If it is more than 8 hours late, skip the missed dose. Take the next dose at the normal time. Do not take extra or 2 doses at the same time to make up for the missed dose. What may interact with this medication? Do not take this medication with any of the following medications: Alfuzosin Certain medications for anxiety or sleep like midazolam, triazolam Certain medications for cancer like apalutamide, enzalutamide Certain medications for cholesterol like lovastatin, simvastatin Certain medications for irregular heart beat like amiodarone, dronedarone, flecainide, propafenone, quinidine Certain medications for pain like  meperidine, piroxicam Certain medications for psychotic disorders like clozapine, lurasidone, pimozide Certain medications for seizures like carbamazepine, phenobarbital, phenytoin Colchicine Eletriptan Eplerenone Ergot alkaloids like dihydroergotamine, ergonovine, ergotamine, methylergonovine Finerenone Flibanserin Ivabradine Lomitapide Naloxegol Ranolazine Rifampin Sildenafil Silodosin St. John's Wort Tolvaptan Ubrogepant Voclosporin This medication may also interact with the following medications: Bedaquiline Birth control pills Bosentan Certain antibiotics like erythromycin or clarithromycin Certain medications for blood pressure like amlodipine, diltiazem, felodipine, nicardipine, nifedipine Certain medications for cancer like abemaciclib, ceritinib, dasatinib, encorafenib, ibrutinib, ivosidenib, neratinib, nilotinib, venetoclax, vinblastine, vincristine Certain medications for cholesterol like atorvastatin, rosuvastatin Certain medications for depression like bupropion, trazodone Certain medications for fungal infections like isavuconazonium, itraconazole, ketoconazole, voriconazole Certain medications for hepatitis C like elbasvir; grazoprevir, dasabuvir; ombitasvir; paritaprevir; ritonavir, glecaprevir; pibrentasvir, sofosbuvir; velpatasvir; voxilaprevir Certain medications for HIV or AIDS Certain medications for irregular heartbeat like lidocaine Certain medications that treat or prevent blood clots like rivaroxaban, warfarin Digoxin Fentanyl Medications that lower your chance of fighting infection like cyclosporine, sirolimus, tacrolimus Methadone Quetiapine Rifabutin Salmeterol Steroid medications like betamethasone, budesonide, ciclesonide, dexamethasone, fluticasone, methylprednisone, mometasone, triamcinolone This list may not describe all possible interactions. Give your health care provider a list of all  the medicines, herbs, non-prescription drugs, or  dietary supplements you use. Also tell them if you smoke, drink alcohol, or use illegal drugs. Some items may interact with your medicine. What should I watch for while using this medication? Your condition will be monitored carefully while you are receiving this medication. Visit your care team for regular checkups. Tell your care team if your symptoms do not start to get better or if they get worse. If you have untreated HIV infection, this medication may lead to some HIV medications not working as well in the future. Birth control may not work properly while you are taking this medication. Talk to your care team about using an extra method of birth control. What side effects may I notice from receiving this medication? Side effects that you should report to your care team as soon as possible: Allergic reactions--skin rash, itching, hives, swelling of the face, lips, tongue, or throat Liver injury--right upper belly pain, loss of appetite, nausea, light-colored stool, dark yellow or brown urine, yellowing skin or eyes, unusual weakness or fatigue Redness, blistering, peeling, or loosening of the skin, including inside the mouth Side effects that usually do not require medical attention (report these to your care team if they continue or are bothersome): Change in taste Diarrhea General discomfort and fatigue Increase in blood pressure Muscle pain Nausea Stomach pain This list may not describe all possible side effects. Call your doctor for medical advice about side effects. You may report side effects to FDA at 1-800-FDA-1088. Where should I keep my medication? Keep out of the reach of children and pets. Store at room temperature between 20 and 25 degrees C (68 and 77 degrees F). Get rid of any unused medication after the expiration date. To get rid of medications that are no longer needed or have expired: Take the medication to a medication take-back program. Check with your pharmacy or law  enforcement to find a location. If you cannot return the medication, check the label or package insert to see if the medication should be thrown out in the garbage or flushed down the toilet. If you are not sure, ask your care team. If it is safe to put it in the trash, take the medication out of the container. Mix the medication with cat litter, dirt, coffee grounds, or other unwanted substance. Seal the mixture in a bag or container. Put it in the trash. NOTE: This sheet is a summary. It may not cover all possible information. If you have questions about this medicine, talk to your doctor, pharmacist, or health care provider.  2022 Elsevier/Gold Standard (2021-04-15 00:00:00)

## 2022-04-10 NOTE — Progress Notes (Signed)
Virtual Visit via Video Note  I connected with Vang  on 04/10/22 at 12:20 PM EDT by a video enabled telemedicine application and verified that I am speaking with the correct person using two identifiers.  Location patient: [place] Location provider:work or home office Persons participating in the virtual visit: patient, provider  I discussed the limitations and requested verbal permission for telemedicine visit. The patient expressed understanding and agreed to proceed.   HPI:  Acute telemedicine visit for Covid19: -Onset: 2 days ago, tested positive for covid twice today -Symptoms include: cough, headache, nasal congestion, poor sleep, diarrhea -Denies:CP, SOB, fever, vomiting -drinking fluids -Has tried:tylenol - helped headache -Pertinent past medical history: see below, has not had covid, GFR 84 1 month ago -Pertinent medication allergies: Allergies  Allergen Reactions   Prednisone Other (See Comments)    pancreatitis   -COVID-19 vaccine status: had 2 doses and 3 boosters Immunization History  Administered Date(s) Administered   Fluad Quad(high Dose 65+) 04/13/2019, 05/08/2021   Influenza, High Dose Seasonal PF 08/17/2015, 08/28/2016, 05/26/2017   Influenza-Unspecified 05/17/2020   PFIZER Comirnaty(Gray Top)Covid-19 Tri-Sucrose Vaccine 09/03/2020   PFIZER(Purple Top)SARS-COV-2 Vaccination 08/17/2019, 09/05/2019, 03/30/2020   Pfizer Covid-19 Vaccine Bivalent Booster 58yr & up 07/11/2021   Pneumococcal Conjugate-13 08/17/2015   Pneumococcal Polysaccharide-23 08/15/2020   Td 07/28/2008   Tdap 03/11/2019     ROS: See pertinent positives and negatives per HPI.  Past Medical History:  Diagnosis Date   Goals of care, counseling/discussion 05/28/2020   [place]   [place]    [place]    Pancreas cancer ([place] 05/28/2020   Pancreatitis 06/2018   thought to be due to alcohol   Testicular [facility] 2011   testicular--radiation   Testicular tumor      Past Surgical History:  Procedure Laterality Date   BIOPSY  01/18/2020   Procedure: BIOPSY;  Surgeon: MIrving Copas, MD;  [facility];  Service: Gastroenterology;;   COLONOSCOPY  11/01/2003   ESOPHAGOGASTRODUODENOSCOPY (EGD) WITH PROPOFOL N/A 01/18/2020   Procedure: ESOPHAGOGASTRODUODENOSCOPY (EGD) WITH PROPOFOL;  Surgeon: MIrving Copas, MD;  [facility];  Service: Gastroenterology;  Laterality: N/A;   EUS N/A 01/18/2020   Procedure: UPPER ENDOSCOPIC ULTRASOUND (EUS) LINEAR;  Surgeon: MIrving Copas, MD;  [facility];  Service: Gastroenterology;  Laterality: N/A;   FINE NEEDLE ASPIRATION N/A 01/18/2020   Procedure: FINE NEEDLE ASPIRATION (FNA) LINEAR;  Surgeon: MIrving Copas, MD;  Location: [facility];  Service: Gastroenterology;  Laterality: N/A;   LYMPH GLAND EXCISION Right 1979   axillary, benign   MELANOMA EXCISION Right 03/2014   elbow   ORCHIECTOMY Right 2001   followed by radiation   TONSILLECTOMY  1959   wide excision of melanoma on back  1979     Current Outpatient Medications:    benzonatate (TESSALON PERLES) 100 MG capsule, 1-2 up to twice daily as needed for cough, Disp: 30 capsule, Rfl: 0   Multiple Vitamin (MULTIVITAMIN) tablet, Take 1 tablet by mouth daily., Disp: , Rfl:    nirmatrelvir/ritonavir EUA (PAXLOVID) 20 x 150 MG & 10 x '100MG'$  TABS, Take 3 tablets by mouth 2 (two) times daily for 5 days. (Take nirmatrelvir 150 mg two tablets twice daily for 5 days and ritonavir 100 mg one tablet twice daily for 5 days) Patient GFR is > 60, Disp: 30 tablet, Rfl: 0   tamsulosin (FLOMAX) 0.4 MG CAPS capsule, Take 1 capsule by mouth daily., Disp: , Rfl:  Testosterone 100 MG PLLT, Apply 1 application topically daily. , Disp: , Rfl:    valACYclovir (VALTREX) 1000 MG tablet, Take 1,000 mg by mouth daily as needed (cold sores). , Disp: , Rfl:   EXAM:  VITALS per patient if applicable:  GENERAL: alert,  oriented, appears well and in no acute distress  HEENT: atraumatic, conjunttiva clear, no obvious abnormalities on inspection of external nose and ears  NECK: normal movements of the head and neck  LUNGS: on inspection no signs of respiratory distress, breathing rate appears normal, no obvious gross SOB, gasping or wheezing  CV: no obvious cyanosis  MS: moves all visible extremities without noticeable abnormality  PSYCH/NEURO: pleasant and cooperative, no obvious depression or anxiety, speech and thought processing grossly intact  ASSESSMENT AND PLAN:  Discussed the following assessment and plan:  COVID-19   Discussed treatment options, side effect and risk of drug interactions, ideal treatment window, potential complications, isolation and precautions for COVID-19.  Discussed possibility of rebound with or without antivirals. Checked for/reviewed last GFR - listed in HPI if available.  After lengthy discussion, the patient opted for treatment with Paxlovid due to being higher risk for complications of covid or severe disease and other factors. Discussed EUA status of this drug and the fact that there is preliminary limited knowledge of risks/interactions/side effects per EUA document vs possible benefits and precautions. He is aware of potential interaction with Tamsulosin and plans to either hold tamsulosin or take every other day and monitor for symptoms of hypotension/and BP. Has home cuff.  This information was shared with patient during the visit and also was provided in patient instructions. Also, advise that patient discuss risks/interactions and use with pharmacist/treatment team as well.  The patient did want a prescription for cough, Tessalon Rx sent.  Other symptomatic care measures summarized in patient instructions.  Advised to seek prompt virtual visit or in person care if worsening, new symptoms arise, or if is not improving with treatment as expected per our conversation of  expected course. Discussed options for follow up care. Did let this patient know that I do telemedicine on Tuesdays and Thursdays for [place] and those are the days I am logged into the system. Advised to schedule follow up visit with PCP, [place] virtual visits or [facility] if any further questions or concerns to avoid delays in care.   I discussed the assessment and treatment plan with the patient. The patient was provided an opportunity to ask questions and all were answered. The patient agreed with the plan and demonstrated an understanding of the instructions.     Lucretia Kern, DO

## 2022-04-11 ENCOUNTER — Inpatient Hospital Stay: Payer: Medicare HMO

## 2022-04-11 ENCOUNTER — Inpatient Hospital Stay: Payer: Medicare HMO | Admitting: Hematology & Oncology

## 2022-04-21 ENCOUNTER — Encounter: Payer: Self-pay | Admitting: Family

## 2022-04-25 ENCOUNTER — Inpatient Hospital Stay: Payer: Medicare HMO | Attending: Hematology & Oncology | Admitting: Hematology & Oncology

## 2022-04-25 ENCOUNTER — Inpatient Hospital Stay: Payer: Medicare HMO

## 2022-04-25 ENCOUNTER — Encounter: Payer: Self-pay | Admitting: Hematology & Oncology

## 2022-04-25 VITALS — BP 134/75 | HR 72 | Temp 97.5°F | Resp 20 | Ht 67.0 in | Wt 201.4 lb

## 2022-04-25 DIAGNOSIS — C251 Malignant neoplasm of body of pancreas: Secondary | ICD-10-CM

## 2022-04-25 LAB — CMP (CANCER CENTER ONLY)
ALT: 12 U/L (ref 0–44)
AST: 10 U/L — ABNORMAL LOW (ref 15–41)
Albumin: 4.4 g/dL (ref 3.5–5.0)
Alkaline Phosphatase: 85 U/L (ref 38–126)
Anion gap: 6 (ref 5–15)
BUN: 17 mg/dL (ref 8–23)
CO2: 30 mmol/L (ref 22–32)
Calcium: 9.6 mg/dL (ref 8.9–10.3)
Chloride: 105 mmol/L (ref 98–111)
Creatinine: 0.92 mg/dL (ref 0.61–1.24)
GFR, Estimated: >60 mL/min (ref >60)
Glucose, Bld: 133 mg/dL — ABNORMAL HIGH (ref 70–99)
Potassium: 4.1 mmol/L (ref 3.5–5.1)
Sodium: 141 mmol/L (ref 135–145)
Total Bilirubin: 0.4 mg/dL (ref 0.3–1.2)
Total Protein: 6.9 g/dL (ref 6.5–8.1)

## 2022-04-25 LAB — URINALYSIS, COMPLETE (UACMP) WITH MICROSCOPIC
Bilirubin Urine: NEGATIVE
Glucose, UA: NEGATIVE mg/dL
Hgb urine dipstick: NEGATIVE
Ketones, ur: NEGATIVE mg/dL
Leukocytes,Ua: NEGATIVE
Nitrite: NEGATIVE
Protein, ur: NEGATIVE mg/dL
Specific Gravity, Urine: 1.025 (ref 1.005–1.030)
pH: 5 (ref 5.0–8.0)

## 2022-04-25 LAB — CBC WITH DIFFERENTIAL (CANCER CENTER ONLY)
Abs Immature Granulocytes: 0.02 10*3/uL (ref 0.00–0.07)
Basophils Absolute: 0.1 10*3/uL (ref 0.0–0.1)
Basophils Relative: 1 %
Eosinophils Absolute: 0.3 10*3/uL (ref 0.0–0.5)
Eosinophils Relative: 4 %
HCT: 43.2 % (ref 39.0–52.0)
Hemoglobin: 14.4 g/dL (ref 13.0–17.0)
Immature Granulocytes: 0 %
Lymphocytes Relative: 27 %
Lymphs Abs: 1.9 10*3/uL (ref 0.7–4.0)
MCH: 30.3 pg (ref 26.0–34.0)
MCHC: 33.3 g/dL (ref 30.0–36.0)
MCV: 90.8 fL (ref 80.0–100.0)
Monocytes Absolute: 0.4 10*3/uL (ref 0.1–1.0)
Monocytes Relative: 6 %
Neutro Abs: 4.5 10*3/uL (ref 1.7–7.7)
Neutrophils Relative %: 62 %
Platelet Count: 277 10*3/uL (ref 150–400)
RBC: 4.76 MIL/uL (ref 4.22–5.81)
RDW: 12.5 % (ref 11.5–15.5)
WBC Count: 7.1 10*3/uL (ref 4.0–10.5)
nRBC: 0 % (ref 0.0–0.2)

## 2022-04-25 LAB — LACTATE DEHYDROGENASE: LDH: 124 U/L (ref 98–192)

## 2022-04-25 NOTE — Progress Notes (Signed)
Hematology and Oncology Follow Up Visit  Stephen Evans [id number] [birth year] 76 y.o. 04/25/2022   Principle Diagnosis:  Stage Ia (T1cN0M0) adenocarcinoma of the pancreas-pancreas body  Current Therapy:   Status post Whipple procedure on 04/11/2020     Interim History:  Stephen Evans is back for follow-up.  He and his wife got back from a another cruise.  There were up in [place].  They had a wonderful time up there.  He is feeling much better.  Last time I saw him back in June, he was having some issues.  He did have a CT [facility] done at [facility] in July.  There is no evidence of any recurrent malignancy with respect to pancreatic cancer.  He also saw Urology.  He does not have any issues with his Pseudomonas UTIs.  He is not on antibiotics for this.  His last CA 19-9 was low but on the higher side at 59.   His appetite has been good.  He has had no change in bowel or bladder habits.  He does not have a stent into the biliary system.  This was removed after a couple months.  He has had no rashes.  There is been no leg swelling.  He has had no bleeding.  He has had no fever.  Thankfully, despite his travels, there is been no problems with COVID.  Overall, I would have to say that his performance status is probably ECOG 0.     Medications:  Current Outpatient Medications:    benzonatate (TESSALON PERLES) 100 MG capsule, 1-2 up to twice daily as needed for cough, Disp: 30 capsule, Rfl: 0   Multiple Vitamin (MULTIVITAMIN) tablet, Take 1 tablet by mouth daily., Disp: , Rfl:    tamsulosin (FLOMAX) 0.4 MG CAPS capsule, Take 1 capsule by mouth daily., Disp: , Rfl:    Testosterone 100 MG PLLT, Apply 1 application topically daily. , Disp: , Rfl:    valACYclovir (VALTREX) 1000 MG tablet, Take 1,000 mg by mouth daily as needed (cold sores).  (Patient not taking: Reported on 04/25/2022), Disp: , Rfl:   Allergies:  Allergies  Allergen Reactions   Prednisone Other (See Comments)    pancreatitis      Past Medical History, Surgical history, Social history, and Family History were reviewed and updated.  Review of Systems: Review of Systems  Constitutional: Negative.   HENT:  Negative.    Eyes: Negative.   Respiratory: Negative.    Cardiovascular: Negative.   Gastrointestinal:  Positive for abdominal pain.  Endocrine: Negative.   Genitourinary: Negative.    Musculoskeletal: Negative.   Skin: Negative.   Neurological: Negative.   Hematological: Negative.   Psychiatric/Behavioral: Negative.      Physical Exam:  height is '5\' 7"'$  (1.702 m) and weight is 201 lb 6.4 oz (91.4 kg). His oral temperature is 97.5 F (36.4 C) (abnormal). His blood pressure is 134/75 and his pulse is 72. His respiration is 20 and oxygen saturation is 100%.   Wt Readings from Last 3 Encounters:  04/25/22 201 lb 6.4 oz (91.4 kg)  04/10/22 198 lb (89.8 kg)  03/04/22 198 lb (89.8 kg)    Physical Exam Vitals reviewed.  HENT:     Head: Normocephalic and atraumatic.  Eyes:     Pupils: Pupils are equal, round, and reactive to light.  Cardiovascular:     Rate and Rhythm: Normal rate and regular rhythm.     Heart sounds: Normal heart sounds.  Pulmonary:  Effort: Pulmonary effort is normal.     Breath sounds: Normal breath sounds.  Abdominal:     General: Bowel sounds are normal.     Palpations: Abdomen is soft.     Comments: Abdominal exam shows a soft abdomen.  Bowel sounds are present.  Has well healed laparotomy scar in the midline.  He does not have a keloid formation.  There is no fluid wave.  He has little bit of tenderness throughout the abdomen to palpation.  He has no rebound tenderness.  There is no obvious fluid wave.  I cannot palpate his liver or spleen tip.  Musculoskeletal:        General: No tenderness or deformity. Normal range of motion.     Cervical back: Normal range of motion.  Lymphadenopathy:     Cervical: No cervical adenopathy.  Skin:    General: Skin is warm and dry.      Findings: No erythema or rash.  Neurological:     Mental Status: He is alert and oriented to person, place, and time.  Psychiatric:        Behavior: Behavior normal.        Thought Content: Thought content normal.        Judgment: Judgment normal.      Lab Results  Component Value Date   WBC 7.1 04/25/2022   HGB 14.4 04/25/2022   HCT 43.2 04/25/2022   MCV 90.8 04/25/2022   PLT 277 04/25/2022     Chemistry      Component Value Date/Time   NA 139 03/04/2022 1638   K 4.6 03/04/2022 1638   CL 107 03/04/2022 1638   CO2 30 03/04/2022 1638   BUN 16 03/04/2022 1638   CREATININE 0.89 03/04/2022 1638   CREATININE 0.84 12/06/2021 1010   CREATININE 0.90 05/21/2018 1606      Component Value Date/Time   CALCIUM 9.3 03/04/2022 1638   ALKPHOS 90 03/17/2022 1457   AST 12 03/17/2022 1457   AST 11 (L) 12/06/2021 1010   ALT 14 03/17/2022 1457   ALT 13 12/06/2021 1010   BILITOT 0.6 03/17/2022 1457   BILITOT 0.6 12/06/2021 1010      Impression and Plan: Stephen Evans is a very nice 68 year old white male.  Again he is very interesting to talk to.  He is originally from [place].  He worked for [employer].  He was in the [employer]  Everything looks fantastic.  We will have to see what his CA 19-9 is.  It would not surprise me if it is on the higher side despite every scan being negative.  I am just happy that his quality life is doing so well right now.  He and his wife will be going down to [place] in November.  I will plan to see him back next year.  We will get him back in the Winter.  I think that as long as his tumor marker is stable and he does not have symptoms, I do not think we need to do a scan on him.    [name], MD 9/29/20231:57 PM

## 2022-04-26 LAB — CANCER ANTIGEN 19-9: CA 19-9: 45 U/mL — ABNORMAL HIGH (ref 0–35)

## 2022-04-28 ENCOUNTER — Encounter: Payer: Self-pay | Admitting: *Deleted

## 2022-04-29 ENCOUNTER — Encounter: Payer: Self-pay | Admitting: Family

## 2022-04-29 ENCOUNTER — Other Ambulatory Visit: Payer: Medicare HMO

## 2022-04-29 DIAGNOSIS — R300 Dysuria: Secondary | ICD-10-CM

## 2022-04-30 LAB — URINE CULTURE
MICRO NUMBER:: 14000562
Result:: NO GROWTH
SPECIMEN QUALITY:: ADEQUATE

## 2022-06-12 ENCOUNTER — Encounter: Payer: Self-pay | Admitting: Family

## 2022-06-23 ENCOUNTER — Ambulatory Visit: Payer: Medicare HMO | Admitting: Family

## 2022-06-25 ENCOUNTER — Ambulatory Visit: Payer: Medicare HMO | Admitting: Family

## 2022-07-30 DIAGNOSIS — R69 Illness, unspecified: Secondary | ICD-10-CM | POA: Diagnosis not present

## 2022-07-31 DIAGNOSIS — N411 Chronic prostatitis: Secondary | ICD-10-CM | POA: Diagnosis not present

## 2022-07-31 DIAGNOSIS — N138 Other obstructive and reflux uropathy: Secondary | ICD-10-CM | POA: Diagnosis not present

## 2022-07-31 DIAGNOSIS — R399 Unspecified symptoms and signs involving the genitourinary system: Secondary | ICD-10-CM | POA: Diagnosis not present

## 2022-07-31 DIAGNOSIS — E291 Testicular hypofunction: Secondary | ICD-10-CM | POA: Diagnosis not present

## 2022-07-31 DIAGNOSIS — N401 Enlarged prostate with lower urinary tract symptoms: Secondary | ICD-10-CM | POA: Diagnosis not present

## 2022-08-04 DIAGNOSIS — L814 Other melanin hyperpigmentation: Secondary | ICD-10-CM | POA: Diagnosis not present

## 2022-08-04 DIAGNOSIS — D224 Melanocytic nevi of scalp and neck: Secondary | ICD-10-CM | POA: Diagnosis not present

## 2022-08-04 DIAGNOSIS — Z85820 Personal history of malignant melanoma of skin: Secondary | ICD-10-CM | POA: Diagnosis not present

## 2022-08-04 DIAGNOSIS — Z85828 Personal history of other malignant neoplasm of skin: Secondary | ICD-10-CM | POA: Diagnosis not present

## 2022-08-04 DIAGNOSIS — D1801 Hemangioma of skin and subcutaneous tissue: Secondary | ICD-10-CM | POA: Diagnosis not present

## 2022-08-04 DIAGNOSIS — L570 Actinic keratosis: Secondary | ICD-10-CM | POA: Diagnosis not present

## 2022-08-04 DIAGNOSIS — L821 Other seborrheic keratosis: Secondary | ICD-10-CM | POA: Diagnosis not present

## 2022-08-26 DIAGNOSIS — R69 Illness, unspecified: Secondary | ICD-10-CM | POA: Diagnosis not present

## 2022-09-12 ENCOUNTER — Other Ambulatory Visit: Payer: Self-pay

## 2022-09-12 ENCOUNTER — Inpatient Hospital Stay: Payer: Medicare HMO | Attending: Hematology & Oncology

## 2022-09-12 ENCOUNTER — Inpatient Hospital Stay: Payer: Medicare HMO | Admitting: Hematology & Oncology

## 2022-09-12 ENCOUNTER — Encounter: Payer: Self-pay | Admitting: Hematology & Oncology

## 2022-09-12 VITALS — BP 126/60 | HR 64 | Temp 98.0°F | Resp 18 | Wt 200.0 lb

## 2022-09-12 DIAGNOSIS — C251 Malignant neoplasm of body of pancreas: Secondary | ICD-10-CM

## 2022-09-12 DIAGNOSIS — C252 Malignant neoplasm of tail of pancreas: Secondary | ICD-10-CM | POA: Diagnosis not present

## 2022-09-12 LAB — CBC WITH DIFFERENTIAL (CANCER CENTER ONLY)
Abs Immature Granulocytes: 0.01 10*3/uL (ref 0.00–0.07)
Basophils Absolute: 0.1 10*3/uL (ref 0.0–0.1)
Basophils Relative: 1 %
Eosinophils Absolute: 0.2 10*3/uL (ref 0.0–0.5)
Eosinophils Relative: 4 %
HCT: 44 % (ref 39.0–52.0)
Hemoglobin: 14.7 g/dL (ref 13.0–17.0)
Immature Granulocytes: 0 %
Lymphocytes Relative: 31 %
Lymphs Abs: 1.6 10*3/uL (ref 0.7–4.0)
MCH: 30.3 pg (ref 26.0–34.0)
MCHC: 33.4 g/dL (ref 30.0–36.0)
MCV: 90.7 fL (ref 80.0–100.0)
Monocytes Absolute: 0.4 10*3/uL (ref 0.1–1.0)
Monocytes Relative: 8 %
Neutro Abs: 3 10*3/uL (ref 1.7–7.7)
Neutrophils Relative %: 56 %
Platelet Count: 237 10*3/uL (ref 150–400)
RBC: 4.85 MIL/uL (ref 4.22–5.81)
RDW: 12.6 % (ref 11.5–15.5)
WBC Count: 5.2 10*3/uL (ref 4.0–10.5)
nRBC: 0 % (ref 0.0–0.2)

## 2022-09-12 LAB — CMP (CANCER CENTER ONLY)
ALT: 16 U/L (ref 0–44)
AST: 11 U/L — ABNORMAL LOW (ref 15–41)
Albumin: 4.3 g/dL (ref 3.5–5.0)
Alkaline Phosphatase: 92 U/L (ref 38–126)
Anion gap: 7 (ref 5–15)
BUN: 18 mg/dL (ref 8–23)
CO2: 28 mmol/L (ref 22–32)
Calcium: 9.2 mg/dL (ref 8.9–10.3)
Chloride: 105 mmol/L (ref 98–111)
Creatinine: 0.89 mg/dL (ref 0.61–1.24)
GFR, Estimated: >60 mL/min (ref >60)
Glucose, Bld: 101 mg/dL — ABNORMAL HIGH (ref 70–99)
Potassium: 4.5 mmol/L (ref 3.5–5.1)
Sodium: 140 mmol/L (ref 135–145)
Total Bilirubin: 0.6 mg/dL (ref 0.3–1.2)
Total Protein: 7 g/dL (ref 6.5–8.1)

## 2022-09-12 LAB — LACTATE DEHYDROGENASE: LDH: 123 U/L (ref 98–192)

## 2022-09-12 NOTE — Progress Notes (Signed)
Hematology and Oncology Follow Up Visit  Stephen Evans [id number] 30-Mar-1946 77 y.o. 09/12/2022   Principle Diagnosis:  Stage Ia (T1cN0M0) adenocarcinoma of the pancreas-pancreas body  Current Therapy:   Status post Whipple procedure on 04/11/2020     Interim History:  Stephen Evans is back for follow-up.  As always, he and his wife have a big itinerary this year for traveling.  He, himself, will go down to [facility] in a couple weeks.  They do have a big trip planned in November.  They will be going down to the [facility].  Otherwise, he is doing well.  He has had a couple bouts of abdominal pain.  He does have a stent in his intestinal system.  He does see [facility] for this.  He had a last CA 19-9 be normal at 45.  He has had no cough or shortness of breath.  He has had no change in bowel or bladder habits.  He has had no rashes.  There is been no nausea or vomiting.  He has had no fever.  Thankfully, he is avoided COVID.  Overall, I would say that his performance status is ECOG 1.   Medications:  Current Outpatient Medications:    Testosterone 10 MG/ACT (2%) GEL, Apply 1 application  topically daily., Disp: , Rfl:    Multiple Vitamin (MULTIVITAMIN) tablet, Take 1 tablet by mouth daily., Disp: , Rfl:    tamsulosin (FLOMAX) 0.4 MG CAPS capsule, Take 1 capsule by mouth daily., Disp: , Rfl:    Testosterone 100 MG PLLT, Apply 1 application topically daily. , Disp: , Rfl:    valACYclovir (VALTREX) 1000 MG tablet, Take 1,000 mg by mouth daily as needed (cold sores).  (Patient not taking: Reported on 04/25/2022), Disp: , Rfl:   Allergies:  Allergies  Allergen Reactions   Prednisone Other (See Comments)    pancreatitis     Past Medical History, Surgical history, Social history, and Family History were reviewed and updated.  Review of Systems: Review of Systems  Constitutional: Negative.   HENT:  Negative.    Eyes: Negative.   Respiratory: Negative.    Cardiovascular: Negative.    Gastrointestinal:  Positive for abdominal pain.  Endocrine: Negative.   Genitourinary: Negative.    Musculoskeletal: Negative.   Skin: Negative.   Neurological: Negative.   Hematological: Negative.   Psychiatric/Behavioral: Negative.      Physical Exam:  weight is 200 lb (90.7 kg). His oral temperature is 98 F (36.7 C). His blood pressure is 126/60 and his pulse is 64. His respiration is 18 and oxygen saturation is 100%.   Wt Readings from Last 3 Encounters:  09/12/22 200 lb (90.7 kg)  04/25/22 201 lb 6.4 oz (91.4 kg)  04/10/22 198 lb (89.8 kg)    Physical Exam Vitals reviewed.  HENT:     Head: Normocephalic and atraumatic.  Eyes:     Pupils: Pupils are equal, round, and reactive to light.  Cardiovascular:     Rate and Rhythm: Normal rate and regular rhythm.     Heart sounds: Normal heart sounds.  Pulmonary:     Effort: Pulmonary effort is normal.     Breath sounds: Normal breath sounds.  Abdominal:     General: Bowel sounds are normal.     Palpations: Abdomen is soft.     Comments: Abdominal exam shows a soft abdomen.  Bowel sounds are present.  Has well healed laparotomy scar in the midline.  He does not have a  keloid formation.  There is no fluid wave.  He has little bit of tenderness throughout the abdomen to palpation.  He has no rebound tenderness.  There is no obvious fluid wave.  I cannot palpate his liver or spleen tip.  Musculoskeletal:        General: No tenderness or deformity. Normal range of motion.     Cervical back: Normal range of motion.  Lymphadenopathy:     Cervical: No cervical adenopathy.  Skin:    General: Skin is warm and dry.     Findings: No erythema or rash.  Neurological:     Mental Status: He is alert and oriented to person, place, and time.  Psychiatric:        Behavior: Behavior normal.        Thought Content: Thought content normal.        Judgment: Judgment normal.     Lab Results  Component Value Date   WBC 5.2 09/12/2022    HGB 14.7 09/12/2022   HCT 44.0 09/12/2022   MCV 90.7 09/12/2022   PLT 237 09/12/2022     Chemistry      Component Value Date/Time   NA 140 09/12/2022 1002   K 4.5 09/12/2022 1002   CL 105 09/12/2022 1002   CO2 28 09/12/2022 1002   BUN 18 09/12/2022 1002   CREATININE 0.89 09/12/2022 1002   CREATININE 0.90 05/21/2018 1606      Component Value Date/Time   CALCIUM 9.2 09/12/2022 1002   ALKPHOS 92 09/12/2022 1002   AST 11 (L) 09/12/2022 1002   ALT 16 09/12/2022 1002   BILITOT 0.6 09/12/2022 1002      Impression and Plan: Stephen Evans is a very nice 60 year old white male.  Again he is very interesting to talk to.  He is originally from [place].  He worked for [employer].  He was in the [employer]  Everything looks fantastic.  We will have to see what his CA 19-9 is.  He says that he is due for a CT scan.  This was done at [facility] in the June.  Again, we will see what the CA 19-9 looks like.  His exam is pretty much unremarkable.  I think we can just follow along with lab work.  For right now, we will plan to get him back in 6 months.    [name], MD 2/16/202410:53 AM

## 2022-09-13 LAB — CANCER ANTIGEN 19-9: CA 19-9: 48 U/mL — ABNORMAL HIGH (ref 0–35)

## 2022-09-15 ENCOUNTER — Telehealth: Payer: Self-pay | Admitting: *Deleted

## 2022-09-15 NOTE — Telephone Encounter (Signed)
-----   Message from [name], MD sent at 09/14/2022  7:37 AM EST ----- Call - the tumor level is 48.  This is stable!!  Stephen Evans

## 2022-09-24 DIAGNOSIS — R69 Illness, unspecified: Secondary | ICD-10-CM | POA: Diagnosis not present

## 2022-10-09 DIAGNOSIS — R69 Illness, unspecified: Secondary | ICD-10-CM | POA: Diagnosis not present

## 2022-10-16 DIAGNOSIS — R69 Illness, unspecified: Secondary | ICD-10-CM | POA: Diagnosis not present

## 2022-10-30 DIAGNOSIS — R69 Illness, unspecified: Secondary | ICD-10-CM | POA: Diagnosis not present

## 2022-11-03 DIAGNOSIS — R69 Illness, unspecified: Secondary | ICD-10-CM | POA: Diagnosis not present

## 2022-11-13 DIAGNOSIS — R69 Illness, unspecified: Secondary | ICD-10-CM | POA: Diagnosis not present

## 2022-11-25 DIAGNOSIS — R69 Illness, unspecified: Secondary | ICD-10-CM | POA: Diagnosis not present

## 2022-11-26 DIAGNOSIS — R69 Illness, unspecified: Secondary | ICD-10-CM | POA: Diagnosis not present

## 2022-12-04 DIAGNOSIS — R69 Illness, unspecified: Secondary | ICD-10-CM | POA: Diagnosis not present

## 2022-12-14 DIAGNOSIS — R69 Illness, unspecified: Secondary | ICD-10-CM | POA: Diagnosis not present

## 2022-12-17 ENCOUNTER — Ambulatory Visit (INDEPENDENT_AMBULATORY_CARE_PROVIDER_SITE_OTHER): Payer: Medicare HMO | Admitting: Family

## 2022-12-17 VITALS — BP 127/70 | HR 71 | Temp 97.6°F | Resp 16 | Wt 205.0 lb

## 2022-12-17 DIAGNOSIS — C251 Malignant neoplasm of body of pancreas: Secondary | ICD-10-CM

## 2022-12-17 DIAGNOSIS — R5383 Other fatigue: Secondary | ICD-10-CM

## 2022-12-17 DIAGNOSIS — E291 Testicular hypofunction: Secondary | ICD-10-CM

## 2022-12-17 LAB — URINALYSIS, ROUTINE W REFLEX MICROSCOPIC
Bilirubin Urine: NEGATIVE
Hgb urine dipstick: NEGATIVE
Ketones, ur: NEGATIVE
Nitrite: NEGATIVE
RBC / HPF: NONE SEEN (ref 0–?)
Specific Gravity, Urine: 1.01 (ref 1.000–1.030)
Total Protein, Urine: NEGATIVE
Urine Glucose: NEGATIVE
Urobilinogen, UA: 0.2 (ref 0.0–1.0)
pH: 6 (ref 5.0–8.0)

## 2022-12-17 LAB — COMPREHENSIVE METABOLIC PANEL
ALT: 15 U/L (ref 0–53)
AST: 11 U/L (ref 0–37)
Albumin: 4 g/dL (ref 3.5–5.2)
Alkaline Phosphatase: 77 U/L (ref 39–117)
BUN: 18 mg/dL (ref 6–23)
CO2: 27 mEq/L (ref 19–32)
Calcium: 9.1 mg/dL (ref 8.4–10.5)
Chloride: 105 mEq/L (ref 96–112)
Creatinine, Ser: 0.76 mg/dL (ref 0.40–1.50)
GFR: 88.47 mL/min (ref >60.00)
Glucose, Bld: 93 mg/dL (ref 70–99)
Potassium: 4.7 mEq/L (ref 3.5–5.1)
Sodium: 139 mEq/L (ref 135–145)
Total Bilirubin: 0.6 mg/dL (ref 0.2–1.2)
Total Protein: 6.3 g/dL (ref 6.0–8.3)

## 2022-12-17 LAB — CBC WITH DIFFERENTIAL/PLATELET
Basophils Absolute: 0 10*3/uL (ref 0.0–0.1)
Basophils Relative: 0.8 % (ref 0.0–3.0)
Eosinophils Absolute: 0.3 10*3/uL (ref 0.0–0.7)
Eosinophils Relative: 4.9 % (ref 0.0–5.0)
HCT: 42.2 % (ref 39.0–52.0)
Hemoglobin: 14 g/dL (ref 13.0–17.0)
Lymphocytes Relative: 29.9 % (ref 12.0–46.0)
Lymphs Abs: 1.6 10*3/uL (ref 0.7–4.0)
MCHC: 33.2 g/dL (ref 30.0–36.0)
MCV: 91.3 fl (ref 78.0–100.0)
Monocytes Absolute: 0.4 10*3/uL (ref 0.1–1.0)
Monocytes Relative: 6.9 % (ref 3.0–12.0)
Neutro Abs: 3.1 10*3/uL (ref 1.4–7.7)
Neutrophils Relative %: 57.5 % (ref 43.0–77.0)
Platelets: 211 10*3/uL (ref 150.0–400.0)
RBC: 4.62 Mil/uL (ref 4.22–5.81)
RDW: 13.2 % (ref 11.5–15.5)
WBC: 5.3 10*3/uL (ref 4.0–10.5)

## 2022-12-17 LAB — TSH: TSH: 1.48 u[IU]/mL (ref 0.35–5.50)

## 2022-12-17 NOTE — Assessment & Plan Note (Signed)
Has follow up scheduled with oncology.

## 2022-12-17 NOTE — Assessment & Plan Note (Signed)
On testosterone supplement per Urology- will check follow up testosterone level.

## 2022-12-17 NOTE — Progress Notes (Signed)
Subjective:     Patient ID: Stephen Evans, male    DOB: November 14, 1945, 77 y.o.   MRN: [id number]  Chief Complaint  Patient presents with   Dizziness    Complains of on and off dizziness for the last few weeks with double vision at times   Shortness of Breath    Complains of sob at times     Patient is a 77 yr old male who presents with several symptoms.   Oncology- sees Dr. Flonnie Hailstone in July and plan is for CT scan  Urology- scheduled.  He feels "like something is a miss." Notes that he had an episode of diplopia which was vertical.  If he closed one eye he was fine.  This happened once in the past 8-12 months ago.  Did not have associated HA.  He does report some neck stiffness.  This has improved but not back to normal- ibuprofen has been helpful. Notes fatigue which is new.  He takes afternoon naps occasionally- more than usual.  In regards to breathing, he doesn't truly have SOB, more due to fatigue.  He is concerned that his testosterone dose may not be enough.  He also reported that he had a bad urine odor last week for one day only.  Notes CA-19.9 is elevated.    Health Maintenance Due  Topic Date Due   Hepatitis C Screening  Never done   Zoster Vaccines- Shingrix (1 of 2) Never done   Medicare Annual Wellness (AWV)  08/17/2015   COVID-19 Vaccine (6 - 2023-24 season) 03/28/2022    Past Medical History:  Diagnosis Date   Goals of care, counseling/discussion 05/28/2020   Melanoma (HCC) x 2   Melanoma (HCC)    Melanoma (HCC)    Pancreas cancer (HCC) 05/28/2020   Pancreatitis 06/2018   thought to be due to alcohol   Testicular cancer [facility]) 2011   testicular--radiation   Testicular tumor     Past Surgical History:  Procedure Laterality Date   BIOPSY  01/18/2020   Procedure: BIOPSY;  Surgeon: Lemar Lofty., MD;  Location: [facility];  Service: Gastroenterology;;   COLONOSCOPY  11/01/2003   ESOPHAGOGASTRODUODENOSCOPY (EGD) WITH PROPOFOL N/A 01/18/2020   Procedure:  ESOPHAGOGASTRODUODENOSCOPY (EGD) WITH PROPOFOL;  Surgeon: Lemar Lofty., MD;  Location: [facility];  Service: Gastroenterology;  Laterality: N/A;   EUS N/A 01/18/2020   Procedure: UPPER ENDOSCOPIC ULTRASOUND (EUS) LINEAR;  Surgeon: Lemar Lofty., MD;  Location: [facility];  Service: Gastroenterology;  Laterality: N/A;   FINE NEEDLE ASPIRATION N/A 01/18/2020   Procedure: FINE NEEDLE ASPIRATION (FNA) LINEAR;  Surgeon: Lemar Lofty., MD;  Location: [facility];  Service: Gastroenterology;  Laterality: N/A;   LYMPH GLAND EXCISION Right 1979   axillary, benign   MELANOMA EXCISION Right 03/2014   elbow   ORCHIECTOMY Right 2001   followed by radiation   TONSILLECTOMY  1959   wide excision of melanoma on back  1979    Family History  Problem Relation Age of Onset   Epilepsy Mother    Esophageal cancer Father 44       smoking/drinking hx   Breast cancer Sister 85   Alcohol abuse Brother    Cancer Maternal Uncle        unknown type; d. late 109s   Testicular cancer Maternal Grandfather        unknown age   Colon cancer Neg Hx    Colon polyps Neg Hx    Rectal cancer Neg  Hx    Stomach cancer Neg Hx    Pancreatic cancer Neg Hx     Social History   Socioeconomic History   Marital status: Married    Spouse name: Not on file   Number of children: 2   Years of education: Not on file   Highest education level: Bachelor's degree (e.g., BA, AB, BS)  Occupational History   Occupation: retired    Comment: Associate Professor  Tobacco Use   Smoking status: Former    Years: 30    Types: Cigarettes    Quit date: 09/06/1997    Years since quitting: 25.2   Smokeless tobacco: Never  Vaping Use   Vaping Use: Never used  Substance and Sexual Activity   Alcohol use: Yes    Comment: 1 every 2 weeks   Drug use: No   Sexual activity: Not Currently  Other Topics Concern   Not on file  Social History Narrative   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 [employer]   Married- second marriage x 30 yrs   72 yr old son- lives in [place], [place] manages a private hedge fund   10 yr old daughter- lives in [place], [place]   Enjoys golf   Social Determinants of Health   Financial Resource Strain: Low Risk  (12/16/2022)   Overall Financial Resource Strain (CARDIA)    Difficulty of Paying Living Expenses: Not hard at all  Food Insecurity: No Food Insecurity (12/16/2022)   Hunger Vital Sign    Worried About Running Out of Food in the Last Year: Never true    Ran Out of Food in the Last Year: Never true  Transportation Needs: No Transportation Needs (12/16/2022)   PRAPARE - Administrator, Civil Service (Medical): No    Lack of Transportation (Non-Medical): No  Physical Activity: Sufficiently Active (12/16/2022)   Exercise Vital Sign    Days of Exercise per Week: 2 days    Minutes of Exercise per Session: 140 min  Stress: No Stress Concern Present (12/16/2022)   [employer] of Occupational Health - Occupational Stress Questionnaire    Feeling of Stress : Only a little  Social Connections: Moderately Integrated (12/16/2022)   Social Connection and Isolation Panel [NHANES]    Frequency of Communication with Friends and Family: Three times a week    Frequency of Social Gatherings with Friends and Family: Once a week    Attends Religious Services: Never    Database administrator or Organizations: Yes    Attends Engineer, structural: More than 4 times per year    Marital Status: Married  Catering manager Violence: Not on file    Outpatient Medications Prior to Visit  Medication Sig Dispense Refill   Multiple Vitamin (MULTIVITAMIN) tablet Take 1 tablet by mouth daily.     Testosterone 10 MG/ACT (2%) GEL Apply 1 application  topically daily.     valACYclovir (VALTREX) 1000 MG tablet Take 1,000 mg by mouth daily as needed (cold sores).     Testosterone 100 MG PLLT Apply 1 application  topically daily.      No facility-administered medications prior to visit.    Allergies  Allergen Reactions   Prednisone Other (See Comments)    pancreatitis     Review of Systems  Respiratory:  Positive for shortness of breath.   Neurological:  Positive for dizziness.      See HPI  Objective:  Physical Exam Constitutional:      General: He is not in acute distress.    Appearance: He is well-developed.  HENT:     Head: Normocephalic and atraumatic.  Cardiovascular:     Rate and Rhythm: Normal rate and regular rhythm.     Heart sounds: No murmur heard. Pulmonary:     Effort: Pulmonary effort is normal. No respiratory distress.     Breath sounds: Normal breath sounds. No wheezing or rales.  Abdominal:     Palpations: Abdomen is soft. There is no mass.     Tenderness: There is abdominal tenderness (mild upper abdominal tenderness to palpation (at his post surgical baseline)).  Skin:    General: Skin is warm and dry.  Neurological:     Mental Status: He is alert and oriented to person, place, and time.  Psychiatric:        Behavior: Behavior normal.        Thought Content: Thought content normal.     BP 127/70 (BP Location: Right Arm, Patient Position: Sitting, Cuff Size: Small)   Pulse 71   Temp 97.6 F (36.4 C) (Oral)   Resp 16   Wt 205 lb (93 kg)   SpO2 99%   BMI 32.11 kg/m  Wt Readings from Last 3 Encounters:  12/17/22 205 lb (93 kg)  09/12/22 200 lb (90.7 kg)  04/25/22 201 lb 6.4 oz (91.4 kg)       Assessment & Plan:   Problem List Items Addressed This Visit       Unprioritized   Pancreas cancer (HCC) (Chronic)    Has follow up scheduled with oncology.       Hypogonadism in male    On testosterone supplement per Urology- will check follow up testosterone level.        Relevant Orders   Testosterone Total,Free,Bio, Males-([facility])   Fatigue - Primary    New.  Will check labs as below.        Relevant Orders   Comp Met (CMET)   CBC  w/Diff   Urine Culture   Urinalysis, Routine w reflex microscopic   TSH   EKG 12-Lead (Completed)    I am having Theron Arista K. Rebert maintain his Testosterone, multivitamin, valACYclovir, and Testosterone.  No orders of the defined types were placed in this encounter.

## 2022-12-17 NOTE — Assessment & Plan Note (Addendum)
New.  Will check labs as below.  EKG tracing is personally reviewed.  EKG notes NSR.  No acute changes.

## 2022-12-18 ENCOUNTER — Telehealth: Payer: Self-pay | Admitting: Family

## 2022-12-18 DIAGNOSIS — R69 Illness, unspecified: Secondary | ICD-10-CM | POA: Diagnosis not present

## 2022-12-18 LAB — TESTOSTERONE TOTAL,FREE,BIO, MALES
Albumin: 4.2 g/dL (ref 3.6–5.1)
Sex Hormone Binding: 47 nmol/L (ref 22–77)
Testosterone: 155 ng/dL — ABNORMAL LOW (ref 250–827)

## 2022-12-18 MED ORDER — CEPHALEXIN 500 MG PO CAPS: 500.0000 mg | ORAL_CAPSULE | Freq: Two times a day (BID) | ORAL | 0 refills | Status: DC

## 2022-12-18 NOTE — Telephone Encounter (Signed)
Pt called back and labs reviewed. Pt did pick up medicaiton

## 2022-12-18 NOTE — Telephone Encounter (Signed)
Please advise pt that his testosterone level is low.  This could be contributing to his fatigue.  I would recommend that he reach back out to his neurologist and discuss dose adjustment.  Urinalysis is also suggestive of UTI.  Culture is still pending. I would like to get him started on an antibiotic while we wait for the culture.  Rx sent for keflex to [facility].

## 2022-12-18 NOTE — Telephone Encounter (Signed)
Called patient but no answer, left voice mail for patient to call back.   

## 2022-12-19 LAB — URINE CULTURE
MICRO NUMBER:: 14989978
SPECIMEN QUALITY:: ADEQUATE

## 2022-12-25 DIAGNOSIS — R69 Illness, unspecified: Secondary | ICD-10-CM | POA: Diagnosis not present

## 2022-12-26 ENCOUNTER — Encounter: Payer: Self-pay | Admitting: Family

## 2022-12-26 ENCOUNTER — Other Ambulatory Visit (HOSPITAL_BASED_OUTPATIENT_CLINIC_OR_DEPARTMENT_OTHER): Payer: Self-pay

## 2022-12-26 MED ORDER — CIPROFLOXACIN HCL 250 MG PO TABS
250.0000 mg | ORAL_TABLET | Freq: Two times a day (BID) | ORAL | 0 refills | Status: AC
  Filled 2022-12-26: qty 10, 5d supply, fill #0

## 2023-01-15 DIAGNOSIS — R69 Illness, unspecified: Secondary | ICD-10-CM | POA: Diagnosis not present

## 2023-01-22 DIAGNOSIS — R69 Illness, unspecified: Secondary | ICD-10-CM | POA: Diagnosis not present

## 2023-01-28 DIAGNOSIS — R69 Illness, unspecified: Secondary | ICD-10-CM | POA: Diagnosis not present

## 2023-02-04 DIAGNOSIS — N411 Chronic prostatitis: Secondary | ICD-10-CM | POA: Diagnosis not present

## 2023-02-04 DIAGNOSIS — E291 Testicular hypofunction: Secondary | ICD-10-CM | POA: Diagnosis not present

## 2023-02-05 DIAGNOSIS — R69 Illness, unspecified: Secondary | ICD-10-CM | POA: Diagnosis not present

## 2023-02-10 DIAGNOSIS — R918 Other nonspecific abnormal finding of lung field: Secondary | ICD-10-CM | POA: Diagnosis not present

## 2023-02-10 DIAGNOSIS — C250 Malignant neoplasm of head of pancreas: Secondary | ICD-10-CM | POA: Diagnosis not present

## 2023-02-10 DIAGNOSIS — K7689 Other specified diseases of liver: Secondary | ICD-10-CM | POA: Diagnosis not present

## 2023-02-10 DIAGNOSIS — C259 Malignant neoplasm of pancreas, unspecified: Secondary | ICD-10-CM | POA: Diagnosis not present

## 2023-02-12 DIAGNOSIS — R69 Illness, unspecified: Secondary | ICD-10-CM | POA: Diagnosis not present

## 2023-03-08 ENCOUNTER — Encounter: Payer: Self-pay | Admitting: Family

## 2023-03-09 ENCOUNTER — Ambulatory Visit (INDEPENDENT_AMBULATORY_CARE_PROVIDER_SITE_OTHER): Payer: Medicare HMO | Admitting: Physician Assistant

## 2023-03-09 ENCOUNTER — Encounter: Payer: Self-pay | Admitting: Physician Assistant

## 2023-03-09 VITALS — BP 122/70 | HR 78 | Temp 98.5°F | Resp 20 | Wt 206.0 lb

## 2023-03-09 DIAGNOSIS — R1013 Epigastric pain: Secondary | ICD-10-CM

## 2023-03-09 NOTE — Progress Notes (Signed)
Established patient visit   Patient: Stephen Evans   DOB: 02/13/1946   77 y.o. Male  MRN: [id number] Visit Date: 03/09/2023  Today's healthcare provider: Alfredia Ferguson, PA-C   Cc. Abdominal pain   Subjective    HPI   Pt has a history of pancreatic cancer, s/p whipple. H/o of needing bile duct stents, per pt last one was 15 mo ago.   Starting x 2 days ago, severe upper GI pain with fatigue, chills, no appetite, nausea. Denies vomiting. Reports pain lasted ~ 2 hours. Yesterday similar episode but after dinner felt fine. Today denies pain, but fatigue persists. Denies changes in BM.  Reports a cruise 10 days ago. Medications: Outpatient Medications Prior to Visit  Medication Sig   cephALEXin (KEFLEX) 500 MG capsule Take 1 capsule (500 mg total) by mouth 2 (two) times daily.   Multiple Vitamin (MULTIVITAMIN) tablet Take 1 tablet by mouth daily.   Testosterone 10 MG/ACT (2%) GEL Apply 1 application  topically daily.   valACYclovir (VALTREX) 1000 MG tablet Take 1,000 mg by mouth daily as needed (cold sores).   No facility-administered medications prior to visit.    Review of Systems  Constitutional:  Negative for fatigue and fever.  Respiratory:  Negative for cough and shortness of breath.   Cardiovascular:  Negative for chest pain, palpitations and leg swelling.  Gastrointestinal:  Positive for abdominal pain and nausea.  Neurological:  Negative for dizziness and headaches.      Objective    BP 122/70 (BP Location: Left Arm, Patient Position: Sitting, Cuff Size: Normal)   Pulse 78   Temp 98.5 F (36.9 C) (Oral)   Resp 20   Wt 206 lb (93.4 kg)   SpO2 99%   BMI 32.26 kg/m   Physical Exam Constitutional:      General: He is awake.     Appearance: He is well-developed.  HENT:     Head: Normocephalic.  Eyes:     Conjunctiva/sclera: Conjunctivae normal.  Cardiovascular:     Rate and Rhythm: Normal rate and regular rhythm.     Heart sounds: Normal heart sounds.   Pulmonary:     Effort: Pulmonary effort is normal.     Breath sounds: Normal breath sounds.  Abdominal:     Tenderness: There is no abdominal tenderness. There is no guarding or rebound.  Skin:    General: Skin is warm.  Neurological:     Mental Status: He is alert and oriented to person, place, and time.  Psychiatric:        Attention and Perception: Attention normal.        Mood and Affect: Mood normal.        Speech: Speech normal.        Behavior: Behavior is cooperative.      No results found for any visits on 03/09/23.  Assessment & Plan     1. Epigastric pain Benign abdomen today. Vitals wnl, afebrile. Ordered labs. Encouraged liquid diet/bland foods in case this was pancreatitis that is resolving.  Advised if pain returns to call office, would order stat RUQ/epigastric ultrasound r/o bile duct occulusion/torsion per pt's history  - Lipase - CBC w/Diff - Comp Met (CMET)   Return if symptoms worsen or fail to improve.      I, Alfredia Ferguson, PA-C have reviewed all documentation for this visit. The documentation on  03/09/23   for the exam, diagnosis, procedures, and orders are all accurate and complete.  Alfredia Ferguson, PA-C  [facility] [phone] (phone) [phone] (fax)  [facility]

## 2023-03-10 ENCOUNTER — Other Ambulatory Visit: Payer: Self-pay | Admitting: Physician Assistant

## 2023-03-10 ENCOUNTER — Encounter: Payer: Self-pay | Admitting: Physician Assistant

## 2023-03-10 DIAGNOSIS — R7989 Other specified abnormal findings of blood chemistry: Secondary | ICD-10-CM

## 2023-03-11 ENCOUNTER — Ambulatory Visit (HOSPITAL_BASED_OUTPATIENT_CLINIC_OR_DEPARTMENT_OTHER)
Admission: RE | Admit: 2023-03-11 | Discharge: 2023-03-11 | Disposition: A | Discharge status: Home / Self Care | Payer: Medicare HMO | Source: Ambulatory Visit | Attending: Physician Assistant | Admitting: Physician Assistant

## 2023-03-11 ENCOUNTER — Telehealth: Payer: Self-pay | Admitting: Physician Assistant

## 2023-03-11 ENCOUNTER — Other Ambulatory Visit: Payer: Self-pay | Admitting: Physician Assistant

## 2023-03-11 ENCOUNTER — Encounter: Payer: Self-pay | Admitting: Physician Assistant

## 2023-03-11 DIAGNOSIS — K760 Fatty (change of) liver, not elsewhere classified: Secondary | ICD-10-CM | POA: Diagnosis not present

## 2023-03-11 DIAGNOSIS — R7989 Other specified abnormal findings of blood chemistry: Secondary | ICD-10-CM | POA: Insufficient documentation

## 2023-03-11 DIAGNOSIS — R1013 Epigastric pain: Secondary | ICD-10-CM | POA: Insufficient documentation

## 2023-03-11 NOTE — Telephone Encounter (Signed)
Spoke w/ pt Had one episode of 11/10 epigastric/ruq abd pain. Called 911, but pain passed after 15 minutes We had discussed ruq sono if this reoccurred, ordered. Advised pt if pain returns and persists to follow through with going to the ED

## 2023-03-11 NOTE — Telephone Encounter (Signed)
Please [facility] the [facility] report [facility]

## 2023-03-11 NOTE — Telephone Encounter (Signed)
Pt called & stated he was having an emergency & need immediate assistance. I transferred him to Triage for assistance.

## 2023-03-12 ENCOUNTER — Telehealth: Payer: Self-pay

## 2023-03-12 NOTE — Telephone Encounter (Signed)
Patient spoke to Alfredia Ferguson NP yesterday after the triage call and was advised on what to do if pain returned or got worse.

## 2023-03-12 NOTE — Telephone Encounter (Signed)
Patient Name: Stephen Evans DOB: Dec 04, 1945 Age: 77 Y 10 M 6 D [facility] - Client Client Site [facility] - Day Provider Sandford Craze - NP Contact Type Call Who Is Calling Patient / Member / Family / Caregiver Call Type Triage / Clinical Relationship To Patient Self Return Phone Number [phone] (Primary) Chief Complaint CHEST PAIN - pain, pressure, heaviness or tightness Reason for Call Symptomatic / Request for Health Information Initial Comment Caller states he was saw on Monday. States he would like to get a sonogram done. States he just had another attack in upper abdomen. States liver enzymes are elevated. Translation No No Triage Reason Patient declined Nurse Assessment Nurse: Lily Kocher, RN, Adriana Date/Time (Eastern Time): 03/11/2023 12:35:56 PM Confirm and document reason for call. If symptomatic, describe symptoms. ---pt states he was seen on Monday d/t pain to abd on Saturday. blood work was ordered and liver enzymes were elevated. was told to call back so they can order an [place]. pt has history of biliary stents and whipple. is followed by GI. last had abd pain about 30 min ago. no pain currently Does the patient have any new or worsening symptoms? ---Yes Will a triage be completed? ---No Select reason for no triage. ---Patient declined Please document clinical information provided and list any resource used. ---transferred to office. Disp. Time Lamount Cohen Time) Disposition Final User 03/11/2023 12:33:27 PM Send to Urgent Queue Adriana Reams 03/11/2023 12:44:37 PM Clinical Call Yes Lily Kocher RN, Adriana Final Disposition 03/11/2023 12:44:37 PM Clinical Call Yes Lily Kocher, RN, Ricki Rodriguez

## 2023-03-12 NOTE — Telephone Encounter (Signed)
Ultrasound was faxed on 03/11/2023

## 2023-03-13 ENCOUNTER — Inpatient Hospital Stay: Payer: Medicare HMO | Admitting: Hematology & Oncology

## 2023-03-13 ENCOUNTER — Other Ambulatory Visit: Payer: Self-pay

## 2023-03-13 ENCOUNTER — Inpatient Hospital Stay: Payer: Medicare HMO | Attending: Hematology & Oncology

## 2023-03-13 ENCOUNTER — Encounter: Payer: Self-pay | Admitting: Hematology & Oncology

## 2023-03-13 VITALS — BP 131/59 | HR 66 | Temp 97.9°F | Resp 18 | Ht 67.0 in | Wt 201.1 lb

## 2023-03-13 DIAGNOSIS — C251 Malignant neoplasm of body of pancreas: Secondary | ICD-10-CM

## 2023-03-13 DIAGNOSIS — R7989 Other specified abnormal findings of blood chemistry: Secondary | ICD-10-CM | POA: Insufficient documentation

## 2023-03-13 DIAGNOSIS — R109 Unspecified abdominal pain: Secondary | ICD-10-CM | POA: Diagnosis not present

## 2023-03-13 DIAGNOSIS — C252 Malignant neoplasm of tail of pancreas: Secondary | ICD-10-CM

## 2023-03-13 DIAGNOSIS — C259 Malignant neoplasm of pancreas, unspecified: Secondary | ICD-10-CM | POA: Diagnosis not present

## 2023-03-13 LAB — CMP (CANCER CENTER ONLY)
ALT: 53 U/L — ABNORMAL HIGH (ref 0–44)
AST: 16 U/L (ref 15–41)
Albumin: 4.3 g/dL (ref 3.5–5.0)
Alkaline Phosphatase: 105 U/L (ref 38–126)
Anion gap: 10 (ref 5–15)
BUN: 20 mg/dL (ref 8–23)
CO2: 27 mmol/L (ref 22–32)
Calcium: 9.4 mg/dL (ref 8.9–10.3)
Chloride: 104 mmol/L (ref 98–111)
Creatinine: 0.94 mg/dL (ref 0.61–1.24)
GFR, Estimated: >60 mL/min (ref >60)
Glucose, Bld: 113 mg/dL — ABNORMAL HIGH (ref 70–99)
Potassium: 4 mmol/L (ref 3.5–5.1)
Sodium: 141 mmol/L (ref 135–145)
Total Bilirubin: 0.6 mg/dL (ref 0.3–1.2)
Total Protein: 7.1 g/dL (ref 6.5–8.1)

## 2023-03-13 LAB — CBC WITH DIFFERENTIAL (CANCER CENTER ONLY)
Abs Immature Granulocytes: 0.01 10*3/uL (ref 0.00–0.07)
Basophils Absolute: 0 10*3/uL (ref 0.0–0.1)
Basophils Relative: 1 %
Eosinophils Absolute: 0.2 10*3/uL (ref 0.0–0.5)
Eosinophils Relative: 3 %
HCT: 42.3 % (ref 39.0–52.0)
Hemoglobin: 14.1 g/dL (ref 13.0–17.0)
Immature Granulocytes: 0 %
Lymphocytes Relative: 27 %
Lymphs Abs: 1.5 10*3/uL (ref 0.7–4.0)
MCH: 30.5 pg (ref 26.0–34.0)
MCHC: 33.3 g/dL (ref 30.0–36.0)
MCV: 91.6 fL (ref 80.0–100.0)
Monocytes Absolute: 0.4 10*3/uL (ref 0.1–1.0)
Monocytes Relative: 7 %
Neutro Abs: 3.6 10*3/uL (ref 1.7–7.7)
Neutrophils Relative %: 62 %
Platelet Count: 213 10*3/uL (ref 150–400)
RBC: 4.62 MIL/uL (ref 4.22–5.81)
RDW: 12.6 % (ref 11.5–15.5)
WBC Count: 5.8 10*3/uL (ref 4.0–10.5)
nRBC: 0 % (ref 0.0–0.2)

## 2023-03-13 LAB — LACTATE DEHYDROGENASE: LDH: 135 U/L (ref 98–192)

## 2023-03-13 NOTE — Progress Notes (Signed)
Hematology and Oncology Follow Up Visit  Stephen Evans [id number] [birth year] 77 y.o. 03/13/2023   Principle Diagnosis:  Stage Ia (T1cN0M0) adenocarcinoma of the pancreas-pancreas body  Current Therapy:   Status post Whipple procedure on 04/11/2020     Interim History:  Stephen Evans is back for follow-up.  Unfortunately, he has had a bad time this week.  Starting last Saturday, he began to have this intermittent abdominal pain.  He ultimately had to go to his family doctor.  He did have a ultrasound done.  This was relatively unremarkable and the right upper quadrant.  He is already had a cholecystectomy.  He had lab work did show some elevated LFTs.  These have improved.  He actually is going to go see his surgical oncologist at [facility] today.  He and his wife for a wonderful [place] cruise a few weeks ago.  That a wonderful time.  He is little bit worried that they have another cruise down to the [employer] and November.  He is worried about having this episode of pain.  It certainly sounds like he may have had some transient bowel obstruction.  It does not sound like pancreatitis.  He had no nausea or vomiting.  He may have had a little nausea.  He had no diarrhea.  He had no bleeding.  He had no fever.  Of note, his last CA 19-9 was 48.  He has had no rashes.  There is been no cough or shortness of breath.  Overall, I would say his performance status is probably ECOG 1.    Medications:  Current Outpatient Medications:    Multiple Vitamin (MULTIVITAMIN) tablet, Take 1 tablet by mouth daily., Disp: , Rfl:    Testosterone 10 MG/ACT (2%) GEL, Apply 1 application  topically daily., Disp: , Rfl:    valACYclovir (VALTREX) 1000 MG tablet, Take 1,000 mg by mouth daily as needed (cold sores)., Disp: , Rfl:   Allergies:  Allergies  Allergen Reactions   Prednisone Other (See Comments)    pancreatitis     Past Medical History, Surgical history, Social history, and Family History were  reviewed and updated.  Review of Systems: Review of Systems  Constitutional: Negative.   HENT:  Negative.    Eyes: Negative.   Respiratory: Negative.    Cardiovascular: Negative.   Gastrointestinal:  Positive for abdominal pain.  Endocrine: Negative.   Genitourinary: Negative.    Musculoskeletal: Negative.   Skin: Negative.   Neurological: Negative.   Hematological: Negative.   Psychiatric/Behavioral: Negative.      Physical Exam:  height is 5\' 7"  (1.702 m) and weight is 201 lb 1.9 oz (91.2 kg). His oral temperature is 97.9 F (36.6 C). His blood pressure is 131/59 (abnormal) and his pulse is 66. His respiration is 18 and oxygen saturation is 100%.   Wt Readings from Last 3 Encounters:  03/13/23 201 lb 1.9 oz (91.2 kg)  03/09/23 206 lb (93.4 kg)  12/17/22 205 lb (93 kg)    Physical Exam Vitals reviewed.  HENT:     Head: Normocephalic and atraumatic.  Eyes:     Pupils: Pupils are equal, round, and reactive to light.  Cardiovascular:     Rate and Rhythm: Normal rate and regular rhythm.     Heart sounds: Normal heart sounds.  Pulmonary:     Effort: Pulmonary effort is normal.     Breath sounds: Normal breath sounds.  Abdominal:     General: Bowel sounds are normal.  Palpations: Abdomen is soft.     Comments: Abdominal exam shows a soft abdomen.  Bowel sounds are present.  Has well healed laparotomy scar in the midline.  He does not have a keloid formation.  There is no fluid wave.  He has little bit of tenderness throughout the abdomen to palpation.  He has no rebound tenderness.  There is no obvious fluid wave.  I cannot palpate his liver or spleen tip.  Musculoskeletal:        General: No tenderness or deformity. Normal range of motion.     Cervical back: Normal range of motion.  Lymphadenopathy:     Cervical: No cervical adenopathy.  Skin:    General: Skin is warm and dry.     Findings: No erythema or rash.  Neurological:     Mental Status: He is alert and  oriented to person, place, and time.  Psychiatric:        Behavior: Behavior normal.        Thought Content: Thought content normal.        Judgment: Judgment normal.     Lab Results  Component Value Date   WBC 5.8 03/13/2023   HGB 14.1 03/13/2023   HCT 42.3 03/13/2023   MCV 91.6 03/13/2023   PLT 213 03/13/2023     Chemistry      Component Value Date/Time   NA 141 03/13/2023 1001   K 4.0 03/13/2023 1001   CL 104 03/13/2023 1001   CO2 27 03/13/2023 1001   BUN 20 03/13/2023 1001   CREATININE 0.94 03/13/2023 1001   CREATININE 0.90 05/21/2018 1606      Component Value Date/Time   CALCIUM 9.4 03/13/2023 1001   ALKPHOS 105 03/13/2023 1001   AST 16 03/13/2023 1001   ALT 53 (H) 03/13/2023 1001   BILITOT 0.6 03/13/2023 1001      Impression and Plan: Stephen Evans is a very nice 72 year old white male.  Again he is very interesting to talk to.  He is originally from [place].  He worked for [employer].  He was in the [employer].  I still cannot explain this abdominal pain that he had.  Again, it sounds like he had transient bowel obstruction.  It will be interesting to see what Stephen Evans at [facility] says about all this.  For right now, we will plan to get him back after the Holidays.  I know that he will have a wonderful time down in [place].    Stephen Macho, MD 8/16/202410:39 AM

## 2023-03-15 LAB — CANCER ANTIGEN 19-9: CA 19-9: 52 U/mL — ABNORMAL HIGH (ref 0–35)

## 2023-03-16 ENCOUNTER — Encounter: Payer: Self-pay | Admitting: Family

## 2023-03-16 ENCOUNTER — Telehealth: Payer: Self-pay

## 2023-03-16 NOTE — Telephone Encounter (Signed)
Advised via MyChart.

## 2023-03-16 NOTE — Telephone Encounter (Signed)
-----   Message from Stephen Evans sent at 03/16/2023  5:40 AM EDT ----- Call - the CA 19-9 is stable at 52.  [name]

## 2023-03-26 ENCOUNTER — Other Ambulatory Visit: Payer: Self-pay | Admitting: Oncology

## 2023-03-26 DIAGNOSIS — Z006 Encounter for examination for normal comparison and control in clinical research program: Secondary | ICD-10-CM

## 2023-04-08 DIAGNOSIS — Z23 Encounter for immunization: Secondary | ICD-10-CM | POA: Diagnosis not present

## 2023-04-08 DIAGNOSIS — Z7184 Encounter for health counseling related to travel: Secondary | ICD-10-CM | POA: Diagnosis not present

## 2023-04-17 ENCOUNTER — Telehealth (INDEPENDENT_AMBULATORY_CARE_PROVIDER_SITE_OTHER): Payer: Medicare HMO | Admitting: Family

## 2023-04-17 DIAGNOSIS — B029 Zoster without complications: Secondary | ICD-10-CM | POA: Diagnosis not present

## 2023-04-17 MED ORDER — VALACYCLOVIR HCL 1 G PO TABS: 1000.0000 mg | ORAL_TABLET | Freq: Three times a day (TID) | ORAL | 0 refills | Status: AC

## 2023-04-17 NOTE — Progress Notes (Signed)
MyChart Video Visit    Virtual Visit via Video Note    Patient location: Home. Patient and provider in visit Provider location: Office  I discussed the limitations of evaluation and management by telemedicine and the availability of in person appointments. The patient expressed understanding and agreed to proceed.  Visit Date: 04/17/2023  Today's healthcare provider: Lemont Fillers, NP     Subjective:    Patient ID: Stephen Evans, male    DOB: Jun 25, 1946, 77 y.o.   MRN: [id number]  Chief Complaint  Patient presents with   Rash    Facial rash for about 3 days    Rash    The patient presents with a new rash on the forehead, between the eyebrows, and on the side of the nose. The rash crosses the midline of the face and is slightly itchy. The patient has been applying calamine lotion for relief. Concurrently, the patient experienced tingling in the lips, a symptom Stephen Evans associates with previous herpes simplex outbreaks. Stephen Evans took one dose of his as-needed Valacyclovir prescription for this symptom. The patient also recently restarted Creon, a pancreatic enzyme supplement, but it is unclear if this is related to the rash. The patient received a Hepatitis A vaccine last week, but it is also unclear if this is related to the rash.   Past Medical History:  Diagnosis Date   Goals of care, counseling/discussion 05/28/2020   Melanoma (HCC) x 2   Melanoma (HCC)    Melanoma (HCC)    Pancreas cancer (HCC) 05/28/2020   Pancreatitis 06/2018   thought to be due to alcohol   Testicular cancer [facility]) 2011   testicular--radiation   Testicular tumor     Past Surgical History:  Procedure Laterality Date   BIOPSY  01/18/2020   Procedure: BIOPSY;  Surgeon: Lemar Lofty., MD;  Location: [facility];  Service: Gastroenterology;;   COLONOSCOPY  11/01/2003   ESOPHAGOGASTRODUODENOSCOPY (EGD) WITH PROPOFOL N/A 01/18/2020   Procedure: ESOPHAGOGASTRODUODENOSCOPY (EGD) WITH PROPOFOL;   Surgeon: Lemar Lofty., MD;  Location: [facility];  Service: Gastroenterology;  Laterality: N/A;   EUS N/A 01/18/2020   Procedure: UPPER ENDOSCOPIC ULTRASOUND (EUS) LINEAR;  Surgeon: Lemar Lofty., MD;  Location: [facility];  Service: Gastroenterology;  Laterality: N/A;   FINE NEEDLE ASPIRATION N/A 01/18/2020   Procedure: FINE NEEDLE ASPIRATION (FNA) LINEAR;  Surgeon: Lemar Lofty., MD;  Location: [facility];  Service: Gastroenterology;  Laterality: N/A;   LYMPH GLAND EXCISION Right 1979   axillary, benign   MELANOMA EXCISION Right 03/2014   elbow   ORCHIECTOMY Right 2001   followed by radiation   TONSILLECTOMY  1959   wide excision of melanoma on back  1979    Family History  Problem Relation Age of Onset   Epilepsy Mother    Esophageal cancer Father 50       smoking/drinking hx   Breast cancer Sister 68   Alcohol abuse Brother    Cancer Maternal Uncle        unknown type; d. late 33s   Testicular cancer Maternal Grandfather        unknown age   Colon cancer Neg Hx    Colon polyps Neg Hx    Rectal cancer Neg Hx    Stomach cancer Neg Hx    Pancreatic cancer Neg Hx     Social History   Socioeconomic History   Marital status: Married    Spouse name: Not on file   Number of  children: 2   Years of education: Not on file   Highest education level: Bachelor's degree (e.g., BA, AB, BS)  Occupational History   Occupation: retired    Comment: Associate Professor  Tobacco Use   Smoking status: Former    Current packs/day: 0.00    Types: Cigarettes    Start date: 09/07/1967    Quit date: 09/06/1997    Years since quitting: 25.6   Smokeless tobacco: Never  Vaping Use   Vaping status: Never Used  Substance and Sexual Activity   Alcohol use: Yes    Comment: 1 every 2 weeks   Drug use: No   Sexual activity: Not Currently  Other Topics Concern   Not on file  Social History Narrative   Consultant- Associate Professor.  Works as Conservator, museum/gallery for [employer]   Married- second marriage x 30 yrs   3 yr old son- lives in [place], [place] manages a private hedge fund   19 yr old daughter- lives in [place], [place]   Enjoys golf   Social Determinants of Health   Financial Resource Strain: Low Risk  (12/16/2022)   Overall Financial Resource Strain (CARDIA)    Difficulty of Paying Living Expenses: Not hard at all  Food Insecurity: No Food Insecurity (12/16/2022)   Hunger Vital Sign    Worried About Running Out of Food in the Last Year: Never true    Ran Out of Food in the Last Year: Never true  Transportation Needs: No Transportation Needs (12/16/2022)   PRAPARE - Administrator, Civil Service (Medical): No    Lack of Transportation (Non-Medical): No  Physical Activity: Sufficiently Active (12/16/2022)   Exercise Vital Sign    Days of Exercise per Week: 2 days    Minutes of Exercise per Session: 140 min  Stress: No Stress Concern Present (12/16/2022)   [employer] of Occupational Health - Occupational Stress Questionnaire    Feeling of Stress : Only a little  Social Connections: Moderately Integrated (12/16/2022)   Social Connection and Isolation Panel [NHANES]    Frequency of Communication with Friends and Family: Three times a week    Frequency of Social Gatherings with Friends and Family: Once a week    Attends Religious Services: Never    Database administrator or Organizations: Yes    Attends Engineer, structural: More than 4 times per year    Marital Status: Married  Catering manager Violence: Not on file    Outpatient Medications Prior to Visit  Medication Sig Dispense Refill   Multiple Vitamin (MULTIVITAMIN) tablet Take 1 tablet by mouth daily.     Testosterone 10 MG/ACT (2%) GEL Apply 1 application  topically daily.     valACYclovir (VALTREX) 1000 MG tablet Take 1,000 mg by mouth daily as needed (cold sores).     No facility-administered medications prior to visit.     Allergies  Allergen Reactions   Prednisone Other (See Comments)    pancreatitis     Review of Systems  Skin:  Positive for rash.       Objective:    Physical Exam Constitutional:      General: Stephen Evans is not in acute distress.    Appearance: Stephen Evans is well-developed.  HENT:     Head: Normocephalic and atraumatic.  Cardiovascular:     Rate and Rhythm: Normal rate and regular rhythm.     Heart sounds: No murmur heard. Pulmonary:  Effort: Pulmonary effort is normal. No respiratory distress.     Breath sounds: Normal breath sounds. No wheezing or rales.  Skin:    General: Skin is warm and dry.  Neurological:     Mental Status: Stephen Evans is alert and oriented to person, place, and time.  Psychiatric:        Behavior: Behavior normal.        Thought Content: Thought content normal.     There were no vitals taken for this visit. Wt Readings from Last 3 Encounters:  03/13/23 201 lb 1.9 oz (91.2 kg)  03/09/23 206 lb (93.4 kg)  12/17/22 205 lb (93 kg)    Gen: Awake, alert, no acute distress Resp: Breathing is even and non-labored Psych: calm/pleasant demeanor Neuro: Alert and Oriented x 3, + facial symmetry, speech is clear. Skin:  some vesicles noted on right nasal bridge, more erythematous ras noted right forehead    Assessment & Plan:   Problem List Items Addressed This Visit       Unprioritized   Herpes zoster without complication - Primary    New. Stephen Evans has not had Shingrix, we discussed that Stephen Evans should consider after his rash resolves. Will rx with course of valtrex. No current eye involvment. Stephen Evans will monitor and let us know if Stephen Evans develops a lesion on the tip of his nose or if his eye becomes irritated.      Relevant Medications   valACYclovir (VALTREX) 1000 MG tablet    I have changed Stephen Evans's valACYclovir. I am also having him maintain his multivitamin and Testosterone.  Meds ordered this encounter  Medications   valACYclovir (VALTREX) 1000 MG tablet     Sig: Take 1 tablet (1,000 mg total) by mouth 3 (three) times daily for 7 days.    Dispense:  21 tablet    Refill:  0    Order Specific Question:   Supervising Provider    Answer:   Danise Edge A [[id number]]    I discussed the assessment and treatment plan with the patient. The patient was provided an opportunity to ask questions and all were answered. The patient agreed with the plan and demonstrated an understanding of the instructions.   The patient was advised to call back or seek an in-person evaluation if the symptoms worsen or if the condition fails to improve as anticipated.    Lemont Fillers, NP [place]  [facility] at [facility] [phone] (phone) [phone] (fax)  [facility]

## 2023-04-17 NOTE — Assessment & Plan Note (Signed)
New. He has not had Shingrix, we discussed that he should consider after his rash resolves. Will rx with course of valtrex. No current eye involvment. He will monitor and let us know if he develops a lesion on the tip of his nose or if his eye becomes irritated.

## 2023-04-17 NOTE — Patient Instructions (Signed)
VISIT SUMMARY:  During your visit, we discussed the new rash on your forehead, between your eyebrows, and on the side of your nose. You also mentioned a tingling sensation in your lips, which you associate with previous cold sore outbreaks. We also discussed your recent use of Creon and the Hepatitis A vaccine you received last week.  YOUR PLAN:  -SHINGLES (HERPES ZOSTER): Shingles is a painful rash that develops on one side of the face or body. The rash forms blisters that typically scab over in 7 to 10 days and clears up within 2 to 4 weeks. We will continue your current medication (Valtrex) at an increased dose for 7 days to treat the shingles. We will check the rash in 7 days to see if you need more treatment. After this outbreak is over, we should consider getting you the Shingrix vaccine, which can help prevent future outbreaks.  -COLD SORES (HERPES SIMPLEX): Cold sores, also known as fever blisters, are a common viral infection. They are tiny, fluid-filled blisters on and around your lips. These blisters are often grouped together in patches. You should continue taking Valtrex as needed for these outbreaks.  INSTRUCTIONS:  Please continue taking your Valtrex medication as directed.  After this outbreak is over, we should consider getting you the Shingrix vaccine, which can help prevent future outbreaks of shingles.

## 2023-04-24 ENCOUNTER — Encounter: Payer: Self-pay | Admitting: Family

## 2023-05-01 ENCOUNTER — Other Ambulatory Visit (HOSPITAL_BASED_OUTPATIENT_CLINIC_OR_DEPARTMENT_OTHER): Payer: Self-pay

## 2023-05-01 MED ORDER — COVID-19 MRNA VAC-TRIS(PFIZER) 30 MCG/0.3ML IM SUSY
0.3000 mL | PREFILLED_SYRINGE | Freq: Once | INTRAMUSCULAR | 0 refills | Status: AC
  Filled 2023-05-01: qty 0.3, 1d supply, fill #0

## 2023-05-29 ENCOUNTER — Other Ambulatory Visit (HOSPITAL_COMMUNITY)
Admission: RE | Admit: 2023-05-29 | Discharge: 2023-05-29 | Disposition: A | Discharge status: Home / Self Care | Payer: Medicare HMO | Source: Ambulatory Visit | Attending: Oncology | Admitting: Oncology

## 2023-05-29 DIAGNOSIS — Z006 Encounter for examination for normal comparison and control in clinical research program: Secondary | ICD-10-CM | POA: Insufficient documentation

## 2023-06-06 LAB — HELIX MOLECULAR SCREEN: Genetic Analysis Overall Interpretation: NEGATIVE

## 2023-06-06 LAB — GENECONNECT MOLECULAR SCREEN

## 2023-07-13 ENCOUNTER — Encounter: Payer: Self-pay | Admitting: Hematology & Oncology

## 2023-07-24 ENCOUNTER — Encounter: Payer: Self-pay | Admitting: Hematology & Oncology

## 2023-07-31 ENCOUNTER — Ambulatory Visit: Payer: Medicare HMO | Admitting: Hematology & Oncology

## 2023-07-31 ENCOUNTER — Other Ambulatory Visit: Payer: Medicare HMO

## 2023-08-07 ENCOUNTER — Inpatient Hospital Stay: Payer: PPO | Attending: Hematology & Oncology

## 2023-08-07 ENCOUNTER — Encounter: Payer: Self-pay | Admitting: Hematology & Oncology

## 2023-08-07 ENCOUNTER — Inpatient Hospital Stay (HOSPITAL_BASED_OUTPATIENT_CLINIC_OR_DEPARTMENT_OTHER): Payer: PPO | Admitting: Hematology & Oncology

## 2023-08-07 VITALS — BP 129/75 | HR 64 | Temp 98.1°F | Resp 20 | Ht 67.0 in | Wt 213.1 lb

## 2023-08-07 DIAGNOSIS — C253 Malignant neoplasm of pancreatic duct: Secondary | ICD-10-CM | POA: Diagnosis not present

## 2023-08-07 DIAGNOSIS — C251 Malignant neoplasm of body of pancreas: Secondary | ICD-10-CM | POA: Diagnosis present

## 2023-08-07 LAB — LACTATE DEHYDROGENASE: LDH: 122 U/L (ref 98–192)

## 2023-08-07 LAB — CBC WITH DIFFERENTIAL (CANCER CENTER ONLY)
Abs Immature Granulocytes: 0.03 10*3/uL (ref 0.00–0.07)
Basophils Absolute: 0.1 10*3/uL (ref 0.0–0.1)
Basophils Relative: 1 %
Eosinophils Absolute: 0.3 10*3/uL (ref 0.0–0.5)
Eosinophils Relative: 4 %
HCT: 45.9 % (ref 39.0–52.0)
Hemoglobin: 15.4 g/dL (ref 13.0–17.0)
Immature Granulocytes: 0 %
Lymphocytes Relative: 23 %
Lymphs Abs: 2 10*3/uL (ref 0.7–4.0)
MCH: 30.9 pg (ref 26.0–34.0)
MCHC: 33.6 g/dL (ref 30.0–36.0)
MCV: 92 fL (ref 80.0–100.0)
Monocytes Absolute: 0.6 10*3/uL (ref 0.1–1.0)
Monocytes Relative: 6 %
Neutro Abs: 5.7 10*3/uL (ref 1.7–7.7)
Neutrophils Relative %: 66 %
Platelet Count: 231 10*3/uL (ref 150–400)
RBC: 4.99 MIL/uL (ref 4.22–5.81)
RDW: 12.2 % (ref 11.5–15.5)
WBC Count: 8.7 10*3/uL (ref 4.0–10.5)
nRBC: 0 % (ref 0.0–0.2)

## 2023-08-07 LAB — CMP (CANCER CENTER ONLY)
ALT: 18 U/L (ref 0–44)
AST: 16 U/L (ref 15–41)
Albumin: 4.1 g/dL (ref 3.5–5.0)
Alkaline Phosphatase: 77 U/L (ref 38–126)
Anion gap: 12 (ref 5–15)
BUN: 17 mg/dL (ref 8–23)
CO2: 24 mmol/L (ref 22–32)
Calcium: 9.2 mg/dL (ref 8.9–10.3)
Chloride: 105 mmol/L (ref 98–111)
Creatinine: 0.97 mg/dL (ref 0.61–1.24)
GFR, Estimated: >60 mL/min (ref >60)
Glucose, Bld: 111 mg/dL — ABNORMAL HIGH (ref 70–99)
Potassium: 4.2 mmol/L (ref 3.5–5.1)
Sodium: 141 mmol/L (ref 135–145)
Total Bilirubin: 0.5 mg/dL (ref 0.0–1.2)
Total Protein: 7.3 g/dL (ref 6.5–8.1)

## 2023-08-07 LAB — LIPASE, BLOOD: Lipase: 52 U/L — ABNORMAL HIGH (ref 11–51)

## 2023-08-07 NOTE — Progress Notes (Signed)
 Hematology and Oncology Follow Up Visit  Stephen Evans [id number] 03/06/1946 78 y.o. 08/07/2023   Principle Diagnosis:  Stage Ia (T1cN0M0) adenocarcinoma of the pancreas-pancreas body  Current Therapy:   Status post Whipple procedure on 04/11/2020     Interim History:  Stephen Evans is back for follow-up.  He looks quite good.  He and his wife had a wonderful cruise down the [place].  This was back in November.  I saw a lot of pictures.  They really enjoyed themselves.  His wife had right knee surgery about 3 weeks ago.  She is doing well.  She is taking physical therapy.  He did have a bout of abdominal pain I think back in the Summer.  This seemed to resolve.  He wants to be on Creon  now.  It was recommended that he be on Creon  from the doctors at [facility].  We will go ahead and make sure that we manage this for him.  He has had no problems with cough.  There is been no problems with fever.  He has had no bleeding.  He has had no obvious change in bowel or bladder habits.  His last CA 19-9 back in August was 52.  He has had no rashes.  He had has low bit of what looks like eczema around his chin.  I told him to try some hydrocortisone.  Overall, I would say his performance status is probably ECOG 1.    Medications:  Current Outpatient Medications:    CREON  36000-114000 units CPEP capsule, Take 36,000 Units by mouth. 08/07/2023 May take up to 7 times per day., Disp: , Rfl:    Multiple Vitamin (MULTIVITAMIN) tablet, Take 1 tablet by mouth daily., Disp: , Rfl:    tamsulosin (FLOMAX) 0.4 MG CAPS capsule, Take by mouth daily., Disp: , Rfl:    Testosterone  10 MG/ACT (2%) GEL, Apply 1 application  topically daily., Disp: , Rfl:   Allergies:  Allergies  Allergen Reactions   Prednisone  Other (See Comments)    pancreatitis     Past Medical History, Surgical history, Social history, and Family History were reviewed and updated.  Review of Systems: Review of Systems  Constitutional:  Negative.   HENT:  Negative.    Eyes: Negative.   Respiratory: Negative.    Cardiovascular: Negative.   Gastrointestinal:  Positive for abdominal pain.  Endocrine: Negative.   Genitourinary: Negative.    Musculoskeletal: Negative.   Skin: Negative.   Neurological: Negative.   Hematological: Negative.   Psychiatric/Behavioral: Negative.      Physical Exam:  height is 5' 7 (1.702 m) and weight is 213 lb 1.9 oz (96.7 kg). His oral temperature is 98.1 F (36.7 C). His blood pressure is 129/75 and his pulse is 64. His respiration is 20 and oxygen saturation is 98%.   Wt Readings from Last 3 Encounters:  08/07/23 213 lb 1.9 oz (96.7 kg)  03/13/23 201 lb 1.9 oz (91.2 kg)  03/09/23 206 lb (93.4 kg)    Physical Exam Vitals reviewed.  HENT:     Head: Normocephalic and atraumatic.  Eyes:     Pupils: Pupils are equal, round, and reactive to light.  Cardiovascular:     Rate and Rhythm: Normal rate and regular rhythm.     Heart sounds: Normal heart sounds.  Pulmonary:     Effort: Pulmonary effort is normal.     Breath sounds: Normal breath sounds.  Abdominal:     General: Bowel sounds are normal.  Palpations: Abdomen is soft.     Comments: Abdominal exam shows a soft abdomen.  Bowel sounds are present.  Has well healed laparotomy scar in the midline.  He does not have a keloid formation.  There is no fluid wave.  He has little bit of tenderness throughout the abdomen to palpation.  He has no rebound tenderness.  There is no obvious fluid wave.  I cannot palpate his liver or spleen tip.  Musculoskeletal:        General: No tenderness or deformity. Normal range of motion.     Cervical back: Normal range of motion.  Lymphadenopathy:     Cervical: No cervical adenopathy.  Skin:    General: Skin is warm and dry.     Findings: No erythema or rash.  Neurological:     Mental Status: He is alert and oriented to person, place, and time.  Psychiatric:        Behavior: Behavior normal.         Thought Content: Thought content normal.        Judgment: Judgment normal.     Lab Results  Component Value Date   WBC 8.7 08/07/2023   HGB 15.4 08/07/2023   HCT 45.9 08/07/2023   MCV 92.0 08/07/2023   PLT 231 08/07/2023     Chemistry      Component Value Date/Time   NA 141 08/07/2023 1205   K 4.2 08/07/2023 1205   CL 105 08/07/2023 1205   CO2 24 08/07/2023 1205   BUN 17 08/07/2023 1205   CREATININE 0.97 08/07/2023 1205   CREATININE 0.90 05/21/2018 1606      Component Value Date/Time   CALCIUM 9.2 08/07/2023 1205   ALKPHOS 77 08/07/2023 1205   AST 16 08/07/2023 1205   ALT 18 08/07/2023 1205   BILITOT 0.5 08/07/2023 1205      Impression and Plan: Stephen Evans is a very nice 72 year old white male.  Again he is very interesting to talk to.  He is originally from [place] .  He worked for [employer].  He was in the [employer].  From my point of view, everything looks quite good.  I am very happy for him.  He is going to have an MRI at [facility] I think in February.  I am glad that we can get him back on to Creon .  I think that is the next cruise is going to take probably will be in the Spring or maybe Summer when they go to [place].  I would like to probably see him back in 3 or 4 months.  Maude JONELLE Crease, MD 1/10/20251:25 PM

## 2023-08-09 LAB — CANCER ANTIGEN 19-9: CA 19-9: 26 U/mL (ref 0–35)

## 2023-08-10 ENCOUNTER — Encounter: Payer: Self-pay | Admitting: *Deleted

## 2023-08-30 ENCOUNTER — Encounter: Payer: Self-pay | Admitting: Hematology & Oncology

## 2023-09-07 ENCOUNTER — Telehealth: Payer: Self-pay

## 2023-09-07 NOTE — Telephone Encounter (Signed)
 Initial Comment Caller states he made appt. and was transferred to us  from [place] to speak to nurse. He doesn't know why he got transferred to us . Translation No Nurse Assessment Nurse: Titus Formosa, RN, Aleen Ammons Date/Time (Eastern Time): 09/07/2023 9:13:45 AM Confirm and document reason for call. If symptomatic, describe symptoms. ---caller states last thursday he started not feeling well with abdominal pain, dizziness, head pressure, alot of gas. Abdominal pain is zero now. States he is having pressure on his head and neck and was dizzy when he got up. Does the patient have any new or worsening symptoms? ---Yes Will a triage be completed? ---Yes Related visit to physician within the last 2 weeks? ---No Does the PT have any chronic conditions? (i.e. diabetes, asthma, this includes High risk factors for pregnancy, etc.) ---Yes List chronic conditions. ---whipple, hx of pancreatic cancer Is this a behavioral health or substance abuse call? ---No Guidelines Guideline Title Affirmed Question Affirmed Notes Nurse Date/Time (Eastern Time) Dizziness - Lightheadedness [1] MODERATE dizziness (e.g., interferes with normal activities) AND [2] has NOT been evaluated by doctor (or NP/PA) for this (Exception: [place], RN, Aleen Ammons 09/07/2023 9:18:00 AM PLEASE NOTE: All timestamps contained within this report are represented as [place] Standard Time. CONFIDENTIALTY NOTICE: This fax transmission is intended only for the addressee. It contains information that is legally privileged, confidential or otherwise protected from use or disclosure. If you are not the intended recipient, you are strictly prohibited from reviewing, disclosing, copying using or disseminating any of this information or taking any action in reliance on or regarding this information. If you have received this fax in error, please notify us  immediately by telephone so that we can arrange for its return to us . Phone: [phone],  Toll-Free: [phone], Fax: [phone] Page: 2 of 2 Call Id: [id number] Guidelines Guideline Title Affirmed Question Affirmed Notes Nurse Date/Time [facility] Time) Dizziness caused by heat exposure, sudden standing, or poor fluid intake.) Disp. Time [facility] Time) Disposition Final User 09/07/2023 9:21:23 AM See PCP within 24 Hours Yes Titus Formosa, RN, Aleen Ammons Final Disposition 09/07/2023 9:21:23 AM See PCP within 24 Hours Yes Titus Formosa, RN, Judene Noss Disagree/Comply Comply Caller Understands Yes PreDisposition Home Care Care Advice Given Per Guideline * IF OFFICE WILL BE OPEN: You need to be examined within the next 24 hours. Call your doctor (or NP/PA) when the office opens and make an appointment. SEE PCP WITHIN 24 HOURS: DRINK FLUIDS: * Drink several glasses of fruit juice, other clear fluids or water. * This will improve hydration and blood sugar levels. * If the weather is hot or you have a fever, make sure the fluids are cold. LIE DOWN AND REST: * Lie down with feet elevated for 1 hour. * This will improve blood flow through the body and to the brain. CALL BACK IF: * You pass out (faint) or are too weak to stand * You become worse CARE ADVICE given per Dizziness - Lightheadedness (Adult) guideline. Comments User: Winnie Haver, RN Date/Time [facility] Time): 09/07/2023 9:18:35 AM has appointment for tomorrow at 0920 Referrals REFERRED TO PCP OFFICE

## 2023-09-07 NOTE — Telephone Encounter (Signed)
Appt scheduled tomorrow w/ PCP.  ?

## 2023-09-08 ENCOUNTER — Ambulatory Visit: Payer: PPO | Admitting: Family

## 2023-11-06 ENCOUNTER — Inpatient Hospital Stay: Payer: Self-pay | Attending: Hematology & Oncology | Admitting: *Deleted

## 2023-11-06 ENCOUNTER — Other Ambulatory Visit: Payer: Self-pay

## 2023-11-06 ENCOUNTER — Inpatient Hospital Stay: Payer: Self-pay | Admitting: Hematology & Oncology

## 2023-11-06 ENCOUNTER — Encounter: Payer: Self-pay | Admitting: Hematology & Oncology

## 2023-11-06 VITALS — BP 110/67 | HR 74 | Temp 97.9°F | Resp 18 | Ht 67.0 in | Wt 212.0 lb

## 2023-11-06 DIAGNOSIS — Z8507 Personal history of malignant neoplasm of pancreas: Secondary | ICD-10-CM | POA: Insufficient documentation

## 2023-11-06 DIAGNOSIS — E291 Testicular hypofunction: Secondary | ICD-10-CM

## 2023-11-06 DIAGNOSIS — C251 Malignant neoplasm of body of pancreas: Secondary | ICD-10-CM

## 2023-11-06 DIAGNOSIS — R5383 Other fatigue: Secondary | ICD-10-CM

## 2023-11-06 DIAGNOSIS — C253 Malignant neoplasm of pancreatic duct: Secondary | ICD-10-CM

## 2023-11-06 LAB — CMP (CANCER CENTER ONLY)
ALT: 12 U/L (ref 0–44)
AST: 9 U/L — ABNORMAL LOW (ref 15–41)
Albumin: 4.2 g/dL (ref 3.5–5.0)
Alkaline Phosphatase: 84 U/L (ref 38–126)
Anion gap: 10 (ref 5–15)
BUN: 15 mg/dL (ref 8–23)
CO2: 28 mmol/L (ref 22–32)
Calcium: 9.4 mg/dL (ref 8.9–10.3)
Chloride: 107 mmol/L (ref 98–111)
Creatinine: 0.96 mg/dL (ref 0.61–1.24)
GFR, Estimated: >60 mL/min (ref >60)
Glucose, Bld: 119 mg/dL — ABNORMAL HIGH (ref 70–99)
Potassium: 4.3 mmol/L (ref 3.5–5.1)
Sodium: 145 mmol/L (ref 135–145)
Total Bilirubin: 0.7 mg/dL (ref 0.0–1.2)
Total Protein: 6.9 g/dL (ref 6.5–8.1)

## 2023-11-06 LAB — CBC WITH DIFFERENTIAL (CANCER CENTER ONLY)
Abs Immature Granulocytes: 0.02 10*3/uL (ref 0.00–0.07)
Basophils Absolute: 0.1 10*3/uL (ref 0.0–0.1)
Basophils Relative: 1 %
Eosinophils Absolute: 0.3 10*3/uL (ref 0.0–0.5)
Eosinophils Relative: 6 %
HCT: 45.3 % (ref 39.0–52.0)
Hemoglobin: 15.1 g/dL (ref 13.0–17.0)
Immature Granulocytes: 0 %
Lymphocytes Relative: 27 %
Lymphs Abs: 1.6 10*3/uL (ref 0.7–4.0)
MCH: 30.4 pg (ref 26.0–34.0)
MCHC: 33.3 g/dL (ref 30.0–36.0)
MCV: 91.1 fL (ref 80.0–100.0)
Monocytes Absolute: 0.4 10*3/uL (ref 0.1–1.0)
Monocytes Relative: 6 %
Neutro Abs: 3.6 10*3/uL (ref 1.7–7.7)
Neutrophils Relative %: 60 %
Platelet Count: 215 10*3/uL (ref 150–400)
RBC: 4.97 MIL/uL (ref 4.22–5.81)
RDW: 12.7 % (ref 11.5–15.5)
WBC Count: 6 10*3/uL (ref 4.0–10.5)
nRBC: 0 % (ref 0.0–0.2)

## 2023-11-06 LAB — LIPASE, BLOOD: Lipase: 32 U/L (ref 11–51)

## 2023-11-06 NOTE — Progress Notes (Signed)
 Hematology and Oncology Follow Up Visit  Stephen Evans [id number] Apr 12, 1946 78 y.o. 11/06/2023   Principle Diagnosis:  Stage Ia (T1cN0M0) adenocarcinoma of the pancreas-pancreas body  Current Therapy:   Status post Whipple procedure on 04/11/2020     Interim History:  Stephen Evans is back for follow-up.  Sounds like he may be having problems with the Creon.  He says he just does not feel that well when he takes Creon.  He has not been sleeping all that well.  To me, it sounds like sleep apnea might be a problem.  I know this has been brought up to him in the past.  He has been a little very reluctant to do any kind of sleep study.  He did have MRI and CT scans done at [facility] in February.  Everything looks fine without any evidence of recurrent disease.  His last CA 19-9 was 26.  As always, he and his wife are going to travel.  I think they are going to go off to [place].  He has had no issues with fever.  There has been no obvious problems with bleeding.  He has had no change in bowel or bladder habits.  Overall, his performance status is probably ECOG 1.    Medications:  Current Outpatient Medications:    valACYclovir (VALTREX) 1000 MG tablet, Take 1,000 mg by mouth 2 (two) times daily., Disp: , Rfl:    CREON 36000-114000 units CPEP capsule, Take 36,000 Units by mouth. 08/07/2023 May take up to 7 times per day., Disp: , Rfl:    Multiple Vitamin (MULTIVITAMIN) tablet, Take 1 tablet by mouth daily., Disp: , Rfl:    tamsulosin (FLOMAX) 0.4 MG CAPS capsule, Take by mouth daily., Disp: , Rfl:    Testosterone 10 MG/ACT (2%) GEL, Apply 1 application  topically daily., Disp: , Rfl:   Allergies:  Allergies  Allergen Reactions   Prednisone Other (See Comments)    pancreatitis     Past Medical History, Surgical history, Social history, and Family History were reviewed and updated.  Review of Systems: Review of Systems  Constitutional: Negative.   HENT:  Negative.     Eyes: Negative.   Respiratory: Negative.    Cardiovascular: Negative.   Gastrointestinal:  Positive for abdominal pain.  Endocrine: Negative.   Genitourinary: Negative.    Musculoskeletal: Negative.   Skin: Negative.   Neurological: Negative.   Hematological: Negative.   Psychiatric/Behavioral: Negative.      Physical Exam:  height is 5\' 7"  (1.702 m) and weight is 212 lb (96.2 kg). His oral temperature is 97.9 F (36.6 C). His blood pressure is 110/67 and his pulse is 74. His respiration is 18 and oxygen saturation is 96%.   Wt Readings from Last 3 Encounters:  11/06/23 212 lb (96.2 kg)  08/07/23 213 lb 1.9 oz (96.7 kg)  03/13/23 201 lb 1.9 oz (91.2 kg)    Physical Exam Vitals reviewed.  HENT:     Head: Normocephalic and atraumatic.  Eyes:     Pupils: Pupils are equal, round, and reactive to light.  Cardiovascular:     Rate and Rhythm: Normal rate and regular rhythm.     Heart sounds: Normal heart sounds.  Pulmonary:     Effort: Pulmonary effort is normal.     Breath sounds: Normal breath sounds.  Abdominal:     General: Bowel sounds are normal.     Palpations: Abdomen is soft.     Comments: Abdominal exam shows  a soft abdomen.  Bowel sounds are present.  Has well healed laparotomy scar in the midline.  He does not have a keloid formation.  There is no fluid wave.  He has little bit of tenderness throughout the abdomen to palpation.  He has no rebound tenderness.  There is no obvious fluid wave.  I cannot palpate his liver or spleen tip.  Musculoskeletal:        General: No tenderness or deformity. Normal range of motion.     Cervical back: Normal range of motion.  Lymphadenopathy:     Cervical: No cervical adenopathy.  Skin:    General: Skin is warm and dry.     Findings: No erythema or rash.  Neurological:     Mental Status: He is alert and oriented to person, place, and time.  Psychiatric:        Behavior: Behavior normal.        Thought Content: Thought  content normal.        Judgment: Judgment normal.     Lab Results  Component Value Date   WBC 6.0 11/06/2023   HGB 15.1 11/06/2023   HCT 45.3 11/06/2023   MCV 91.1 11/06/2023   PLT 215 11/06/2023     Chemistry      Component Value Date/Time   NA 145 11/06/2023 0854   K 4.3 11/06/2023 0854   CL 107 11/06/2023 0854   CO2 28 11/06/2023 0854   BUN 15 11/06/2023 0854   CREATININE 0.96 11/06/2023 0854   CREATININE 0.90 05/21/2018 1606      Component Value Date/Time   CALCIUM 9.4 11/06/2023 0854   ALKPHOS 84 11/06/2023 0854   AST 9 (L) 11/06/2023 0854   ALT 12 11/06/2023 0854   BILITOT 0.7 11/06/2023 0854      Impression and Plan: Stephen Evans is a very nice 89 year old white male.  Again he is very interesting to talk to.  He is originally from [place].  He worked for [employer].  He was in the [employer].  I told him that he could certainly stop the Creon.  If you wanted to he could decrease the Creon to 1 capsule before meals.  I will certainly leave it up to his discretion.  He is going to have additional scans at [facility] in I think July.  I would like to see him back after he has these scans done.  I do think a sleep study would not be a bad idea for him.  Again whether or not he will do this is a matter of personal preference.  I would like to see him back probably close to the end of Summer.   Josph Macho, MD 4/11/20259:50 AM

## 2023-11-07 LAB — CANCER ANTIGEN 19-9: CA 19-9: 24 U/mL (ref 0–35)

## 2023-11-27 ENCOUNTER — Other Ambulatory Visit (HOSPITAL_BASED_OUTPATIENT_CLINIC_OR_DEPARTMENT_OTHER): Payer: Self-pay

## 2023-11-27 ENCOUNTER — Encounter: Payer: Self-pay | Admitting: Family

## 2023-11-27 MED ORDER — AMOXICILLIN-POT CLAVULANATE 875-125 MG PO TABS
1.0000 | ORAL_TABLET | Freq: Two times a day (BID) | ORAL | 0 refills | Status: DC
  Filled 2023-11-27: qty 14, 7d supply, fill #0

## 2023-11-27 NOTE — Telephone Encounter (Signed)

## 2023-12-09 ENCOUNTER — Other Ambulatory Visit (HOSPITAL_BASED_OUTPATIENT_CLINIC_OR_DEPARTMENT_OTHER): Payer: Self-pay

## 2023-12-09 ENCOUNTER — Ambulatory Visit (INDEPENDENT_AMBULATORY_CARE_PROVIDER_SITE_OTHER): Admitting: Family

## 2023-12-09 VITALS — BP 138/75 | HR 74 | Temp 98.5°F | Resp 16 | Ht 67.0 in | Wt 211.0 lb

## 2023-12-09 DIAGNOSIS — J309 Allergic rhinitis, unspecified: Secondary | ICD-10-CM | POA: Insufficient documentation

## 2023-12-09 MED ORDER — MONTELUKAST SODIUM 10 MG PO TABS: 10.0000 mg | ORAL_TABLET | Freq: Every day | ORAL | 0 refills | Status: DC

## 2023-12-09 MED ORDER — MONTELUKAST SODIUM 10 MG PO TABS
10.0000 mg | ORAL_TABLET | Freq: Every day | ORAL | 0 refills | Status: DC
  Filled 2023-12-09 (×2): qty 90, 90d supply, fill #0

## 2023-12-09 NOTE — Progress Notes (Signed)
 Subjective:     Patient ID: Stephen Evans, male    DOB: June 21, 1946, 78 y.o.   MRN: [id number]  Chief Complaint  Patient presents with   Sinus Problem    Patient complains of sinus congestion on and off for 7 weeks.    Sinus Problem    Discussed the use of AI scribe software for clinical note transcription with the patient, who gave verbal consent to proceed.  History of Present Illness   Stephen Evans is a 78 year old male who presents with persistent nasal congestion.  He has experienced persistent nasal congestion since early April, initially thought to be due to pollen. Claritin and a course of antibiotics have not alleviated his symptoms. During a trip to [place]  in late April, he experienced significant discomfort and congestion, with minimal relief from over-the-counter Sudafed. Since returning, he has used Flonase and continues with Sudafed as needed, but congestion persists.  His congestion is intermittent, with complete nasal obstruction at times, followed by spontaneous relief. There is no consistent nasal discharge, but occasionally thick yellow-green mucus is expelled upon waking. He denies using Afrin.  Congestion affects his sleep, causing snoring and necessitating sleeping in a separate bedroom. He feels run down due to the persistent congestion. He has not previously experienced allergy problems despite living in [place]  for twelve years. No facial pain is reported.    Health Maintenance Due  Topic Date Due   Hepatitis C Screening  Never done   Zoster Vaccines- Shingrix  (1 of 2) Never done   Medicare Annual Wellness (AWV)  08/17/2015   COVID-19 Vaccine (7 - Pfizer risk 2024-25 season) 10/30/2023    Past Medical History:  Diagnosis Date   Goals of care, counseling/discussion 05/28/2020   Melanoma (HCC) x 2   Melanoma (HCC)    Melanoma (HCC)    Pancreas cancer (HCC) 05/28/2020   Pancreatitis 06/2018   thought to be due to alcohol   Testicular  cancer [facility]) 2011   testicular--radiation   Testicular tumor     Past Surgical History:  Procedure Laterality Date   BIOPSY  01/18/2020   Procedure: BIOPSY;  Surgeon: Normie Becton., MD;  Location: [facility];  Service: Gastroenterology;;   COLONOSCOPY  11/01/2003   ESOPHAGOGASTRODUODENOSCOPY (EGD) WITH PROPOFOL  N/A 01/18/2020   Procedure: ESOPHAGOGASTRODUODENOSCOPY (EGD) WITH PROPOFOL ;  Surgeon: Normie Becton., MD;  Location: [facility];  Service: Gastroenterology;  Laterality: N/A;   EUS N/A 01/18/2020   Procedure: UPPER ENDOSCOPIC ULTRASOUND (EUS) LINEAR;  Surgeon: Normie Becton., MD;  Location: [facility];  Service: Gastroenterology;  Laterality: N/A;   FINE NEEDLE ASPIRATION N/A 01/18/2020   Procedure: FINE NEEDLE ASPIRATION (FNA) LINEAR;  Surgeon: Normie Becton., MD;  Location: [facility];  Service: Gastroenterology;  Laterality: N/A;   LYMPH GLAND EXCISION Right 1979   axillary, benign   MELANOMA EXCISION Right 03/2014   elbow   ORCHIECTOMY Right 2001   followed by radiation   TONSILLECTOMY  1959   wide excision of melanoma on back  1979    Family History  Problem Relation Age of Onset   Epilepsy Mother    Esophageal cancer Father 32       smoking/drinking hx   Breast cancer Sister 19   Alcohol abuse Brother    Cancer Maternal Uncle        unknown type; d. late 10s   Testicular cancer Maternal Grandfather        unknown age   Colon  cancer Neg Hx    Colon polyps Neg Hx    Rectal cancer Neg Hx    Stomach cancer Neg Hx    Pancreatic cancer Neg Hx     Social History   Socioeconomic History   Marital status: Married    Spouse name: Not on file   Number of children: 2   Years of education: Not on file   Highest education level: Bachelor's degree (e.g., BA, AB, BS)  Occupational History   Occupation: retired    Comment: Associate Professor  Tobacco Use   Smoking status: Former    Current packs/day: 0.00    Types:  Cigarettes    Start date: 09/07/1967    Quit date: 09/06/1997    Years since quitting: 26.2   Smokeless tobacco: Never  Vaping Use   Vaping status: Never Used  Substance and Sexual Activity   Alcohol use: Yes    Comment: 1 every 2 weeks   Drug use: No   Sexual activity: Not Currently  Other Topics Concern   Not on file  Social History Narrative   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 [employer]   Married- second marriage x 30 yrs   31 yr old son- lives in [place], [place] manages a private hedge fund   33 yr old daughter- lives in [place], [place]   Enjoys golf   Social Drivers of Health   Financial Resource Strain: Low Risk  (09/06/2023)   Overall Financial Resource Strain (CARDIA)    Difficulty of Paying Living Expenses: Not hard at all  Food Insecurity: No Food Insecurity (09/06/2023)   Hunger Vital Sign    Worried About Running Out of Food in the Last Year: Never true    Ran Out of Food in the Last Year: Never true  Transportation Needs: No Transportation Needs (09/06/2023)   PRAPARE - Administrator, Civil Service (Medical): No    Lack of Transportation (Non-Medical): No  Physical Activity: Sufficiently Active (09/06/2023)   Exercise Vital Sign    Days of Exercise per Week: 3 days    Minutes of Exercise per Session: 90 min  Stress: No Stress Concern Present (09/06/2023)   [employer] of Occupational Health - Occupational Stress Questionnaire    Feeling of Stress : Only a little  Social Connections: Unknown (09/06/2023)   Social Connection and Isolation Panel [NHANES]    Frequency of Communication with Friends and Family: Twice a week    Frequency of Social Gatherings with Friends and Family: Twice a week    Attends Religious Services: Patient declined    Database administrator or Organizations: No    Attends Engineer, structural: More than 4 times per year    Marital Status: Married  Catering manager  Violence: Not on file    Outpatient Medications Prior to Visit  Medication Sig Dispense Refill   CREON  36000-114000 units CPEP capsule Take 36,000 Units by mouth. 08/07/2023 May take up to 7 times per day.     Multiple Vitamin (MULTIVITAMIN) tablet Take 1 tablet by mouth daily.     tamsulosin (FLOMAX) 0.4 MG CAPS capsule Take by mouth daily.     Testosterone  10 MG/ACT (2%) GEL Apply 1 application  topically daily.     valACYclovir  (VALTREX ) 1000 MG tablet Take 1,000 mg by mouth 2 (two) times daily.     amoxicillin -clavulanate (AUGMENTIN ) 875-125 MG tablet Take 1 tablet by mouth 2 (two) times  daily. 14 tablet 0   No facility-administered medications prior to visit.    Allergies  Allergen Reactions   Prednisone  Other (See Comments)    pancreatitis     ROS     Objective:     Physical Exam Constitutional:      General: He is not in acute distress.    Appearance: He is well-developed.  HENT:     Head: Normocephalic and atraumatic.     Right Ear: Tympanic membrane and ear canal normal.     Left Ear: Tympanic membrane and ear canal normal.     Nose: No nasal deformity or septal deviation.     Right Nostril: No foreign body.     Left Nostril: No foreign body.     Right Turbinates: Not enlarged.     Left Turbinates: Not enlarged.  Cardiovascular:     Rate and Rhythm: Normal rate and regular rhythm.     Heart sounds: No murmur heard. Pulmonary:     Effort: Pulmonary effort is normal. No respiratory distress.     Breath sounds: Normal breath sounds. No wheezing or rales.  Skin:    General: Skin is warm and dry.  Neurological:     Mental Status: He is alert and oriented to person, place, and time.  Psychiatric:        Behavior: Behavior normal.        Thought Content: Thought content normal.      BP 138/75 (BP Location: Right Arm, Patient Position: Sitting, Cuff Size: Normal)   Pulse 74   Temp 98.5 F (36.9 C) (Oral)   Resp 16   Ht 5\' 7"  (1.702 m)   Wt 211 lb (95.7  kg)   SpO2 100%   BMI 33.05 kg/m  Wt Readings from Last 3 Encounters:  12/09/23 211 lb (95.7 kg)  11/06/23 212 lb (96.2 kg)  08/07/23 213 lb 1.9 oz (96.7 kg)       Assessment & Plan:   Problem List Items Addressed This Visit       Unprioritized   Allergic rhinitis - Primary    Persistent nasal congestion unresponsive to current treatment. Differential includes nasal polyps or swollen turbinates. ENT referral recommended for further evaluation, possible nasal scope, and CT scan. - Refer to ENT for evaluation of nasal congestion and possible nasal polyps. - Continue Flonase, two sprays each nostril once daily or one spray twice daily. - Use Sudafed on bad days, avoid daily use due to potential blood pressure elevation. - Prescribe Singulair to use with Flonase and Claritin.      Relevant Medications   montelukast (SINGULAIR) 10 MG tablet   Other Relevant Orders   Ambulatory referral to ENT  Singulair rx cancelled at [facility] and sent to [facility].  I have discontinued Jadan K. Klosowski's amoxicillin -clavulanate. I am also having him maintain his multivitamin, Testosterone , Creon , tamsulosin, valACYclovir , and montelukast.  Meds ordered this encounter  Medications   DISCONTD: montelukast (SINGULAIR) 10 MG tablet    Sig: Take 1 tablet (10 mg total) by mouth at bedtime.    Dispense:  90 tablet    Refill:  0    Supervising Provider:   Randie Bustle A [[id number]]   montelukast (SINGULAIR) 10 MG tablet    Sig: Take 1 tablet (10 mg total) by mouth at bedtime.    Dispense:  90 tablet    Refill:  0    Supervising Provider:   Randie Bustle A [[id number]]

## 2023-12-09 NOTE — Assessment & Plan Note (Signed)
  Persistent nasal congestion unresponsive to current treatment. Differential includes nasal polyps or swollen turbinates. ENT referral recommended for further evaluation, possible nasal scope, and CT scan. - Refer to ENT for evaluation of nasal congestion and possible nasal polyps. - Continue Flonase, two sprays each nostril once daily or one spray twice daily. - Use Sudafed on bad days, avoid daily use due to potential blood pressure elevation. - Prescribe Singulair to use with Flonase and Claritin.

## 2023-12-09 NOTE — Patient Instructions (Signed)
 VISIT SUMMARY:  During your visit, we discussed your persistent nasal congestion and possible allergic rhinitis. We also talked about preventing shingles with a vaccine.  YOUR PLAN:  NASAL CONGESTION: You have been experiencing persistent nasal congestion that has not improved with your current treatments. -We will refer you to an ENT specialist for further evaluation, which may include a nasal scope and a CT scan to check for nasal polyps. -Continue using Flonase, two sprays in each nostril once daily or one spray twice daily. -Use Sudafed only on bad days, but avoid daily use as it can raise your blood pressure. -We will prescribe Singulair to use along with Flonase and Claritin.  ALLERGIC RHINITIS: Your nasal congestion may be due to allergic rhinitis, likely worsened by spring pollen. -Continue taking Claritin 10 mg daily. -Continue use of Sudafed sparingly for decongestion.  SHINGLES PREVENTION: We discussed the shingles vaccine, which can reduce your risk of shingles by 90%. -Get the shingles vaccination at your pharmacy, where it can be processed through your insurance.

## 2023-12-16 DIAGNOSIS — J329 Chronic sinusitis, unspecified: Secondary | ICD-10-CM | POA: Diagnosis not present

## 2023-12-16 DIAGNOSIS — J339 Nasal polyp, unspecified: Secondary | ICD-10-CM | POA: Diagnosis not present

## 2023-12-16 DIAGNOSIS — J342 Deviated nasal septum: Secondary | ICD-10-CM | POA: Diagnosis not present

## 2024-01-12 ENCOUNTER — Other Ambulatory Visit (HOSPITAL_BASED_OUTPATIENT_CLINIC_OR_DEPARTMENT_OTHER): Payer: Self-pay

## 2024-01-12 ENCOUNTER — Ambulatory Visit (INDEPENDENT_AMBULATORY_CARE_PROVIDER_SITE_OTHER): Admitting: Family

## 2024-01-12 VITALS — BP 105/65 | HR 73 | Temp 98.4°F | Resp 16 | Ht 67.0 in | Wt 209.0 lb

## 2024-01-12 DIAGNOSIS — H109 Unspecified conjunctivitis: Secondary | ICD-10-CM | POA: Insufficient documentation

## 2024-01-12 DIAGNOSIS — J309 Allergic rhinitis, unspecified: Secondary | ICD-10-CM | POA: Diagnosis not present

## 2024-01-12 MED ORDER — LORATADINE 10 MG PO TABS: 10.0000 mg | ORAL_TABLET | Freq: Every day | ORAL | Status: DC

## 2024-01-12 MED ORDER — NEOMYCIN-POLYMYXIN-DEXAMETH 3.5-10000-0.1 OP SUSP
1.0000 [drp] | Freq: Four times a day (QID) | OPHTHALMIC | 0 refills | Status: AC
  Filled 2024-01-12: qty 5, 25d supply, fill #0

## 2024-01-12 MED ORDER — RYALTRIS 665-25 MCG/ACT NA SUSP
2.0000 | Freq: Two times a day (BID) | NASAL | Status: DC
Start: 2024-01-12 — End: 2024-03-04

## 2024-01-12 NOTE — Assessment & Plan Note (Signed)
  Chronic nasal congestion with nasal polyp, previously evaluated by ENT. Prednisone  ineffective as prescribed by ENT. - Continue Ryaltris nasal spray as prescribed. - Add Claritin for allergy relief. - No improvement noted with singulair  so patient discontinued. - Continue Mucinex as he has found some relief with this. - Follow up with ENT for MRI of sinuses as scheduled.

## 2024-01-12 NOTE — Assessment & Plan Note (Addendum)
  Symptoms suggest allergic conjunctivitis, but bacterial conjunctivitis cannot be excluded without treatment response. - Prescribe antibiotic eye drops for 5 days. - If no improvement, recommend OTC allergy eye drops. - Advise Aquaphor to eyelids for irritation relief and skin protection.

## 2024-01-12 NOTE — Progress Notes (Signed)
 Subjective:     Patient ID: Stephen Evans, male    DOB: October 24, 1945, 78 y.o.   MRN: [id number]  Chief Complaint  Patient presents with   Conjunctivitis    Patient complains of eye pain, irritation and discharge for 3 days    Conjunctivitis     Discussed the use of AI scribe software for clinical note transcription with the patient, who gave verbal consent to proceed.  History of Present Illness   Stephen Evans is a 78 year old male who presents with eye irritation and discharge.  He has experienced significant eye irritation and discharge since Friday. The discharge is mucous-like, causing difficulty opening his eyes in the morning due to crusting, with pain around the edges of his eyes. There are no changes in vision. He suspects a connection to his sinus issues.  Chronic sinus congestion has been a persistent issue. An ENT specialist previously identified a polyp in his right nostril and prescribed prednisone  and Trialtris. He completed the prednisone  course without relief and is scheduled for a follow-up with the ENT next Thursday.  He started using Mucinex yesterday, which has improved his sleep and slightly alleviated symptoms. He has not used antihistamines recently but has tried them in the past without significant relief. He has leftover Singulair  (montelukast ) but found it ineffective for congestion. An antibiotic in May for similar nasal symptoms did not resolve the issue.    Health Maintenance Due  Topic Date Due   Hepatitis C Screening  Never done   Zoster Vaccines- Shingrix  (1 of 2) Never done   Medicare Annual Wellness (AWV)  08/17/2015   COVID-19 Vaccine (7 - Pfizer risk 2024-25 season) 10/30/2023    Past Medical History:  Diagnosis Date   Goals of care, counseling/discussion 05/28/2020   Melanoma (HCC) x 2   Melanoma (HCC)    Melanoma (HCC)    Pancreas cancer (HCC) 05/28/2020   Pancreatitis 06/2018   thought to be due to alcohol   Testicular cancer [facility])  2011   testicular--radiation   Testicular tumor     Past Surgical History:  Procedure Laterality Date   BIOPSY  01/18/2020   Procedure: BIOPSY;  Surgeon: Normie Becton., MD;  Location: [facility];  Service: Gastroenterology;;   COLONOSCOPY  11/01/2003   ESOPHAGOGASTRODUODENOSCOPY (EGD) WITH PROPOFOL  N/A 01/18/2020   Procedure: ESOPHAGOGASTRODUODENOSCOPY (EGD) WITH PROPOFOL ;  Surgeon: Normie Becton., MD;  Location: [facility];  Service: Gastroenterology;  Laterality: N/A;   EUS N/A 01/18/2020   Procedure: UPPER ENDOSCOPIC ULTRASOUND (EUS) LINEAR;  Surgeon: Normie Becton., MD;  Location: [facility];  Service: Gastroenterology;  Laterality: N/A;   FINE NEEDLE ASPIRATION N/A 01/18/2020   Procedure: FINE NEEDLE ASPIRATION (FNA) LINEAR;  Surgeon: Normie Becton., MD;  Location: [facility];  Service: Gastroenterology;  Laterality: N/A;   LYMPH GLAND EXCISION Right 1979   axillary, benign   MELANOMA EXCISION Right 03/2014   elbow   ORCHIECTOMY Right 2001   followed by radiation   TONSILLECTOMY  1959   wide excision of melanoma on back  1979    Family History  Problem Relation Age of Onset   Epilepsy Mother    Esophageal cancer Father 64       smoking/drinking hx   Breast cancer Sister 23   Alcohol abuse Brother    Cancer Maternal Uncle        unknown type; d. late 43s   Testicular cancer Maternal Grandfather  unknown age   Colon cancer Neg Hx    Colon polyps Neg Hx    Rectal cancer Neg Hx    Stomach cancer Neg Hx    Pancreatic cancer Neg Hx     Social History   Socioeconomic History   Marital status: Married    Spouse name: Not on file   Number of children: 2   Years of education: Not on file   Highest education level: Bachelor's degree (e.g., BA, AB, BS)  Occupational History   Occupation: retired    Comment: Associate Professor  Tobacco Use   Smoking status: Former    Current packs/day: 0.00    Types: Cigarettes     Start date: 09/07/1967    Quit date: 09/06/1997    Years since quitting: 26.3   Smokeless tobacco: Never  Vaping Use   Vaping status: Never Used  Substance and Sexual Activity   Alcohol use: Yes    Comment: 1 every 2 weeks   Drug use: No   Sexual activity: Not Currently  Other Topics Concern   Not on file  Social History Narrative   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 [employer]   Married- second marriage x 30 yrs   53 yr old son- lives in [place], [place] manages a private hedge fund   37 yr old daughter- lives in [place], [place]   Enjoys golf   Social Drivers of Health   Financial Resource Strain: Low Risk  (01/11/2024)   Overall Financial Resource Strain (CARDIA)    Difficulty of Paying Living Expenses: Not hard at all  Food Insecurity: No Food Insecurity (01/11/2024)   Hunger Vital Sign    Worried About Running Out of Food in the Last Year: Never true    Ran Out of Food in the Last Year: Never true  Transportation Needs: No Transportation Needs (01/11/2024)   PRAPARE - Administrator, Civil Service (Medical): No    Lack of Transportation (Non-Medical): No  Physical Activity: Insufficiently Active (01/11/2024)   Exercise Vital Sign    Days of Exercise per Week: 2 days    Minutes of Exercise per Session: 60 min  Stress: No Stress Concern Present (01/11/2024)   [employer] of Occupational Health - Occupational Stress Questionnaire    Feeling of Stress: Not at all  Social Connections: Moderately Integrated (01/11/2024)   Social Connection and Isolation Panel    Frequency of Communication with Friends and Family: More than three times a week    Frequency of Social Gatherings with Friends and Family: Twice a week    Attends Religious Services: 1 to 4 times per year    Active Member of [employer] or Organizations: No    Attends Engineer, structural: Not on file    Marital Status: Married  Catering manager  Violence: Not on file    Outpatient Medications Prior to Visit  Medication Sig Dispense Refill   CREON  36000-114000 units CPEP capsule Take 36,000 Units by mouth. 08/07/2023 May take up to 7 times per day.     Multiple Vitamin (MULTIVITAMIN) tablet Take 1 tablet by mouth daily.     tamsulosin (FLOMAX) 0.4 MG CAPS capsule Take by mouth daily.     Testosterone  10 MG/ACT (2%) GEL Apply 1 application  topically daily.     valACYclovir  (VALTREX ) 1000 MG tablet Take 1,000 mg by mouth 2 (two) times daily.     montelukast  (SINGULAIR ) 10 MG tablet  Take 1 tablet (10 mg total) by mouth at bedtime. 90 tablet 0   No facility-administered medications prior to visit.    No Active Allergies   ROS    See HPI Objective:    Physical Exam Constitutional:      General: He is not in acute distress.    Appearance: He is well-developed.  HENT:     Head: Normocephalic and atraumatic.   Eyes:     Extraocular Movements: Extraocular movements intact.     Conjunctiva/sclera:     Right eye: Right conjunctiva is injected. Exudate present. No hemorrhage.    Left eye: Left conjunctiva is injected. Exudate present. No hemorrhage.    Comments: Skin irritation noted upper/lower lids   Cardiovascular:     Rate and Rhythm: Normal rate and regular rhythm.     Heart sounds: No murmur heard. Pulmonary:     Effort: Pulmonary effort is normal. No respiratory distress.     Breath sounds: Normal breath sounds. No wheezing or rales.   Skin:    General: Skin is warm and dry.   Neurological:     Mental Status: He is alert and oriented to person, place, and time.   Psychiatric:        Behavior: Behavior normal.        Thought Content: Thought content normal.       BP 105/65 (BP Location: Right Arm, Patient Position: Sitting, Cuff Size: Normal)   Pulse 73   Temp 98.4 F (36.9 C) (Oral)   Resp 16   Ht 5' 7 (1.702 m)   Wt 209 lb (94.8 kg)   SpO2 97%   BMI 32.73 kg/m  Wt Readings from Last 3  Encounters:  01/12/24 209 lb (94.8 kg)  12/09/23 211 lb (95.7 kg)  11/06/23 212 lb (96.2 kg)       Assessment & Plan:   Problem List Items Addressed This Visit       Unprioritized   Conjunctivitis of both eyes - Primary    Symptoms suggest allergic conjunctivitis, but bacterial conjunctivitis cannot be excluded without treatment response. - Prescribe antibiotic eye drops for 5 days. - If no improvement, recommend OTC allergy eye drops. - Advise Aquaphor to eyelids for irritation relief and skin protection.      Relevant Medications   neomycin-polymyxin b-dexamethasone (MAXITROL) 3.5-10000-0.1 SUSP   loratadine (CLARITIN) 10 MG tablet   Allergic rhinitis    Chronic nasal congestion with nasal polyp, previously evaluated by ENT. Prednisone  ineffective as prescribed by ENT. - Continue Ryaltris nasal spray as prescribed. - Add Claritin for allergy relief. - No improvement noted with singulair  so patient discontinued. - Continue Mucinex as he has found some relief with this. - Follow up with ENT for MRI of sinuses as scheduled.       Relevant Medications   loratadine (CLARITIN) 10 MG tablet   Olopatadine-Mometasone (RYALTRIS) 665-25 MCG/ACT SUSP    I have discontinued Santanna K. Maradiaga's montelukast . I am also having him start on neomycin-polymyxin b-dexamethasone, loratadine, and Ryaltris. Additionally, I am having him maintain his multivitamin, Testosterone , Creon , tamsulosin, and valACYclovir .  Meds ordered this encounter  Medications   neomycin-polymyxin b-dexamethasone (MAXITROL) 3.5-10000-0.1 SUSP    Sig: Place 1 drop into both eyes every 6 (six) hours for 5 days.    Dispense:  5 mL    Refill:  0    Supervising Provider:   Randie Bustle A [[id number]]   loratadine (CLARITIN) 10 MG tablet    Sig:  Take 1 tablet (10 mg total) by mouth daily.    Supervising Provider:   Randie Bustle A [[id number]]   Olopatadine-Mometasone (RYALTRIS) 665-25 MCG/ACT SUSP    Sig: Place 2 sprays into  the nose 2 (two) times daily.    Supervising Provider:   Randie Bustle A [phone]

## 2024-01-12 NOTE — Patient Instructions (Signed)
 VISIT SUMMARY:  You visited us  today due to significant eye irritation and discharge, which you have been experiencing since Friday. We also discussed your ongoing chronic nasal congestion and nasal polyp issues, as well as your history of pancreatic cancer.  YOUR PLAN:  CONJUNCTIVITIS: You have symptoms that suggest allergic conjunctivitis, but we cannot rule out bacterial conjunctivitis without seeing how you respond to treatment. -Use the prescribed antibiotic eye drops for 5 days. -If there is no improvement, try over-the-counter allergy eye drops. -Apply Aquaphor around your eyes to protect the skin.  CHRONIC NASAL CONGESTION WITH NASAL POLYP: You have chronic nasal congestion and a nasal polyp, which have been previously evaluated by an ENT specialist. Prednisone  was ineffective and caused adverse reactions. -Continue using your nasal spray as prescribed. -Start taking Claritin for allergy relief. -Continue using Mucinex. -Follow up with your ENT specialist and get the CT scan as scheduled.

## 2024-01-22 DIAGNOSIS — J329 Chronic sinusitis, unspecified: Secondary | ICD-10-CM | POA: Diagnosis not present

## 2024-01-22 DIAGNOSIS — J339 Nasal polyp, unspecified: Secondary | ICD-10-CM | POA: Diagnosis not present

## 2024-01-28 DIAGNOSIS — B880 Other acariasis: Secondary | ICD-10-CM | POA: Diagnosis not present

## 2024-02-10 DIAGNOSIS — R5383 Other fatigue: Secondary | ICD-10-CM | POA: Diagnosis not present

## 2024-02-10 DIAGNOSIS — C259 Malignant neoplasm of pancreas, unspecified: Secondary | ICD-10-CM | POA: Diagnosis not present

## 2024-02-10 DIAGNOSIS — Z8507 Personal history of malignant neoplasm of pancreas: Secondary | ICD-10-CM | POA: Diagnosis not present

## 2024-02-10 DIAGNOSIS — C250 Malignant neoplasm of head of pancreas: Secondary | ICD-10-CM | POA: Diagnosis not present

## 2024-02-10 DIAGNOSIS — Z08 Encounter for follow-up examination after completed treatment for malignant neoplasm: Secondary | ICD-10-CM | POA: Diagnosis not present

## 2024-02-18 DIAGNOSIS — E291 Testicular hypofunction: Secondary | ICD-10-CM | POA: Diagnosis not present

## 2024-02-24 DIAGNOSIS — E291 Testicular hypofunction: Secondary | ICD-10-CM | POA: Diagnosis not present

## 2024-02-24 DIAGNOSIS — N401 Enlarged prostate with lower urinary tract symptoms: Secondary | ICD-10-CM | POA: Diagnosis not present

## 2024-02-24 DIAGNOSIS — R350 Frequency of micturition: Secondary | ICD-10-CM | POA: Diagnosis not present

## 2024-02-24 DIAGNOSIS — N138 Other obstructive and reflux uropathy: Secondary | ICD-10-CM | POA: Diagnosis not present

## 2024-03-04 ENCOUNTER — Other Ambulatory Visit: Payer: Self-pay

## 2024-03-04 ENCOUNTER — Telehealth: Payer: Self-pay | Admitting: *Deleted

## 2024-03-04 ENCOUNTER — Encounter: Payer: Self-pay | Admitting: *Deleted

## 2024-03-04 ENCOUNTER — Inpatient Hospital Stay (HOSPITAL_BASED_OUTPATIENT_CLINIC_OR_DEPARTMENT_OTHER): Admitting: Hematology & Oncology

## 2024-03-04 ENCOUNTER — Inpatient Hospital Stay: Attending: Hematology & Oncology

## 2024-03-04 VITALS — BP 121/47 | HR 66 | Temp 98.0°F | Resp 18 | Ht 67.0 in | Wt 210.0 lb

## 2024-03-04 DIAGNOSIS — Z79899 Other long term (current) drug therapy: Secondary | ICD-10-CM | POA: Insufficient documentation

## 2024-03-04 DIAGNOSIS — R5383 Other fatigue: Secondary | ICD-10-CM

## 2024-03-04 DIAGNOSIS — E291 Testicular hypofunction: Secondary | ICD-10-CM

## 2024-03-04 DIAGNOSIS — C251 Malignant neoplasm of body of pancreas: Secondary | ICD-10-CM

## 2024-03-04 DIAGNOSIS — Z8507 Personal history of malignant neoplasm of pancreas: Secondary | ICD-10-CM | POA: Diagnosis present

## 2024-03-04 LAB — CMP (CANCER CENTER ONLY)
ALT: 15 U/L (ref 0–44)
AST: 15 U/L (ref 15–41)
Albumin: 4.2 g/dL (ref 3.5–5.0)
Alkaline Phosphatase: 94 U/L (ref 38–126)
Anion gap: 10 (ref 5–15)
BUN: 13 mg/dL (ref 8–23)
CO2: 27 mmol/L (ref 22–32)
Calcium: 9.2 mg/dL (ref 8.9–10.3)
Chloride: 109 mmol/L (ref 98–111)
Creatinine: 0.98 mg/dL (ref 0.61–1.24)
GFR, Estimated: >60 mL/min (ref >60)
Glucose, Bld: 133 mg/dL — ABNORMAL HIGH (ref 70–99)
Potassium: 4.4 mmol/L (ref 3.5–5.1)
Sodium: 147 mmol/L — ABNORMAL HIGH (ref 135–145)
Total Bilirubin: 0.5 mg/dL (ref 0.0–1.2)
Total Protein: 6.9 g/dL (ref 6.5–8.1)

## 2024-03-04 LAB — CBC WITH DIFFERENTIAL (CANCER CENTER ONLY)
Abs Immature Granulocytes: 0.01 K/uL (ref 0.00–0.07)
Basophils Absolute: 0.1 K/uL (ref 0.0–0.1)
Basophils Relative: 1 %
Eosinophils Absolute: 0.5 K/uL (ref 0.0–0.5)
Eosinophils Relative: 7 %
HCT: 44.1 % (ref 39.0–52.0)
Hemoglobin: 14.6 g/dL (ref 13.0–17.0)
Immature Granulocytes: 0 %
Lymphocytes Relative: 26 %
Lymphs Abs: 1.7 K/uL (ref 0.7–4.0)
MCH: 30.3 pg (ref 26.0–34.0)
MCHC: 33.1 g/dL (ref 30.0–36.0)
MCV: 91.5 fL (ref 80.0–100.0)
Monocytes Absolute: 0.4 K/uL (ref 0.1–1.0)
Monocytes Relative: 6 %
Neutro Abs: 3.7 K/uL (ref 1.7–7.7)
Neutrophils Relative %: 60 %
Platelet Count: 233 K/uL (ref 150–400)
RBC: 4.82 MIL/uL (ref 4.22–5.81)
RDW: 13.2 % (ref 11.5–15.5)
WBC Count: 6.3 K/uL (ref 4.0–10.5)
nRBC: 0 % (ref 0.0–0.2)

## 2024-03-04 LAB — TSH: TSH: 1.52 u[IU]/mL (ref 0.350–4.500)

## 2024-03-04 NOTE — Progress Notes (Signed)
 Hematology and Oncology Follow Up Visit  Stephen Evans [id number] 02-27-1946 78 y.o. 03/04/2024   Principle Diagnosis:  Stage Ia (T1cN0M0) adenocarcinoma of the pancreas-pancreas body  Current Therapy:   Status post Whipple procedure on 04/11/2020     Interim History:  Stephen Evans is back for follow-up.  We last saw him back in April.  The big news is that he and his wife are about to move down in [place], [place] .  I think that they will be moving in a month.  They already sold their house appear.  They are looking forward to moving down to [place] where they can walk a lot more.  There is a lot more things to do.  They will be close to the airport where they can travel extensively.  I hate that they are going to be moving away from us .  We have had such a great time with them.  He has been such a inspiration.  He has been through so much.  Is now been 4 years since he underwent his Whipple procedure.  Thankfully, there has been no evidence of recurrence of his pancreatic cancer.  He looks fantastic.  He has had no problems with abdominal pain.  There is been no change in bowel or bladder habits.  He has had no cough.  He has had no rashes.  There is been no leg swelling.  He gets his scans and an MRI at [facility].  The scans were also done on 02/10/2024.  Everything looks fantastic on the MRI of the abdomen and CT scan of the chest.  His last CA 19-9 which he drew back in April was 24.  He has had no bleeding.  There is been no headache.  He has had no issues with COVID.  Overall, I would have to say that his performance status is probably ECOG 0.    Medications:  Current Outpatient Medications:    CREON  36000-114000 units CPEP capsule, Take 36,000 Units by mouth. 08/07/2023 May take up to 7 times per day., Disp: , Rfl:    Multiple Vitamin (MULTIVITAMIN) tablet, Take 1 tablet by mouth daily., Disp: , Rfl:    tamsulosin (FLOMAX) 0.4 MG CAPS capsule, Take by mouth daily., Disp: , Rfl:     Testosterone  10 MG/ACT (2%) GEL, Apply 1 application  topically daily., Disp: , Rfl:   Allergies:  No Active Allergies   Past Medical History, Surgical history, Social history, and Family History were reviewed and updated.  Review of Systems: Review of Systems  Constitutional: Negative.   HENT:  Negative.    Eyes: Negative.   Respiratory: Negative.    Cardiovascular: Negative.   Gastrointestinal:  Positive for abdominal pain.  Endocrine: Negative.   Genitourinary: Negative.    Musculoskeletal: Negative.   Skin: Negative.   Neurological: Negative.   Hematological: Negative.   Psychiatric/Behavioral: Negative.      Physical Exam:  height is 5' 7 (1.702 m) and weight is 210 lb (95.3 kg). His oral temperature is 98 F (36.7 C). His blood pressure is 121/47 (abnormal) and his pulse is 66. His respiration is 18 and oxygen saturation is 98%.   Wt Readings from Last 3 Encounters:  03/04/24 210 lb (95.3 kg)  01/12/24 209 lb (94.8 kg)  12/09/23 211 lb (95.7 kg)    Physical Exam Vitals reviewed.  HENT:     Head: Normocephalic and atraumatic.  Eyes:     Pupils: Pupils are equal, round, and reactive to light.  Cardiovascular:     Rate and Rhythm: Normal rate and regular rhythm.     Heart sounds: Normal heart sounds.  Pulmonary:     Effort: Pulmonary effort is normal.     Breath sounds: Normal breath sounds.  Abdominal:     General: Bowel sounds are normal.     Palpations: Abdomen is soft.     Comments: Abdominal exam shows a soft abdomen.  Bowel sounds are present.  Has well healed laparotomy scar in the midline.  He does not have a keloid formation.  There is no fluid wave.  He has little bit of tenderness throughout the abdomen to palpation.  He has no rebound tenderness.  There is no obvious fluid wave.  I cannot palpate his liver or spleen tip.  Musculoskeletal:        General: No tenderness or deformity. Normal range of motion.     Cervical back: Normal range of  motion.  Lymphadenopathy:     Cervical: No cervical adenopathy.  Skin:    General: Skin is warm and dry.     Findings: No erythema or rash.  Neurological:     Mental Status: He is alert and oriented to person, place, and time.  Psychiatric:        Behavior: Behavior normal.        Thought Content: Thought content normal.        Judgment: Judgment normal.      Lab Results  Component Value Date   WBC 6.3 03/04/2024   HGB 14.6 03/04/2024   HCT 44.1 03/04/2024   MCV 91.5 03/04/2024   PLT 233 03/04/2024     Chemistry      Component Value Date/Time   NA 147 (H) 03/04/2024 0904   K 4.4 03/04/2024 0904   CL 109 03/04/2024 0904   CO2 27 03/04/2024 0904   BUN 13 03/04/2024 0904   CREATININE 0.98 03/04/2024 0904   CREATININE 0.90 05/21/2018 1606      Component Value Date/Time   CALCIUM 9.2 03/04/2024 0904   ALKPHOS 94 03/04/2024 0904   AST 15 03/04/2024 0904   ALT 15 03/04/2024 0904   BILITOT 0.5 03/04/2024 0904      Impression and Plan: Stephen Evans is a very nice 46 year old white male.  Again he is very interesting to talk to.  He is originally from [place] .  He worked for [employer].  He was in the [employer].  Again, they will be moving down to [facility], [place] .  I know that they can have follow-up at the [facility] which is an outstanding facility for him.  We can certainly help make a referral down there.  I would have believe that after 4 years, the chance of his cancer coming back should be less than 10%.  Also happy that he has had a great quality of life.  He has been able to travel with his wife.  In fact, there will be heading down to [place] for a couple weeks.  They are going to try to get this in before they move.  Again, it has been a privilege to be able to help him.  He has served our country.  He is a true American hero.  Again, he has such an inspiration.  I know that he will do well down in [place] .  8/8/202510:08 AM

## 2024-03-04 NOTE — Telephone Encounter (Signed)
 Received call from [facility] that they received a referral for this patient. [facility] is not able to accept this referral because they do not accept his insurance, Medicare/Medicaid.  Return number is [phone].

## 2024-03-04 NOTE — Progress Notes (Signed)
 Per Dr Timmy, send referral to [facility] in [place]  because patient will be moving there. Order placed and records routed.   [facility] [address] [place], [place] [address] Phone: [phone] Fax: [phone]  Oncology Nurse Navigator Documentation     03/04/2024   11:30 AM  Oncology Nurse Navigator Flowsheets  Navigator Location [facility]  Interventions Referrals  Referrals Medical Oncology  Time Spent with Patient 30

## 2024-03-05 ENCOUNTER — Ambulatory Visit: Payer: Self-pay | Admitting: Hematology & Oncology

## 2024-03-05 LAB — CANCER ANTIGEN 19-9: CA 19-9: 26 U/mL (ref 0–35)

## 2024-03-07 ENCOUNTER — Encounter: Payer: Self-pay | Admitting: *Deleted

## 2024-03-10 DIAGNOSIS — D3131 Benign neoplasm of right choroid: Secondary | ICD-10-CM | POA: Diagnosis not present

## 2024-03-29 ENCOUNTER — Telehealth: Payer: Self-pay | Admitting: Family

## 2024-03-29 NOTE — Telephone Encounter (Signed)
 Copied from CRM #[id number]. Topic: Medicare AWV >> Mar 29, 2024 10:31 AM Nathanel DEL wrote: Reason for CRM: Called LVM 03/29/2024 to schedule AWV. Please schedule office or virtual visits.  Nathanel Paschal; Care Guide Ambulatory Clinical Support [facility] Direct Dial: [phone]

## 2024-04-26 ENCOUNTER — Encounter: Payer: Self-pay | Admitting: Family
